# Patient Record
Sex: Male | Born: 1980
Health system: Southern US, Community
[De-identification: ages and names within clinical notes are randomized; demographics above are authoritative.]

## PROBLEM LIST (undated history)

## (undated) DIAGNOSIS — B20 Human immunodeficiency virus [HIV] disease: Secondary | ICD-10-CM

## (undated) DIAGNOSIS — B370 Candidal stomatitis: Secondary | ICD-10-CM

## (undated) DIAGNOSIS — B029 Zoster without complications: Secondary | ICD-10-CM

## (undated) DIAGNOSIS — R21 Rash and other nonspecific skin eruption: Secondary | ICD-10-CM

## (undated) DIAGNOSIS — J439 Emphysema, unspecified: Secondary | ICD-10-CM

## (undated) DIAGNOSIS — F17200 Nicotine dependence, unspecified, uncomplicated: Secondary | ICD-10-CM

## (undated) HISTORY — DX: Human immunodeficiency virus (HIV) disease: B20

## (undated) HISTORY — DX: Zoster without complications: B02.9

## (undated) HISTORY — DX: Nicotine dependence, unspecified, uncomplicated: F17.200

## (undated) HISTORY — DX: Rash and other nonspecific skin eruption: R21

## (undated) HISTORY — DX: Emphysema, unspecified: J43.9

## (undated) HISTORY — DX: Candidal stomatitis: B37.0

---

## 2006-12-19 ENCOUNTER — Emergency Department (HOSPITAL_COMMUNITY): Admission: EM | Admit: 2006-12-19 | Discharge: 2006-12-19 | Payer: Self-pay | Admitting: Emergency Medicine

## 2007-01-07 ENCOUNTER — Emergency Department (HOSPITAL_COMMUNITY): Admission: EM | Admit: 2007-01-07 | Discharge: 2007-01-07 | Payer: Self-pay | Admitting: Emergency Medicine

## 2007-01-10 ENCOUNTER — Emergency Department (HOSPITAL_COMMUNITY): Admission: EM | Admit: 2007-01-10 | Discharge: 2007-01-10 | Payer: Self-pay | Admitting: *Deleted

## 2007-09-11 ENCOUNTER — Emergency Department (HOSPITAL_COMMUNITY): Admission: EM | Admit: 2007-09-11 | Discharge: 2007-09-11 | Payer: Self-pay | Admitting: Emergency Medicine

## 2010-02-16 ENCOUNTER — Encounter: Payer: Self-pay | Admitting: Internal Medicine

## 2010-02-16 LAB — CONVERTED CEMR LAB
CD4 T Helper %: 16.4 %
Hemoglobin: 14.4 g/dL
Platelets: 170 10*3/uL
WBC: 5.3 10*3/uL

## 2010-03-06 ENCOUNTER — Telehealth: Payer: Self-pay

## 2010-03-06 DIAGNOSIS — B20 Human immunodeficiency virus [HIV] disease: Secondary | ICD-10-CM | POA: Insufficient documentation

## 2010-03-13 ENCOUNTER — Encounter (INDEPENDENT_AMBULATORY_CARE_PROVIDER_SITE_OTHER): Payer: Self-pay | Admitting: *Deleted

## 2010-03-13 ENCOUNTER — Ambulatory Visit: Payer: Self-pay | Admitting: Internal Medicine

## 2010-03-13 LAB — CONVERTED CEMR LAB
ALT: 14 units/L (ref 0–53)
AST: 22 units/L (ref 0–37)
CO2: 20 meq/L (ref 19–32)
Calcium: 9.4 mg/dL (ref 8.4–10.5)
Chloride: 107 meq/L (ref 96–112)
Creatinine, Ser: 1 mg/dL (ref 0.40–1.50)
Eosinophils Absolute: 0.1 10*3/uL (ref 0.0–0.7)
HIV 1 RNA Quant: 18700 copies/mL — ABNORMAL HIGH (ref <48)
HIV-1 antibody: POSITIVE — AB
HIV-2 Ab: NEGATIVE
Hep A Total Ab: NEGATIVE
Hepatitis B Surface Ag: NEGATIVE
Lymphocytes Relative: 47 % — ABNORMAL HIGH (ref 12–46)
Lymphs Abs: 2.3 10*3/uL (ref 0.7–4.0)
MCV: 86.7 fL (ref 78.0–100.0)
Monocytes Relative: 9 % (ref 3–12)
Neutro Abs: 2 10*3/uL (ref 1.7–7.7)
Neutrophils Relative %: 41 % — ABNORMAL LOW (ref 43–77)
Platelets: 191 10*3/uL (ref 150–400)
Potassium: 4.3 meq/L (ref 3.5–5.3)
RBC: 4.75 M/uL (ref 4.22–5.81)
Sodium: 139 meq/L (ref 135–145)
Total Protein: 7.9 g/dL (ref 6.0–8.3)
WBC: 4.9 10*3/uL (ref 4.0–10.5)

## 2010-03-28 ENCOUNTER — Ambulatory Visit: Payer: Self-pay | Admitting: Internal Medicine

## 2010-06-05 ENCOUNTER — Ambulatory Visit: Payer: Self-pay | Admitting: Internal Medicine

## 2010-06-05 LAB — CONVERTED CEMR LAB
Bilirubin Urine: NEGATIVE
Casts: NONE SEEN /lpf
Specific Gravity, Urine: 1.041 — ABNORMAL HIGH (ref 1.005–1.030)
Squamous Epithelial / LPF: NONE SEEN /lpf
Urine Glucose: NEGATIVE mg/dL
Urobilinogen, UA: 0.2 (ref 0.0–1.0)
pH: 5.5 (ref 5.0–8.0)

## 2010-06-06 ENCOUNTER — Encounter: Payer: Self-pay | Admitting: Internal Medicine

## 2010-06-06 LAB — CONVERTED CEMR LAB
ALT: 13 units/L (ref 0–53)
AST: 21 units/L (ref 0–37)
Alkaline Phosphatase: 92 units/L (ref 39–117)
Basophils Absolute: 0 10*3/uL (ref 0.0–0.1)
Basophils Relative: 0 % (ref 0–1)
Creatinine, Ser: 1.21 mg/dL (ref 0.40–1.50)
Eosinophils Relative: 1 % (ref 0–5)
HCT: 38.4 % — ABNORMAL LOW (ref 39.0–52.0)
Hemoglobin: 13.3 g/dL (ref 13.0–17.0)
MCHC: 34.6 g/dL (ref 30.0–36.0)
Monocytes Absolute: 0.8 10*3/uL (ref 0.1–1.0)
Platelets: 180 10*3/uL (ref 150–400)
RDW: 14.9 % (ref 11.5–15.5)
Sodium: 126 meq/L — ABNORMAL LOW (ref 135–145)
Total Bilirubin: 0.4 mg/dL (ref 0.3–1.2)
Total Protein: 7.3 g/dL (ref 6.0–8.3)

## 2010-06-07 ENCOUNTER — Ambulatory Visit: Payer: Self-pay | Admitting: Internal Medicine

## 2010-06-07 LAB — CONVERTED CEMR LAB
BUN: 9 mg/dL (ref 6–23)
CO2: 23 meq/L (ref 19–32)
Calcium: 8.8 mg/dL (ref 8.4–10.5)
Glucose, Bld: 112 mg/dL — ABNORMAL HIGH (ref 70–99)
Sodium: 139 meq/L (ref 135–145)

## 2010-06-21 ENCOUNTER — Ambulatory Visit: Payer: Self-pay | Admitting: Internal Medicine

## 2010-07-02 ENCOUNTER — Telehealth (INDEPENDENT_AMBULATORY_CARE_PROVIDER_SITE_OTHER): Payer: Self-pay | Admitting: *Deleted

## 2010-08-23 ENCOUNTER — Telehealth (INDEPENDENT_AMBULATORY_CARE_PROVIDER_SITE_OTHER): Payer: Self-pay | Admitting: *Deleted

## 2010-09-04 ENCOUNTER — Ambulatory Visit: Payer: Self-pay | Admitting: Internal Medicine

## 2010-09-05 ENCOUNTER — Encounter: Payer: Self-pay | Admitting: Internal Medicine

## 2010-09-05 LAB — CONVERTED CEMR LAB
ALT: 14 units/L (ref 0–53)
CO2: 25 meq/L (ref 19–32)
Creatinine, Ser: 1.09 mg/dL (ref 0.40–1.50)
Eosinophils Absolute: 0 10*3/uL (ref 0.0–0.7)
Eosinophils Relative: 1 % (ref 0–5)
HCT: 40 % (ref 39.0–52.0)
Lymphs Abs: 1.7 10*3/uL (ref 0.7–4.0)
MCV: 85.8 fL (ref 78.0–100.0)
Monocytes Relative: 8 % (ref 3–12)
Platelets: 185 10*3/uL (ref 150–400)
RBC: 4.66 M/uL (ref 4.22–5.81)
Total Bilirubin: 0.3 mg/dL (ref 0.3–1.2)
WBC: 4 10*3/uL (ref 4.0–10.5)

## 2010-09-18 ENCOUNTER — Ambulatory Visit
Admission: RE | Admit: 2010-09-18 | Discharge: 2010-09-18 | Payer: Self-pay | Source: Home / Self Care | Attending: Internal Medicine | Admitting: Internal Medicine

## 2010-10-09 NOTE — Progress Notes (Signed)
Summary: pt calling to reschedule intake and OV  Phone Note Outgoing Call   Call placed by: Tomasita Morrow RN,  March 06, 2010 11:45 AM Summary of Call: [facility] Referral. Pt missed Intake Appt scheduled for 03-06-10.    Pt called and requested reschedule.  Tomasita Morrow RN  March 08, 2010 10:17 AM   Follow-up for Phone Call        Rescheduled for 03-26-10 @ 2pm with K. Vollmer  03-13-10 for intake @ 11:00 am  Tomasita Morrow RN  March 08, 2010 4:12 PM   New Problems: HIV INFECTION (ICD-042)   New Problems: HIV INFECTION (ICD-042)

## 2010-10-09 NOTE — Miscellaneous (Signed)
Summary: clinical update/ryan white [place]ADAP partially compl pend. labs  Clinical Lists Changes  Observations: Added new observation of RWTITLE: B (03/13/2010 13:48) Added new observation of PAYOR: No Insurance (03/13/2010 13:48) Added new observation of AIDSDAP: Pending (03/13/2010 13:48) Added new observation of PCTFPL: 212.74  (03/13/2010 13:48) Added new observation of INCOMESOURCE: wages  (03/13/2010 13:48) Added new observation of HOUSEINCOME: 53664  (03/13/2010 13:48) Added new observation of #CHILD<18 IN: No  (03/13/2010 13:48) Added new observation of FAMILYSIZE: 1  (03/13/2010 13:48) Added new observation of HOUSING: Stable/permanent  (03/13/2010 13:48) Added new observation of FINASSESSDT: 03/13/2010  (03/13/2010 13:48) Added new observation of YEARLYEXPEN: 2800  (03/13/2010 13:48) Added new observation of MARITAL STAT: Single  (03/13/2010 13:48) Added new observation of LATINO/HISP: No  (03/13/2010 13:48) Added new observation of RACE: African American  (03/13/2010 13:48) Added new observation of REC_MESSAGE: Yes  (03/13/2010 13:48) Added new observation of RECPHONECALL: Yes  (03/13/2010 13:48) Added new observation of REC_MAIL: Yes  (03/13/2010 13:48) Added new observation of RW VITAL STA: Active  (03/13/2010 13:48) Added new observation of PATNTCOUNTY: [place]  (03/13/2010 13:48) Added new observation of RWPARTICIP: Yes  (03/13/2010 13:48)

## 2010-10-09 NOTE — Progress Notes (Signed)
Summary: [facility] pt. assistance med. arrived  Phone Note Outgoing Call   Call placed by: Annice Pih Summary of Call: Pt. notified that meds. have arrived from [place] pt. assistance program, Atripla for October. Initial call taken by: Wendall Mola CMA Duncan Dull),  July 02, 2010 9:16 AM

## 2010-10-09 NOTE — Miscellaneous (Signed)
Summary: Orders Update  Clinical Lists Changes  Orders: Added new Test order of T-Comprehensive Metabolic Panel [id number]) - Signed     Process Orders Check Orders Results:     [facility]: ABN not required for this insurance Tests Sent for requisitioning (June 06, 2010 12:01 PM):     06/06/2010: [facility] -- T-Comprehensive Metabolic Panel [phone] (signed)   Appended Document: Orders Update order changed to BMP

## 2010-10-09 NOTE — Assessment & Plan Note (Signed)
Summary: New  042    CC:  new patient to establish, lab results, c/o sorethroat off and on, and has had blood in saliva.  History of Present Illness: This is the first ID clinic visit for Jeremy Harper.  He was diagnosed HIV (+) 5/11.  His last negative test was in 2006.  His risk factor is heterosexual intercourse.  He is currently asymptomatic.  His current partner is HIV negative. He does c/o an intermittant sore throat. No fever or cough.  Preventive Screening-Counseling & Management  Alcohol-Tobacco     Alcohol drinks/day: occasionally     Smoking Status: current     Smoking Cessation Counseling: yes     Packs/Day: <0.25  Caffeine-Diet-Exercise     Caffeine use/day: occasional 2 per day     Does Patient Exercise: yes     Type of exercise: at work  Hep-HIV-STD-Contraception     Dental Visit-last 6 months no  Safety-Violence-Falls     Seat Belt Use: yes      Sexual History:  dating.        Drug Use:  current and occasional marijuana.    Comments: pt. declined condoms   Current Allergies (reviewed today): No known allergies  Social History: Drug Use:  current, occasional marijuana Sexual History:  dating  Review of Systems  The patient denies anorexia, fever, weight loss, prolonged cough, and headaches.    Vital Signs:  Patient profile:   30 year old male Height:      69 inches (175.26 cm) Weight:      171.8 pounds (78.09 kg) BMI:     25.46 Temp:     97.6 degrees F (36.44 degrees C) oral Pulse rate:   81 / minute BP sitting:   117 / 69  (right arm)  Vitals Entered By: Wendall Mola CMA Duncan Dull) (March 28, 2010 11:19 AM) CC: new patient to establish, lab results, c/o sorethroat off and on, has had blood in saliva Is Patient Diabetic? No Pain Assessment Patient in pain? no      Nutritional Status BMI of 25 - 29 = overweight Nutritional Status Detail appetite "normal"  Have you ever been in a relationship where you felt threatened, hurt or afraid?No     Does patient need assistance? Functional Status Self care Ambulation Normal   Physical Exam  General:  alert, well-developed, well-nourished, and well-hydrated.   Head:  normocephalic and atraumatic.   Mouth:  pharynx pink and moist, no erythema, and no exudates.   Neck:  no cervical lymphadenopathy.   Lungs:  normal breath sounds.   Heart:  normal rate and regular rhythm.     Impression & Recommendations:  Problem # 1:  HIV INFECTION (ICD-042) Discussed pathophysiology of HIV and the meaning of CD4ct and VL.  Pt.s current Cd4ct is 400  and VL is  18,700 with no resistance seen on genotype .  At this Point antiretroviral treatment is indicated . Discussed potential treatments.  Pt would like to start Atripla.  Potential side effects were discussed. I will refer him to Byrd Hesselbach to get enrolled in ADAP.  He will f/u about 6 weeks after starting meds for labs. I discussed the need to take his meds every day. Pt referred to [facility].  Discussed safe sex and transmisiion routes with the patient. Condoms offered.  Orders: New Patient Level III (99203)Future Orders: T-CBC w/Diff ([id number]) ... 05/29/2010 T-CD4SP ([facility]) (CD4SP) ... 05/29/2010 T-Comprehensive Metabolic Panel [phone]) ... 05/29/2010 T-HIV Viral Load [phone]) .Marland KitchenMarland Kitchen  05/29/2010  Medications Added to Medication List This Visit: 1)  Atripla 600-200-300 Mg Tabs (Efavirenz-emtricitab-tenofovir) .... Take 1 tablet by mouth at bedtime  Patient Instructions: 1)  Please schedule a follow-up appointment in 10 weeks, 2 weeks after labs.

## 2010-10-09 NOTE — Assessment & Plan Note (Signed)
Summary: NEW 042/TKK    Infectious Disease New Patient Intake Referring MD/Agency: [facility] Dept  Address: [address] [place], [place] [address]  Medical Records: Received Health Insurance / Payor: No Insurance Employer: [employer]       Do you have a Primary physician: No Are family members aware of patient's diagnosis?  If so, are they supportive? Mother is aware and somewhat supportive Describe patient's current social support (family, friends, support groups): Pt has not told any friends  Medical History Medical Problems OTHER than HIV: No   Medication Allergies: No   Medications: No    Family History Hypertension: Yes  Family Side: Paternal  Comments: stroke in 64's  Tobacco use: current Amt: 1/2 per week. Counseled to quit/cut down: yes  Behavioral Health Assessment Have you ever been diagnosed with depression or mental illness? No  Do you drink alcohol? Yes Frequency: occasionally Alcohol Beverage Type(s): beer  Do you use recreational drugs? Yes Last Date of Consumption: 02/07/2010 Frequency: rarely  Drugs: marijuana   once monthly  Do you feel you have a problem with drugs and/or alcohol? No   Have you ever been in a treatment facility for any addiction? No If you are currently using drugs and/or alcohol, would you like help for this addiction? No  HIV Intake Information When did you first test positive for HIV? 01/30/2010 Type of test Conducted: WB    Where was this test performed?  Name of Agency: [facility]   City/State: [place] , [place]  Was this your first time ever being tested or HIV? No   LAST negative HIV test result: 2006 Name of Office: [facility] Dept    City/state: [name],    Risk Factor(s) for HIV: Heterosexual contact  Method of Exposure to HIV: Heterosexual Intercourse Have you ever been hospitalized for any HIV-related condition? No  Have you ever been under the care of a  physician for being HIV positive? No  Newly Diagnosed Patients Has a Disease Intervention Specialist from the Health Department contacted the patient? Yes.   The patient has been informed that the [facility] Department will contact ALL newly reported cases. Health Department Contact:  [phone]   (SSN is needed for confirmation)  Reported Today: 03/13/2010 Health Department Contact:  [phone]            (SSN is needed for confirmation)  Person Reporting: prev/reported GHD Do you have any Non-HIV related medical conditions or other prior hospitalizations or surgeries? No  HIV Medications Information The patient is currently NOT taking any HIV medications.  Infection History  Patient has been diagnosed with the following opportunistic infections: Are there any other symptoms you need to discuss? No Have you received literature/education prior to this visit about HIV/AIDS? Yes Do you understand the meaning of a Viral Load? No Do you understand the meaning of a CD4 count? No Initial CD4 Result: 377 Date: 02/16/2010  Sexual History Are you in a current relationship? No Are you currently sexually active? Yes Was this protected intercourse? No Safe Sex Counseling/Pamphlet Given Sexual History Comments: Pt states he has been with 3 male partners within the last year. None of the three where sex workers . Each of those have been tested and are negative. He suspects he may have been infected by a previous partner in [place] over a year ago.  Evaluation and Follow-Up INTAKE CHECK LIST: HIV Education, Safe Sex Counseling, HIV Material Given,  Juanell Fairly Consent  Prevention For Positives: 03/13/2010   Safe sex practices discussed with patient. Condoms offered. Juanell Fairly Consent: Yes Are you in need of condoms at this time? No Our patient has been informed that condoms are always available in this clinic.   Brochure Provided for Above Organizations? Yes Name of Agency: [facility] Does patient  have problems that warrant Social Worker referral? No             Prevention For Positives: 03/13/2010   Safe sex practices discussed with patient. Condoms offered.                             Immunization History:  Hepatitis B Immunization History:    Hepatitis B # 1:  historical (07/25/1994)    Hepatitis B # 2:  historical (08/23/1994)    Hepatitis B # 3:  historical (02/07/1995)  DPT Immunization History:    DPT # 1:  historical (01/17/1988)  Polio Immunization History:    Polio # 1:  historical (01/17/1988)  MMR Immunization History:    MMR # 1:  historical (04/16/1994)  Tetanus/Td Immunization History:    Tetanus/Td:  historical (11/09/1997)    Tetanus/Td:  historical (02/16/2010)  Influenza Immunization History:    Influenza:  historical (02/16/2010)  Pneumovax Immunization History:    Pneumovax:  historical (02/16/2010)  PPD Results    Date of reading: 02/19/2010    Results: < 5mm    Interpretation: negative    -  Date:  02/16/2010    CD4: 377    CD4%: 16.4    Hemoglobin: 14.4    WBC: 5.3    Platelets: 170

## 2010-10-09 NOTE — Assessment & Plan Note (Signed)
Summary: F/U/VS   CC:  follow-up visit, lab results, pt. never started Atripla, and received a letter that he was on ADAP wait list.  History of Present Illness: Pt never started his Atripla because he was placed on the ADAP wait list and was unable to get it. Yesterday he was layed off from work.  Preventive Screening-Counseling & Management  Alcohol-Tobacco     Alcohol drinks/day: occasionally     Smoking Status: current     Smoking Cessation Counseling: yes     Packs/Day: <0.25  Caffeine-Diet-Exercise     Caffeine use/day: occasional 2 per day     Does Patient Exercise: yes     Type of exercise: at work  Hep-HIV-STD-Contraception     Dental Visit-last 6 months no  Safety-Violence-Falls     Seat Belt Use: yes      Sexual History:  dating.        Drug Use:  current and occasional marijuana.    Comments: pt. declined condoms   Current Allergies (reviewed today): No known allergies  Review of Systems  The patient denies anorexia, fever, and weight loss.    Vital Signs:  Patient profile:   30 year old male Height:      69 inches (175.26 cm) Weight:      165.2 pounds (75.09 kg) BMI:     24.48 Temp:     98.2 degrees F (36.78 degrees C) oral Pulse rate:   76 / minute BP sitting:   121 / 73  (left arm)  Vitals Entered By: Wendall Mola CMA Duncan Dull) (June 21, 2010 3:58 PM) CC: follow-up visit, lab results, pt. never started Atripla, received a letter that he was on ADAP wait list Is Patient Diabetic? No Pain Assessment Patient in pain? no      Nutritional Status BMI of 19 -24 = normal Nutritional Status Detail appetite "low"  Does patient need assistance? Functional Status Self care Ambulation Normal   Physical Exam  General:  alert, well-developed, well-nourished, and well-hydrated.   Head:  normocephalic and atraumatic.   Mouth:  pharynx pink and moist.  no thrush  Lungs:  normal breath sounds.     Impression &  Recommendations:  Problem # 1:  HIV INFECTION (ICD-042) I will have pt meet with our financial counselor to get a resource to pay for his Atripla.  Pt will call for a lab appt 6 weeks after starting meds. Diagnostics Reviewed:  HIV: REACTIVE (03/13/2010)   HIV-Western blot: Positive (03/13/2010)   CD4: 420 (06/06/2010)   WBC: 6.7 (06/05/2010)   Hgb: 13.3 (06/05/2010)   HCT: 38.4 (06/05/2010)   Platelets: 180 (06/05/2010) HIV genotype: See Comment (03/13/2010)   HIV-1 RNA: 47425 (06/05/2010)   HBSAg: NEG (03/13/2010)  Patient Instructions: 1)  call for follow-up lab appt 6 weeks after starting meds.

## 2010-10-11 NOTE — Assessment & Plan Note (Signed)
Summary: followup on labs/jc   CC:  follow-up visit, lab results, and c/o off and on chest pain.  History of Present Illness: patient is here for follow up his labs.  He states when he initially started as [place] made him very sleepy for almost 24 hours.  Recently he has adjusted to the medication and it is no longer sleepy when he wakes up in the morning.  He does have trouble sleeping and is a long-standing problem.  He does not want to try anything for it at this time.  He is concerned because his work schedule has shifted to where he will be working third shift.  He does not know how the medication will affect his ability to work.  We discussed the fact that he has other options if atripla interferes with his work schedule.  He will call this in fact is the case.  Preventive Screening-Counseling & Management  Alcohol-Tobacco     Alcohol drinks/day: occasionally     Smoking Status: current     Smoking Cessation Counseling: yes     Packs/Day: <0.25  Caffeine-Diet-Exercise     Caffeine use/day: occasional 2 per day     Does Patient Exercise: yes     Type of exercise: at work  Hep-HIV-STD-Contraception     Dental Visit-last 6 months no  Safety-Violence-Falls     Seat Belt Use: yes      Sexual History:  dating.        Drug Use:  current and occasional marijuana.    Comments: pt. given condoms   Updated Prior Medication List: ATRIPLA 600-200-300 MG TABS (EFAVIRENZ-EMTRICITAB-TENOFOVIR) Take 1 tablet by mouth at bedtime  Current Allergies (reviewed today): No known allergies  Review of Systems  The patient denies anorexia, fever, and weight loss.    Vital Signs:  Patient profile:   30 year old male Height:      69 inches (175.26 cm) Weight:      166.8 pounds (75.82 kg) BMI:     24.72 Temp:     97.4 degrees F (36.33 degrees C) oral Pulse rate:   78 / minute BP sitting:   134 / 80  (right arm)  Vitals Entered By: Wendall Mola CMA Duncan Dull) (September 18, 2010  2:38 PM) CC: follow-up visit, lab results, c/o off and on chest pain Is Patient Diabetic? No Pain Assessment Patient in pain? no      Nutritional Status BMI of 19 -24 = normal Nutritional Status Detail appetite "lower"  Have you ever been in a relationship where you felt threatened, hurt or afraid?No   Does patient need assistance? Functional Status Self care Ambulation Normal Comments pt. missed three doses of med. due to problems getting refills   Physical Exam  General:  alert, well-developed, well-nourished, and well-hydrated.   Head:  normocephalic and atraumatic.   Mouth:  pharynx pink and moist.  no thrush  Lungs:  normal breath sounds.     Impression & Recommendations:  Problem # 1:  HIV INFECTION (ICD-042) Pt.s most recent CD4ct was 390 and VL <20 .  Pt instructed to continue the current antiretroviral regimen.  Pt encouraged to take medication regularly and not miss doses.  Pt will f/u in 3 months for repeat blood work and will see me 2 weeks later. He will call if the Atripla interferes with his work.  Diagnostics Reviewed:  HIV: REACTIVE (03/13/2010)   HIV-Western blot: Positive (03/13/2010)   CD4: 390 (09/06/2010)   WBC: 4.0 (  09/05/2010)   Hgb: 14.2 (09/05/2010)   HCT: 40.0 (09/05/2010)   Platelets: 185 (09/05/2010) HIV genotype: See Comment (03/13/2010)   HIV-1 RNA: <20 copies/mL (09/05/2010)   HBSAg: NEG (03/13/2010)  Orders: Est. Patient Level III (99213)Future Orders: T-CD4SP ([facility]) (CD4SP) ... 12/17/2010 T-HIV Viral Load [id number]) ... 12/17/2010 T-Comprehensive Metabolic Panel [id number]) ... 12/17/2010 T-CBC w/Diff ([id number]) ... 12/17/2010  Other Orders: Influenza Vaccine NON MCR ([id number])  Patient Instructions: 1)  Please schedule a follow-up appointment in 3 months, 2 weeks after labs.         Medication Adherence: 09/18/2010   Adherence to medications reviewed with patient. Counseling to provide adequate adherence provided                                   Immunizations Administered:  Influenza Vaccine # 1:    Vaccine Type: Fluvax Non-MCR    Site: right deltoid    Mfr: Novartis    Dose: 0.5 ml    Route: IM    Given by: Wendall Mola CMA ( AAMA)    Exp. Date: 12/09/2010    Lot #: [id number]    VIS given: 04/03/10 version given September 18, 2010.  Flu Vaccine Consent Questions:    Do you have a history of severe allergic reactions to this vaccine? no    Any prior history of allergic reactions to egg and/or gelatin? no    Do you have a sensitivity to the preservative Thimersol? no    Do you have a past history of Guillan-Barre Syndrome? no    Do you currently have an acute febrile illness? no    Have you ever had a severe reaction to latex? no    Vaccine information given and explained to patient? yes

## 2010-10-11 NOTE — Miscellaneous (Signed)
Summary: Orders Update  Clinical Lists Changes  Orders: Added new Test order of T-CBC w/Diff [phone]) - Signed Added new Test order of T-CD4SP [facility] (CD4SP) - Signed Added new Test order of T-Comprehensive Metabolic Panel [id number]) - Signed Added new Test order of T-HIV Viral Load [id number]) - Signed     Process Orders Check Orders Results:     [facility]: ABN not required for this insurance Tests Sent for requisitioning (September 09, 2010 5:50 PM):     09/05/2010: [facility] -- T-CBC w/Diff [[id number]] (signed)     09/05/2010: [facility] -- T-Comprehensive Metabolic Panel [[id number]] (signed)     09/05/2010: [facility] -- T-HIV Viral Load [phone] (signed)

## 2010-10-11 NOTE — Progress Notes (Signed)
Summary: refill meds, appt. scheduled  Phone Note Call from Patient   Caller: Patient Summary of Call: pt. called his is almost out of Atripla, he did not start it until 07/24/10.  Called [place] to refill meds.  Scheduled pt. lab appt. for 09/04/10 and followup with Dr. Philipp Deputy for 09/18/10 Initial call taken by: Wendall Mola CMA Duncan Dull),  August 23, 2010 12:03 PM

## 2010-11-22 LAB — T-HELPER CELL (CD4) - (RCID CLINIC ONLY)
CD4 % Helper T Cell: 17 % — ABNORMAL LOW (ref 33–55)
CD4 T Cell Abs: 420 uL (ref 400–2700)

## 2010-11-25 LAB — T-HELPER CELL (CD4) - (RCID CLINIC ONLY): CD4 % Helper T Cell: 19 % — ABNORMAL LOW (ref 33–55)

## 2010-12-17 ENCOUNTER — Other Ambulatory Visit (INDEPENDENT_AMBULATORY_CARE_PROVIDER_SITE_OTHER): Payer: Self-pay

## 2010-12-17 DIAGNOSIS — B20 Human immunodeficiency virus [HIV] disease: Secondary | ICD-10-CM

## 2010-12-18 LAB — CBC WITH DIFFERENTIAL/PLATELET
Eosinophils Relative: 1 % (ref 0–5)
HCT: 42.9 % (ref 39.0–52.0)
Lymphocytes Relative: 52 % — ABNORMAL HIGH (ref 12–46)
Lymphs Abs: 1.9 10*3/uL (ref 0.7–4.0)
MCV: 91.7 fL (ref 78.0–100.0)
Neutro Abs: 1.3 10*3/uL — ABNORMAL LOW (ref 1.7–7.7)
Platelets: 172 10*3/uL (ref 150–400)
RBC: 4.68 MIL/uL (ref 4.22–5.81)
WBC: 3.6 10*3/uL — ABNORMAL LOW (ref 4.0–10.5)

## 2010-12-18 LAB — T-HELPER CELL (CD4) - (RCID CLINIC ONLY): CD4 % Helper T Cell: 24 % — ABNORMAL LOW (ref 33–55)

## 2010-12-18 LAB — COMPLETE METABOLIC PANEL WITH GFR
AST: 19 U/L (ref 0–37)
Alkaline Phosphatase: 119 U/L — ABNORMAL HIGH (ref 39–117)
BUN: 10 mg/dL (ref 6–23)
Calcium: 9.4 mg/dL (ref 8.4–10.5)
Creat: 1.08 mg/dL (ref 0.40–1.50)

## 2010-12-18 LAB — HIV-1 RNA QUANT-NO REFLEX-BLD: HIV 1 RNA Quant: <20 copies/mL (ref <20)

## 2010-12-31 ENCOUNTER — Encounter: Payer: Self-pay | Admitting: Adult Health

## 2010-12-31 ENCOUNTER — Ambulatory Visit (INDEPENDENT_AMBULATORY_CARE_PROVIDER_SITE_OTHER): Payer: Self-pay | Admitting: Adult Health

## 2010-12-31 ENCOUNTER — Ambulatory Visit: Payer: Self-pay | Admitting: Internal Medicine

## 2010-12-31 DIAGNOSIS — Z79899 Other long term (current) drug therapy: Secondary | ICD-10-CM

## 2010-12-31 DIAGNOSIS — B20 Human immunodeficiency virus [HIV] disease: Secondary | ICD-10-CM

## 2011-01-07 NOTE — Progress Notes (Signed)
  Subjective:    Patient ID: Jeremy Harper, male    DOB: [birth year], 30 y.o.   MRN: [id number]  HPI doing well. Voices no complaints at present. Adherent with his antiretroviral medications with good tolerance. Denies any illnesses or problems since last visit.    Review of Systems  Constitutional: Negative.  Negative for fever, chills, diaphoresis, activity change, appetite change, fatigue and unexpected weight change.  HENT: Negative.  Negative for hearing loss, facial swelling, neck pain, neck stiffness and ear discharge.   Eyes: Negative.   Respiratory: Negative.   Cardiovascular: Negative.   Gastrointestinal: Negative.   Genitourinary: Negative.   Musculoskeletal: Negative.   Skin: Negative.   Neurological: Negative.   Hematological: Negative.   Psychiatric/Behavioral: Negative.        Objective:   Physical Exam  Constitutional: He is oriented to person, place, and time. He appears well-developed and well-nourished. No distress.  HENT:  Head: Normocephalic and atraumatic.  Right Ear: External ear normal.  Left Ear: External ear normal.  Nose: Nose normal.  Mouth/Throat: Oropharynx is clear and moist. No oropharyngeal exudate.  Eyes: Conjunctivae and EOM are normal. Pupils are equal, round, and reactive to light. Right eye exhibits no discharge. Left eye exhibits no discharge.  Neck: Normal range of motion. Neck supple. No JVD present. No tracheal deviation present. No thyromegaly present.  Cardiovascular: Normal rate, regular rhythm, normal heart sounds and intact distal pulses.  Exam reveals no gallop and no friction rub.   No murmur heard. Pulmonary/Chest: Effort normal and breath sounds normal. No stridor. No respiratory distress. He has no wheezes. He has no rales. He exhibits no tenderness.  Abdominal: Soft. Bowel sounds are normal. He exhibits no distension and no mass. There is no tenderness. There is no rebound and no guarding.  Musculoskeletal: Normal range of  motion. He exhibits no edema and no tenderness.  Lymphadenopathy:    He has no cervical adenopathy.  Neurological: He is alert and oriented to person, place, and time. No cranial nerve deficit. He exhibits normal muscle tone. Coordination normal.  Skin: Skin is warm and dry. No rash noted. No erythema. No pallor.  Psychiatric: He has a normal mood and affect. His behavior is normal. Judgment and thought content normal.          Assessment & Plan:  1. HIV. CD4 count is 480 at 24% with a viral load of less than 20 copies per mL. Clinically stable on his current regimen. Recommend continuing present management with a followup in approximately 4 months and repeat labs 2 weeks prior to his next visit. Verbally acknowledged this information and agreed with plan of care.

## 2011-04-24 DIAGNOSIS — B20 Human immunodeficiency virus [HIV] disease: Secondary | ICD-10-CM

## 2011-04-24 DIAGNOSIS — Z79899 Other long term (current) drug therapy: Secondary | ICD-10-CM

## 2011-04-25 ENCOUNTER — Other Ambulatory Visit: Payer: Self-pay | Admitting: Infectious Diseases

## 2011-04-25 ENCOUNTER — Other Ambulatory Visit (INDEPENDENT_AMBULATORY_CARE_PROVIDER_SITE_OTHER): Payer: Self-pay

## 2011-04-25 DIAGNOSIS — B20 Human immunodeficiency virus [HIV] disease: Secondary | ICD-10-CM

## 2011-04-25 DIAGNOSIS — Z79899 Other long term (current) drug therapy: Secondary | ICD-10-CM

## 2011-04-25 LAB — COMPREHENSIVE METABOLIC PANEL
ALT: 13 U/L (ref 0–53)
AST: 18 U/L (ref 0–37)
Albumin: 3.9 g/dL (ref 3.5–5.2)
CO2: 23 mEq/L (ref 19–32)
Calcium: 9 mg/dL (ref 8.4–10.5)
Chloride: 109 mEq/L (ref 96–112)
Potassium: 4 mEq/L (ref 3.5–5.3)
Sodium: 137 mEq/L (ref 135–145)
Total Protein: 7.1 g/dL (ref 6.0–8.3)

## 2011-04-25 LAB — CBC WITH DIFFERENTIAL/PLATELET
Basophils Absolute: 0 10*3/uL (ref 0.0–0.1)
Eosinophils Relative: 2 % (ref 0–5)
Lymphocytes Relative: 45 % (ref 12–46)
Lymphs Abs: 2.2 10*3/uL (ref 0.7–4.0)
MCV: 90.7 fL (ref 78.0–100.0)
Neutro Abs: 2.2 10*3/uL (ref 1.7–7.7)
Neutrophils Relative %: 44 % (ref 43–77)
Platelets: 186 10*3/uL (ref 150–400)
RBC: 4.3 MIL/uL (ref 4.22–5.81)
WBC: 4.9 10*3/uL (ref 4.0–10.5)

## 2011-04-25 LAB — LIPID PANEL
LDL Cholesterol: 76 mg/dL (ref 0–99)
VLDL: 17 mg/dL (ref 0–40)

## 2011-04-26 LAB — T-HELPER CELL (CD4) - (RCID CLINIC ONLY): CD4 T Cell Abs: 610 uL (ref 400–2700)

## 2011-04-29 LAB — HIV-1 RNA QUANT-NO REFLEX-BLD: HIV 1 RNA Quant: <20 copies/mL (ref <20)

## 2011-05-09 ENCOUNTER — Encounter: Payer: Self-pay | Admitting: Adult Health

## 2011-05-09 ENCOUNTER — Ambulatory Visit (INDEPENDENT_AMBULATORY_CARE_PROVIDER_SITE_OTHER): Payer: Self-pay | Admitting: Adult Health

## 2011-05-09 ENCOUNTER — Ambulatory Visit: Payer: Self-pay

## 2011-05-09 VITALS — BP 126/74 | HR 76 | Temp 98.0°F | Ht 69.0 in | Wt 165.8 lb

## 2011-05-09 DIAGNOSIS — Z0000 Encounter for general adult medical examination without abnormal findings: Secondary | ICD-10-CM

## 2011-05-09 DIAGNOSIS — B20 Human immunodeficiency virus [HIV] disease: Secondary | ICD-10-CM

## 2011-05-09 DIAGNOSIS — Z23 Encounter for immunization: Secondary | ICD-10-CM

## 2011-05-09 NOTE — Patient Instructions (Signed)
Please followup with Jeremy Harper regarding your ADAP application.

## 2011-05-20 ENCOUNTER — Ambulatory Visit: Payer: Self-pay

## 2011-05-21 ENCOUNTER — Ambulatory Visit: Payer: Self-pay

## 2011-05-24 ENCOUNTER — Other Ambulatory Visit: Payer: Self-pay | Admitting: Licensed Clinical Social Worker

## 2011-05-24 ENCOUNTER — Ambulatory Visit: Payer: Self-pay

## 2011-05-24 DIAGNOSIS — B20 Human immunodeficiency virus [HIV] disease: Secondary | ICD-10-CM

## 2011-05-24 MED ORDER — EFAVIRENZ-EMTRICITAB-TENOFOVIR 600-200-300 MG PO TABS: 1.0000 | ORAL_TABLET | Freq: Every day | ORAL | Status: DC

## 2011-10-14 NOTE — Assessment & Plan Note (Addendum)
Clinically stable on current regimen. Continue present management.  Counseling provided on prevention of transmission of HIV. Condoms offered:  Yes Medication adherence discussed with patient.  Follow up visit in 4 months with labs 2 weeks prior to appointment. Patient acknowledged information provided to them and agreed with plan of care.    

## 2011-10-14 NOTE — Progress Notes (Signed)
Subjective:    Patient ID: Jeremy Harper is a 31 y.o. male.  Chief Complaint: HIV Follow-up Visit Jeremy Harper is here for follow-up of HIV infection. He is feeling unchanged since his last visit.  He claims continued adherence to therapy with good tolerance and no complications. There are not additional complaints.   Data Review: Diagnostic studies reviewed.  Review of Systems - General ROS: negative for - fatigue, fever or malaise Psychological ROS: negative for - anxiety, behavioral disorder, concentration difficulties, depression or mood swings Ophthalmic ROS: negative for - blurry vision, decreased vision or loss of vision ENT ROS: negative for - headaches, oral lesions or tinnitus Respiratory ROS: no cough, shortness of breath, or wheezing Cardiovascular ROS: no chest pain or dyspnea on exertion Gastrointestinal ROS: no abdominal pain, change in bowel habits, or black or bloody stools Neurological ROS: negative Dermatological ROS: negative for rash and skin lesion changes  Objective:   General appearance: alert, cooperative and no distress Head: Normocephalic, without obvious abnormality, atraumatic Eyes: conjunctivae/corneas clear. PERRL, EOM's intact. Fundi benign. Throat: lips, mucosa, and tongue normal; teeth and gums normal Resp: clear to auscultation bilaterally Cardio: regular rate and rhythm, S1, S2 normal, no murmur, click, rub or gallop GI: soft, non-tender; bowel sounds normal; no masses,  no organomegaly Skin: Skin color, texture, turgor normal. No rashes or lesions Neurologic: Alert and oriented X 3, normal strength and tone. Normal symmetric reflexes. Normal coordination and gait Psych:  No vegetative signs or delusional behaviors noted.    Laboratory: From 04/25/2011 ,  CD4 count was 610 c/cmm @ 25 %. Viral load <20 copies/ml.     Assessment/Plan:   HIV INFECTION Clinically stable on current regimen. Continue present management.  Counseling provided  on prevention of transmission of HIV. Condoms offered:  Yes Medication adherence discussed with patient.  Follow up visit in 4 months with labs 2 weeks prior to appointment. Patient acknowledged information provided to them and agreed with plan of care.      Jeremy Keady A. Sundra Aland, MS, [facility] [phone]  10/14/2011, 4:05 PM

## 2011-10-14 NOTE — Progress Notes (Deleted)
Subjective:    Patient ID: Jeremy Harper is a 31 y.o. male.  Chief Complaint: HIV Follow-up Visit Jeremy Harper is here for follow-up of HIV infection. He is feeling {course:18575} since his last visit.  {He/she (caps):30048} claims continued adherence to therapy with good tolerance and no complications. There {Actions; are/are not:16769} additional complaints. ***  Data Review: Diagnostic studies reviewed.  Review of Systems - {ros master:310782}  Objective:   {physical exam:3041130} Psych:  No vegetative signs or delusional behaviors noted.  ***  Laboratory: From *** ,  CD4 count was *** c/cmm @ ***%. Viral load *** copies/ml.     Assessment/Plan:   HIV INFECTION Clinically stable on current regimen. Continue present management.  Counseling provided on prevention of transmission of HIV. Condoms offered:  Yes Medication adherence discussed with patient.  Follow up visit in 4 months with labs 2 weeks prior to appointment. Patient acknowledged information provided to them and agreed with plan of care.      Cuauhtemoc Huegel A. Sundra Aland, MS, [facility] Infect[facility] Disease [phone]  10/14/2011, 2:16 PM

## 2011-11-12 ENCOUNTER — Telehealth: Payer: Self-pay

## 2011-11-12 NOTE — Telephone Encounter (Signed)
Called patient to remind him to renew ADAP - left message to make appt.

## 2015-10-13 DIAGNOSIS — R12 Heartburn: Secondary | ICD-10-CM | POA: Diagnosis not present

## 2015-10-13 DIAGNOSIS — L84 Corns and callosities: Secondary | ICD-10-CM | POA: Diagnosis not present

## 2015-10-13 DIAGNOSIS — R490 Dysphonia: Secondary | ICD-10-CM | POA: Diagnosis not present

## 2015-11-29 ENCOUNTER — Other Ambulatory Visit: Payer: 59

## 2015-11-29 DIAGNOSIS — B20 Human immunodeficiency virus [HIV] disease: Secondary | ICD-10-CM

## 2015-11-29 LAB — CBC WITH DIFFERENTIAL/PLATELET
Basophils Absolute: 0 10*3/uL (ref 0.0–0.1)
Basophils Relative: 0 % (ref 0–1)
Eosinophils Absolute: 0.2 10*3/uL (ref 0.0–0.7)
Eosinophils Relative: 5 % (ref 0–5)
HEMATOCRIT: 37.5 % — AB (ref 39.0–52.0)
HEMOGLOBIN: 12.5 g/dL — AB (ref 13.0–17.0)
LYMPHS ABS: 0.7 10*3/uL (ref 0.7–4.0)
Lymphocytes Relative: 23 % (ref 12–46)
MCH: 30.3 pg (ref 26.0–34.0)
MCHC: 33.3 g/dL (ref 30.0–36.0)
MCV: 91 fL (ref 78.0–100.0)
MONOS PCT: 11 % (ref 3–12)
MPV: 10.8 fL (ref 8.6–12.4)
Monocytes Absolute: 0.4 10*3/uL (ref 0.1–1.0)
NEUTROS ABS: 2 10*3/uL (ref 1.7–7.7)
NEUTROS PCT: 61 % (ref 43–77)
Platelets: 155 10*3/uL (ref 150–400)
RBC: 4.12 MIL/uL — ABNORMAL LOW (ref 4.22–5.81)
RDW: 13.5 % (ref 11.5–15.5)
WBC: 3.2 10*3/uL — ABNORMAL LOW (ref 4.0–10.5)

## 2015-11-29 LAB — COMPLETE METABOLIC PANEL WITH GFR
ALT: 21 U/L (ref 9–46)
AST: 22 U/L (ref 10–40)
Albumin: 4.1 g/dL (ref 3.6–5.1)
Alkaline Phosphatase: 82 U/L (ref 40–115)
BILIRUBIN TOTAL: 0.3 mg/dL (ref 0.2–1.2)
BUN: 12 mg/dL (ref 7–25)
CO2: 26 mmol/L (ref 20–31)
Calcium: 9.2 mg/dL (ref 8.6–10.3)
Chloride: 105 mmol/L (ref 98–110)
Creat: 0.95 mg/dL (ref 0.60–1.35)
GFR, Est African American: >89 mL/min (ref >=60)
GLUCOSE: 82 mg/dL (ref 65–99)
POTASSIUM: 4.2 mmol/L (ref 3.5–5.3)
SODIUM: 141 mmol/L (ref 135–146)
TOTAL PROTEIN: 7.8 g/dL (ref 6.1–8.1)

## 2015-11-30 LAB — URINE CYTOLOGY ANCILLARY ONLY
CHLAMYDIA, DNA PROBE: NEGATIVE
NEISSERIA GONORRHEA: NEGATIVE

## 2015-11-30 LAB — RPR

## 2015-12-01 ENCOUNTER — Ambulatory Visit (INDEPENDENT_AMBULATORY_CARE_PROVIDER_SITE_OTHER): Payer: 59

## 2015-12-01 ENCOUNTER — Ambulatory Visit (INDEPENDENT_AMBULATORY_CARE_PROVIDER_SITE_OTHER): Payer: 59 | Admitting: Podiatry

## 2015-12-01 ENCOUNTER — Telehealth: Payer: Self-pay | Admitting: *Deleted

## 2015-12-01 ENCOUNTER — Encounter: Payer: Self-pay | Admitting: Podiatry

## 2015-12-01 VITALS — BP 130/86 | HR 78 | Resp 12

## 2015-12-01 DIAGNOSIS — R52 Pain, unspecified: Secondary | ICD-10-CM

## 2015-12-01 DIAGNOSIS — L84 Corns and callosities: Secondary | ICD-10-CM | POA: Diagnosis not present

## 2015-12-01 DIAGNOSIS — M205X9 Other deformities of toe(s) (acquired), unspecified foot: Secondary | ICD-10-CM | POA: Diagnosis not present

## 2015-12-01 LAB — T-HELPER CELL (CD4) - (RCID CLINIC ONLY)
CD4 % Helper T Cell: <1 % — ABNORMAL LOW (ref 33–55)
CD4 T Cell Abs: <10 /uL — ABNORMAL LOW (ref 400–2700)

## 2015-12-01 NOTE — Progress Notes (Signed)
   Subjective:    Patient ID: Jeremy Harper, male    DOB: 07-29-1981, 35 y.o.   MRN: [id number]  HPI this patient presents to the office with chief complaint of painful calluses on the inside of his big toe, both feet. He says he has had callus for years and has self treated himself by trimming the calluses himself. He states the calluses are painful and developed related  to his activity.  He presents the office today to have these calluses evaluated and determine the reason for them developing. He presents the office for an evaluation and treatment Patient presents here with B/L toe pain with the left toe worse since years ago.   Review of Systems  All other systems reviewed and are negative.      Objective:   Physical Exam GENERAL APPEARANCE: Alert, conversant. Appropriately groomed. No acute distress.  VASCULAR: Pedal pulses are  palpable at  [facility] and PT bilateral.  Capillary refill time is immediate to all digits,  Normal temperature gradient.  Digital hair growth is present bilateral  NEUROLOGIC: sensation is normal to 5.07 monofilament at 5/5 sites bilateral.  Light touch is intact bilateral, Muscle strength normal.  MUSCULOSKELETAL: acceptable muscle strength, tone and stability bilateral.  Intrinsic muscluature intact bilateral.  Rectus appearance of foot and digits noted bilateral. Flexible pes planus both feet. Met. 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  No preulcerative lesions or ulcers  are seen, no interdigital maceration noted.  No open lesions present.  Digital nails are asymptomatic. No drainage noted. Callus medial aspect IPJ B/L.         Assessment & Plan:  Hallux limitus 1st MPJ B/L  Callus hallux B/l  IE  Xray revealing metatasal primus elevatus.  Debride callus B/L To consider powersteps.  Gardiner Barefoot DPM

## 2015-12-01 NOTE — Telephone Encounter (Signed)
Per message from Dr Ninetta LightsHatcher called the patient to try and get him in to see the doctor asap. Had to leave a message for him to call the office asap to schedule.   Per conversation with Tammy and Dr Orvan Falconerampbell he is opening available appt 12/07/15 and does not mind seeing he patient that day if he calls back.

## 2015-12-02 LAB — HIV RNA, RTPCR W/R GT (RTI, PI,INT)
HIV 1 RNA Quant: 86200 copies/mL — ABNORMAL HIGH
HIV-1 RNA Quant, Log: 4.94 Log copies/mL — ABNORMAL HIGH

## 2015-12-07 ENCOUNTER — Ambulatory Visit (INDEPENDENT_AMBULATORY_CARE_PROVIDER_SITE_OTHER): Payer: 59 | Admitting: Infectious Disease

## 2015-12-07 ENCOUNTER — Encounter: Payer: Self-pay | Admitting: Infectious Disease

## 2015-12-07 VITALS — BP 128/75 | HR 77 | Temp 98.3°F | Wt 166.0 lb

## 2015-12-07 DIAGNOSIS — B370 Candidal stomatitis: Secondary | ICD-10-CM

## 2015-12-07 DIAGNOSIS — B20 Human immunodeficiency virus [HIV] disease: Secondary | ICD-10-CM | POA: Diagnosis not present

## 2015-12-07 DIAGNOSIS — R21 Rash and other nonspecific skin eruption: Secondary | ICD-10-CM | POA: Diagnosis not present

## 2015-12-07 HISTORY — DX: Candidal stomatitis: B37.0

## 2015-12-07 HISTORY — DX: Human immunodeficiency virus (HIV) disease: B20

## 2015-12-07 HISTORY — DX: Rash and other nonspecific skin eruption: R21

## 2015-12-07 MED ORDER — SULFAMETHOXAZOLE-TRIMETHOPRIM 800-160 MG PO TABS: 1.0000 | ORAL_TABLET | Freq: Every day | ORAL | Status: DC

## 2015-12-07 MED ORDER — EMTRICITABINE-TENOFOVIR AF 200-25 MG PO TABS: 1.0000 | ORAL_TABLET | Freq: Every day | ORAL | Status: DC

## 2015-12-07 MED ORDER — TRIAMCINOLONE ACETONIDE 0.5 % EX OINT: 1.0000 "application " | TOPICAL_OINTMENT | Freq: Two times a day (BID) | CUTANEOUS | Status: DC

## 2015-12-07 MED ORDER — DOLUTEGRAVIR SODIUM 50 MG PO TABS: 50.0000 mg | ORAL_TABLET | Freq: Every day | ORAL | Status: DC

## 2015-12-07 MED ORDER — AZITHROMYCIN 600 MG PO TABS: 1200.0000 mg | ORAL_TABLET | ORAL | Status: DC

## 2015-12-07 MED ORDER — FLUCONAZOLE 100 MG PO TABS: 100.0000 mg | ORAL_TABLET | Freq: Every day | ORAL | Status: DC

## 2015-12-07 MED FILL — FLUCONAZOLE 100 MG TABLET: 100 | 14 days supply | Qty: 15 | Fill #0

## 2015-12-07 MED FILL — AZITHROMYCIN 600 MG TABLET: 600 | 28 days supply | Qty: 8 | Fill #0

## 2015-12-07 MED FILL — SULFAMETHOXAZOLE/TMP DS TAB: 800-160 | 30 days supply | Qty: 30 | Fill #0

## 2015-12-07 NOTE — Progress Notes (Signed)
Subjective:   Chief complaint: thrush, dysphagia, pruritis   Patient ID: Jeremy Harper, male    DOB: 1981-07-25, 35 y.o.   MRN: [id number]  HPI   35  Year old [place] American man who was pr  With previoulsy well conntrolled HIV, on Atripla. He had apparently lost insurance and he tells me for this reason had not been on his ARV since 2013 when last seen by Donovan Kail.   In the interim he had married and we actually have just seen his wife who developed acute HIV in the past month.   He tells me that his wife did not want to believe him that he had HIV, though this story is not consistent with that given by his wife. Regardless because his wife now came into care we were able to get Freedom back into care.   Unfortunately his HIV is completely out of control with VL in the 80 K range. His CD4 has plummeted to <10.  We discussed d various regimens and ultimately decided to start him on TIvicay and Descovy  Past Medical History  Diagnosis Date  . AIDS ([facility]) 12/07/2015  . Thrush 12/07/2015    History reviewed. No pertinent past surgical history.  History reviewed. No pertinent family history.    Social History   Social History  . Marital Status: Married    Spouse Name: N/A  . Number of Children: N/A  . Years of Education: N/A   Social History Main Topics  . Smoking status: Current Every Day Smoker -- 0.60 packs/day    Types: Cigarettes  . Smokeless tobacco: Never Used     Comment: cutting back  . Alcohol Use: 1.2 oz/week    2 Standard drinks or equivalent per week     Comment: 1 drink per week  . Drug Use: None  . Sexual Activity:    Partners: Female    Museum/gallery curator: Condom     Comment: epted condoms   Other Topics Concern  . None   Social History Narrative    No Known Allergies   Current outpatient prescriptions:  .  azithromycin (ZITHROMAX) 600 MG tablet, Take 2 tablets (1,200 mg total) by mouth every 7 (seven) days., Disp: 10 tablet,  Rfl: 5 .  dolutegravir (TIVICAY) 50 MG tablet, Take 1 tablet (50 mg total) by mouth daily., Disp: 30 tablet, Rfl: 11 .  emtricitabine-tenofovir AF (DESCOVY) 200-25 MG tablet, Take 1 tablet by mouth daily., Disp: 30 tablet, Rfl: 11 .  fluconazole (DIFLUCAN) 100 MG tablet, Take 1 tablet (100 mg total) by mouth daily., Disp: 15 tablet, Rfl: 2 .  sulfamethoxazole-trimethoprim (BACTRIM DS,SEPTRA DS) 800-160 MG tablet, Take 1 tablet by mouth daily., Disp: 30 tablet, Rfl: 5   Review of Systems  Constitutional: Negative for fever, chills, diaphoresis, activity change, appetite change, fatigue and unexpected weight change.  HENT: Positive for mouth sores, sore throat and trouble swallowing. Negative for congestion, rhinorrhea, sinus pressure and sneezing.   Eyes: Negative for photophobia and visual disturbance.  Respiratory: Positive for cough. Negative for chest tightness, shortness of breath, wheezing and stridor.   Cardiovascular: Negative for chest pain, palpitations and leg swelling.  Gastrointestinal: Negative for nausea, vomiting, abdominal pain, diarrhea, constipation, blood in stool, abdominal distention and anal bleeding.  Genitourinary: Negative for dysuria, hematuria, flank pain and difficulty urinating.  Musculoskeletal: Negative for myalgias, back pain, joint swelling, arthralgias and gait problem.  Skin: Positive for color change and rash. Negative for pallor and wound.  Neurological: Negative for dizziness, tremors, weakness and light-headedness.  Hematological: Negative for adenopathy. Does not bruise/bleed easily.  Psychiatric/Behavioral: Negative for behavioral problems, confusion, sleep disturbance, dysphoric mood, decreased concentration and agitation.       Objective:   Physical Exam  Constitutional: He is oriented to person, place, and time. He appears well-developed and well-nourished.  HENT:  Head: Normocephalic and atraumatic.  Mouth/Throat:    Eyes: Conjunctivae and  EOM are normal.  Neck: Normal range of motion. Neck supple.  Cardiovascular: Normal rate and regular rhythm.   Pulmonary/Chest: Effort normal. No respiratory distress. He has no wheezes.  Abdominal: Soft. He exhibits no distension.  Musculoskeletal: Normal range of motion. He exhibits no edema or tenderness.  Neurological: He is alert and oriented to person, place, and time.  Skin: Skin is warm and dry. Rash noted. No erythema. No pallor.  Psychiatric: He has a normal mood and affect. His behavior is normal. Judgment and thought content normal.          Assessment & Plan:   AIDS:  Start Tivicay, Descovy. He will need TMP/SMX and azithromycin for OI prophylaxiss genotpye is pending . HIs wife did not have transmitted R so I would assume he still has "wild type virus" We will get HLA b701 next time and considder TRIUMEQ  Thrush: give 2 week coures of fluconazole  Rash: try topical triamcinolone  I spent greater than 60 minutes with the patient including greater than 50% of time in face to face counsel of the patient re his HIV/AIDs, thrush rash and in coordination of his care.

## 2015-12-08 MED FILL — TRIAMCINOLONE 0.5% OINTMENT: 0.5 | 7 days supply | Qty: 30 | Fill #0

## 2015-12-09 LAB — RFLX HIV-1 INTEGRASE GENOTYPE

## 2015-12-11 MED FILL — TIVICAY 50 MG TABLET: 50 | 30 days supply | Qty: 30 | Fill #0

## 2015-12-11 MED FILL — DESCOVY 200-25 MG TABS: 200-25 | 30 days supply | Qty: 30 | Fill #0

## 2015-12-13 ENCOUNTER — Ambulatory Visit: Payer: 59 | Admitting: Internal Medicine

## 2015-12-15 LAB — HIV-1 GENOTYPE: HIV-1 Genotype: DETECTED — AB

## 2015-12-21 MED FILL — FLUCONAZOLE 100 MG TABLET: 100 | 14 days supply | Qty: 15 | Fill #1

## 2015-12-29 MED FILL — AZITHROMYCIN 600 MG TABLET: 600 | 28 days supply | Qty: 8 | Fill #1

## 2016-01-04 MED FILL — FLUCONAZOLE 100 MG TABLET: 100 | 14 days supply | Qty: 15 | Fill #2

## 2016-01-04 MED FILL — DESCOVY 200-25 MG TABS: 200-25 | 30 days supply | Qty: 30 | Fill #1

## 2016-01-04 MED FILL — TIVICAY 50 MG TABLET: 50 | 30 days supply | Qty: 30 | Fill #1

## 2016-01-04 MED FILL — SULFAMETHOXAZOLE/TMP DS TAB: 800-160 | 30 days supply | Qty: 30 | Fill #1

## 2016-01-10 ENCOUNTER — Ambulatory Visit: Payer: 59 | Admitting: Infectious Disease

## 2016-01-30 MED FILL — AZITHROMYCIN 600 MG TABLET: 600 | 28 days supply | Qty: 8 | Fill #2

## 2016-02-06 MED FILL — SULFAMETHOXAZOLE/TMP DS TAB: 800-160 | 30 days supply | Qty: 30 | Fill #2

## 2016-02-06 MED FILL — TIVICAY 50 MG TABLET: 50 | 30 days supply | Qty: 30 | Fill #2

## 2016-02-06 MED FILL — DESCOVY 200-25 MG TABS: 200-25 | 30 days supply | Qty: 30 | Fill #2

## 2016-03-11 MED FILL — SULFAMETHOXAZOLE/TMP DS TAB: 800-160 | 30 days supply | Qty: 30 | Fill #3

## 2016-03-11 MED FILL — DESCOVY 200-25 MG TABS: 200-25 | 30 days supply | Qty: 30 | Fill #3

## 2016-03-11 MED FILL — TIVICAY 50 MG TABLET: 50 | 30 days supply | Qty: 30 | Fill #3

## 2016-03-11 MED FILL — AZITHROMYCIN 600 MG TABLET: 600 | 28 days supply | Qty: 8 | Fill #3

## 2016-04-17 MED FILL — TIVICAY 50 MG TABLET: 50 | 30 days supply | Qty: 30 | Fill #4

## 2016-04-17 MED FILL — SULFAMETHOXAZOLE/TMP DS TAB: 800-160 | 30 days supply | Qty: 30 | Fill #4

## 2016-04-17 MED FILL — DESCOVY 200-25 MG TABS: 200-25 | 30 days supply | Qty: 30 | Fill #4

## 2016-04-17 MED FILL — AZITHROMYCIN 600 MG TABLET: 600 | 28 days supply | Qty: 8 | Fill #4

## 2016-05-20 MED FILL — DESCOVY 200-25 MG TABS: 200-25 | 30 days supply | Qty: 30 | Fill #5

## 2016-05-20 MED FILL — AZITHROMYCIN 600 MG TABLET: 600 | 28 days supply | Qty: 8 | Fill #5

## 2016-05-20 MED FILL — TIVICAY 50 MG TABLET: 50 | 30 days supply | Qty: 30 | Fill #5

## 2016-05-20 MED FILL — SULFAMETHOXAZOLE/TMP DS TAB: 800-160 | 30 days supply | Qty: 30 | Fill #5

## 2016-06-04 ENCOUNTER — Other Ambulatory Visit: Payer: 59

## 2016-06-04 DIAGNOSIS — B20 Human immunodeficiency virus [HIV] disease: Secondary | ICD-10-CM

## 2016-06-04 LAB — CBC WITH DIFFERENTIAL/PLATELET
Basophils Absolute: 26 cells/uL (ref 0–200)
Basophils Relative: 1 %
EOS PCT: 5 %
Eosinophils Absolute: 130 cells/uL (ref 15–500)
HCT: 37.6 % — ABNORMAL LOW (ref 38.5–50.0)
Hemoglobin: 12.7 g/dL — ABNORMAL LOW (ref 13.2–17.1)
LYMPHS PCT: 50 %
Lymphs Abs: 1300 cells/uL (ref 850–3900)
MCH: 31.9 pg (ref 27.0–33.0)
MCHC: 33.8 g/dL (ref 32.0–36.0)
MCV: 94.5 fL (ref 80.0–100.0)
MPV: 10 fL (ref 7.5–12.5)
Monocytes Absolute: 208 cells/uL (ref 200–950)
Monocytes Relative: 8 %
NEUTROS PCT: 36 %
Neutro Abs: 936 cells/uL — ABNORMAL LOW (ref 1500–7800)
Platelets: 227 10*3/uL (ref 140–400)
RBC: 3.98 MIL/uL — AB (ref 4.20–5.80)
RDW: 13.8 % (ref 11.0–15.0)
WBC: 2.6 10*3/uL — AB (ref 3.8–10.8)

## 2016-06-04 LAB — COMPLETE METABOLIC PANEL WITH GFR
ALBUMIN: 4 g/dL (ref 3.6–5.1)
ALK PHOS: 79 U/L (ref 40–115)
ALT: 15 U/L (ref 9–46)
AST: 19 U/L (ref 10–40)
BILIRUBIN TOTAL: 0.4 mg/dL (ref 0.2–1.2)
BUN: 14 mg/dL (ref 7–25)
CALCIUM: 9.1 mg/dL (ref 8.6–10.3)
CO2: 20 mmol/L (ref 20–31)
CREATININE: 1.39 mg/dL — AB (ref 0.60–1.35)
Chloride: 109 mmol/L (ref 98–110)
GFR, Est African American: 76 mL/min (ref >=60)
GFR, Est Non African American: 66 mL/min (ref >=60)
Glucose, Bld: 80 mg/dL (ref 65–99)
Potassium: 4 mmol/L (ref 3.5–5.3)
Sodium: 140 mmol/L (ref 135–146)
Total Protein: 6.7 g/dL (ref 6.1–8.1)

## 2016-06-05 LAB — HEPATITIS B SURFACE ANTIBODY,QUALITATIVE: Hep B S Ab: POSITIVE — AB

## 2016-06-05 LAB — HIV-1 RNA ULTRAQUANT REFLEX TO GENTYP+
HIV 1 RNA Quant: 25 {copies}/mL — ABNORMAL HIGH
HIV-1 RNA Quant, Log: 1.4 {Log_copies}/mL — ABNORMAL HIGH

## 2016-06-05 LAB — HEPATITIS C ANTIBODY: HCV Ab: NEGATIVE

## 2016-06-05 LAB — HEPATITIS B SURFACE ANTIGEN: Hepatitis B Surface Ag: NEGATIVE

## 2016-06-05 LAB — T-HELPER CELL (CD4) - (RCID CLINIC ONLY)
CD4 % Helper T Cell: 6 % — ABNORMAL LOW (ref 33–55)
CD4 T Cell Abs: 80 /uL — ABNORMAL LOW (ref 400–2700)

## 2016-06-13 LAB — HLA B*5701: HLA-B 5701 W/RFLX HLA-B HIGH: NEGATIVE

## 2016-06-25 ENCOUNTER — Other Ambulatory Visit: Payer: Self-pay | Admitting: Infectious Disease

## 2016-06-25 MED FILL — DESCOVY 200-25 MG TABS: 200-25 | 30 days supply | Qty: 30 | Fill #6

## 2016-06-25 MED FILL — AZITHROMYCIN 600 MG TABLET: 600 | 28 days supply | Qty: 8 | Fill #6

## 2016-06-25 MED FILL — SULFAMETHOXAZOLE/TMP DS TAB: 800-160 | 30 days supply | Qty: 30 | Fill #0

## 2016-06-25 MED FILL — TIVICAY 50 MG TABLET: 50 | 30 days supply | Qty: 30 | Fill #6

## 2016-07-03 ENCOUNTER — Ambulatory Visit (INDEPENDENT_AMBULATORY_CARE_PROVIDER_SITE_OTHER): Payer: 59 | Admitting: Infectious Disease

## 2016-07-03 ENCOUNTER — Encounter: Payer: Self-pay | Admitting: Infectious Disease

## 2016-07-03 DIAGNOSIS — B20 Human immunodeficiency virus [HIV] disease: Secondary | ICD-10-CM | POA: Diagnosis not present

## 2016-07-03 DIAGNOSIS — Z23 Encounter for immunization: Secondary | ICD-10-CM | POA: Diagnosis not present

## 2016-07-03 NOTE — Progress Notes (Signed)
Subjective:   Chief complaint: He is disappointed that his CD4 count is not risen sufficiently   Patient ID: Jeremy MeierMichael S Piper, male    DOB: [birth year], 35 y.o.   MRN: [id number]  HPI   35  Year old [place] man who was pr  With previoulsy well conntrolled HIV, on Atripla. He had apparently lost insurance and he tells me for this reason had not been on his ARV since 2013 when last seen by Traci SermonBrad Farrington.   In the interim he had married and we actually have just seen his wife who developed acute HIV in the past month.   He tells me that his wife did not want to believe him that he had HIV, though this story is not consistent with that given by his wife. Regardless because his wife now came into care we were able to get Casimiro NeedleMichael back into care.   Unfortunately his HIV is completely out of control with VL in the 80 K range. His CD4 has plummeted to <10.  We discussed d various regimens and ultimately decided to start him on TIvicay and Descovy. Since then he has achieved fairly nice virological suppression with most recent viral load of 27 CD4 count is gone up to 80.  Past Medical History:  Diagnosis Date  . AIDS ([facility]) 12/07/2015  . Rash and nonspecific skin eruption 12/07/2015  . Thrush 12/07/2015    No past surgical history on file.  No family history on file.    Social History   Social History  . Marital status: Married    Spouse name: N/A  . Number of children: N/A  . Years of education: N/A   Social History Main Topics  . Smoking status: Current Every Day Smoker    Packs/day: 0.60    Types: Cigarettes  . Smokeless tobacco: Never Used     Comment: cutting back  . Alcohol use 1.2 oz/week    2 Standard drinks or equivalent per week     Comment: 1 drink per week  . Drug use: Unknown    Frequency: 2.0 times per week    Types: Marijuana  . Sexual activity: Yes    Partners: Female    Birth control/ protection: Condom     Comment: epted condoms   Other Topics  Concern  . None   Social History Narrative  . None    No Known Allergies   Current Outpatient Prescriptions:  .  dolutegravir (TIVICAY) 50 MG tablet, Take 1 tablet (50 mg total) by mouth daily., Disp: 30 tablet, Rfl: 11 .  emtricitabine-tenofovir AF (DESCOVY) 200-25 MG tablet, Take 1 tablet by mouth daily., Disp: 30 tablet, Rfl: 11 .  sulfamethoxazole-trimethoprim (BACTRIM DS,SEPTRA DS) 800-160 MG tablet, TAKE 1 TABLET BY MOUTH ONCE DAILY, Disp: 30 tablet, Rfl: 5 .  triamcinolone ointment (KENALOG) 0.5 %, Apply 1 application topically 2 (two) times daily., Disp: 30 g, Rfl: 1 .  azithromycin (ZITHROMAX) 600 MG tablet, Take 2 tablets (1,200 mg total) by mouth every 7 (seven) days. (Patient not taking: Reported on 07/03/2016), Disp: 10 tablet, Rfl: 5 .  fluconazole (DIFLUCAN) 100 MG tablet, Take 1 tablet (100 mg total) by mouth daily. (Patient not taking: Reported on 07/03/2016), Disp: 15 tablet, Rfl: 2   Review of Systems  Constitutional: Negative for activity change, appetite change, chills, diaphoresis, fatigue, fever and unexpected weight change.  HENT: Negative for congestion, rhinorrhea, sinus pressure and sneezing.   Eyes: Negative for photophobia and visual disturbance.  Respiratory: Negative for chest tightness, shortness of breath, wheezing and stridor.   Cardiovascular: Negative for chest pain, palpitations and leg swelling.  Gastrointestinal: Negative for abdominal distention, abdominal pain, anal bleeding, blood in stool, constipation, diarrhea, nausea and vomiting.  Genitourinary: Negative for difficulty urinating, dysuria, flank pain and hematuria.  Musculoskeletal: Negative for arthralgias, back pain, gait problem, joint swelling and myalgias.  Skin: Negative for pallor and wound.  Neurological: Negative for dizziness, tremors, weakness and light-headedness.  Hematological: Negative for adenopathy. Does not bruise/bleed easily.  Psychiatric/Behavioral: Negative for  agitation, behavioral problems, confusion, decreased concentration, dysphoric mood and sleep disturbance.       Objective:   Physical Exam  Constitutional: He is oriented to person, place, and time. He appears well-developed and well-nourished.  HENT:  Head: Normocephalic and atraumatic.  Eyes: Conjunctivae and EOM are normal.  Neck: Normal range of motion. Neck supple.  Cardiovascular: Normal rate and regular rhythm.   Pulmonary/Chest: Effort normal. No respiratory distress. He has no wheezes.  Abdominal: Soft. He exhibits no distension.  Musculoskeletal: Normal range of motion. He exhibits no edema or tenderness.  Neurological: He is alert and oriented to person, place, and time.  Skin: Skin is warm and dry. No erythema. No pallor.  Psychiatric: He has a normal mood and affect. His behavior is normal. Judgment and thought content normal.          Assessment & Plan:   AIDS:  Continue Tivicay, Descovy. He will need TMP/SMX  for OI prophylaxis. I offered change to [facility] but he is happy on his regimen  I spent greater than 25 minutes with the patient including greater than 50% of time in face to face counsel of the patient re his HIV/AIDs,and in coordination of his care.

## 2016-07-22 ENCOUNTER — Other Ambulatory Visit: Payer: Self-pay | Admitting: *Deleted

## 2016-07-22 DIAGNOSIS — B20 Human immunodeficiency virus [HIV] disease: Secondary | ICD-10-CM

## 2016-07-22 MED ORDER — AZITHROMYCIN 600 MG PO TABS: 1200.0000 mg | ORAL_TABLET | ORAL | 5 refills | Status: DC

## 2016-08-08 MED FILL — TIVICAY 50 MG TABLET: 50 | 30 days supply | Qty: 30 | Fill #7

## 2016-08-08 MED FILL — SULFAMETHOXAZOLE/TMP DS TAB: 800-160 | 30 days supply | Qty: 30 | Fill #1

## 2016-08-08 MED FILL — DESCOVY 200-25 MG TABS: 200-25 | 30 days supply | Qty: 30 | Fill #7

## 2016-08-08 MED FILL — AZITHROMYCIN 600 MG TABLET: 600 | 35 days supply | Qty: 10 | Fill #0

## 2016-09-16 MED FILL — TIVICAY 50 MG TABLET: 50 | 30 days supply | Qty: 30 | Fill #8

## 2016-09-16 MED FILL — DESCOVY 200-25 MG TABS: 200-25 | 30 days supply | Qty: 30 | Fill #8 | Status: TO

## 2016-09-23 ENCOUNTER — Other Ambulatory Visit: Payer: 59

## 2016-09-26 ENCOUNTER — Other Ambulatory Visit: Payer: 59

## 2016-10-07 ENCOUNTER — Ambulatory Visit: Payer: 59 | Admitting: Infectious Disease

## 2016-10-15 MED FILL — TIVICAY 50 MG TABLET: 50 | 30 days supply | Qty: 30 | Fill #9

## 2016-10-16 MED FILL — DESCOVY 200-25 MG TABS: 200-25 | 30 days supply | Qty: 30 | Fill #0

## 2016-11-07 ENCOUNTER — Other Ambulatory Visit: Payer: 59

## 2016-11-07 DIAGNOSIS — B20 Human immunodeficiency virus [HIV] disease: Secondary | ICD-10-CM

## 2016-11-07 LAB — CBC WITH DIFFERENTIAL/PLATELET
Basophils Absolute: 49 cells/uL (ref 0–200)
Basophils Relative: 1 %
EOS PCT: 3 %
Eosinophils Absolute: 147 cells/uL (ref 15–500)
HCT: 40 % (ref 38.5–50.0)
HEMOGLOBIN: 13.6 g/dL (ref 13.2–17.1)
LYMPHS ABS: 1862 {cells}/uL (ref 850–3900)
Lymphocytes Relative: 38 %
MCH: 31.6 pg (ref 27.0–33.0)
MCHC: 34 g/dL (ref 32.0–36.0)
MCV: 93 fL (ref 80.0–100.0)
MPV: 10.4 fL (ref 7.5–12.5)
Monocytes Absolute: 343 cells/uL (ref 200–950)
Monocytes Relative: 7 %
NEUTROS ABS: 2499 {cells}/uL (ref 1500–7800)
Neutrophils Relative %: 51 %
Platelets: 212 10*3/uL (ref 140–400)
RBC: 4.3 MIL/uL (ref 4.20–5.80)
RDW: 14.4 % (ref 11.0–15.0)
WBC: 4.9 10*3/uL (ref 3.8–10.8)

## 2016-11-07 LAB — COMPLETE METABOLIC PANEL WITH GFR
ALBUMIN: 3.9 g/dL (ref 3.6–5.1)
ALK PHOS: 87 U/L (ref 40–115)
ALT: 15 U/L (ref 9–46)
AST: 19 U/L (ref 10–40)
BUN: 13 mg/dL (ref 7–25)
CO2: 25 mmol/L (ref 20–31)
Calcium: 9.1 mg/dL (ref 8.6–10.3)
Chloride: 106 mmol/L (ref 98–110)
Creat: 1.12 mg/dL (ref 0.60–1.35)
GFR, Est African American: >89 mL/min (ref >=60)
GFR, Est Non African American: 86 mL/min (ref >=60)
GLUCOSE: 88 mg/dL (ref 65–99)
POTASSIUM: 4.1 mmol/L (ref 3.5–5.3)
SODIUM: 139 mmol/L (ref 135–146)
TOTAL PROTEIN: 7.1 g/dL (ref 6.1–8.1)
Total Bilirubin: 0.4 mg/dL (ref 0.2–1.2)

## 2016-11-08 LAB — T-HELPER CELL (CD4) - (RCID CLINIC ONLY)
CD4 T CELL HELPER: 9 % — AB (ref 33–55)
CD4 T Cell Abs: 190 /uL — ABNORMAL LOW (ref 400–2700)

## 2016-11-08 LAB — RPR

## 2016-11-08 LAB — GLUCOSE 6 PHOSPHATE DEHYDROGENASE: G-6PDH: 14.2 U/g Hgb (ref 7.0–20.5)

## 2016-11-10 LAB — HIV RNA, RTPCR W/R GT (RTI, PI,INT): HIV-1 RNA, QN PCR: <20 copies/mL

## 2016-11-13 MED FILL — TIVICAY 50 MG TABLET: 50 | 30 days supply | Qty: 30 | Fill #10

## 2016-11-13 MED FILL — DESCOVY 200-25 MG TABS: 200-25 | 30 days supply | Qty: 30 | Fill #1

## 2016-11-21 ENCOUNTER — Ambulatory Visit: Payer: 59 | Admitting: Infectious Disease

## 2016-12-11 ENCOUNTER — Other Ambulatory Visit: Payer: Self-pay | Admitting: Pharmacist

## 2016-12-11 DIAGNOSIS — B20 Human immunodeficiency virus [HIV] disease: Secondary | ICD-10-CM

## 2016-12-11 MED ORDER — EMTRICITABINE-TENOFOVIR AF 200-25 MG PO TABS: 1.0000 | ORAL_TABLET | Freq: Every day | ORAL | 11 refills | Status: DC

## 2016-12-11 MED ORDER — DOLUTEGRAVIR SODIUM 50 MG PO TABS: 50.0000 mg | ORAL_TABLET | Freq: Every day | ORAL | 11 refills | Status: DC

## 2016-12-11 MED FILL — TIVICAY 50 MG TABLET: 50 | 30 days supply | Qty: 30 | Fill #0

## 2016-12-11 MED FILL — DESCOVY 200-25 MG TABS: 200-25 | 30 days supply | Qty: 30 | Fill #0

## 2016-12-15 ENCOUNTER — Emergency Department (HOSPITAL_COMMUNITY): Payer: 59

## 2016-12-15 ENCOUNTER — Emergency Department (HOSPITAL_COMMUNITY)
Admission: EM | Admit: 2016-12-15 | Discharge: 2016-12-15 | Disposition: A | Discharge status: Home / Self Care | Payer: 59 | Attending: Emergency Medicine | Admitting: Emergency Medicine

## 2016-12-15 ENCOUNTER — Encounter (HOSPITAL_COMMUNITY): Payer: Self-pay | Admitting: *Deleted

## 2016-12-15 DIAGNOSIS — W07XXXA Fall from chair, initial encounter: Secondary | ICD-10-CM | POA: Insufficient documentation

## 2016-12-15 DIAGNOSIS — Y929 Unspecified place or not applicable: Secondary | ICD-10-CM | POA: Diagnosis not present

## 2016-12-15 DIAGNOSIS — Y939 Activity, unspecified: Secondary | ICD-10-CM | POA: Diagnosis not present

## 2016-12-15 DIAGNOSIS — R51 Headache: Secondary | ICD-10-CM | POA: Insufficient documentation

## 2016-12-15 DIAGNOSIS — M542 Cervicalgia: Secondary | ICD-10-CM | POA: Diagnosis not present

## 2016-12-15 DIAGNOSIS — Y999 Unspecified external cause status: Secondary | ICD-10-CM | POA: Insufficient documentation

## 2016-12-15 DIAGNOSIS — M62838 Other muscle spasm: Secondary | ICD-10-CM | POA: Diagnosis not present

## 2016-12-15 DIAGNOSIS — F17210 Nicotine dependence, cigarettes, uncomplicated: Secondary | ICD-10-CM | POA: Insufficient documentation

## 2016-12-15 DIAGNOSIS — R05 Cough: Secondary | ICD-10-CM | POA: Diagnosis not present

## 2016-12-15 LAB — COMPREHENSIVE METABOLIC PANEL
ALT: 20 U/L (ref 17–63)
AST: 22 U/L (ref 15–41)
Albumin: 3.4 g/dL — ABNORMAL LOW (ref 3.5–5.0)
Alkaline Phosphatase: 93 U/L (ref 38–126)
Anion gap: 6 (ref 5–15)
BUN: 9 mg/dL (ref 6–20)
CALCIUM: 9.1 mg/dL (ref 8.9–10.3)
CO2: 26 mmol/L (ref 22–32)
Chloride: 108 mmol/L (ref 101–111)
Creatinine, Ser: 1.07 mg/dL (ref 0.61–1.24)
GFR calc Af Amer: >60 mL/min (ref >60)
Glucose, Bld: 93 mg/dL (ref 65–99)
POTASSIUM: 4.1 mmol/L (ref 3.5–5.1)
Sodium: 140 mmol/L (ref 135–145)
TOTAL PROTEIN: 7.4 g/dL (ref 6.5–8.1)

## 2016-12-15 LAB — CBC WITH DIFFERENTIAL/PLATELET
BASOS ABS: 0 10*3/uL (ref 0.0–0.1)
Basophils Relative: 0 %
Eosinophils Absolute: 0.1 10*3/uL (ref 0.0–0.7)
Eosinophils Relative: 2 %
HEMATOCRIT: 38.9 % — AB (ref 39.0–52.0)
Hemoglobin: 13.2 g/dL (ref 13.0–17.0)
LYMPHS PCT: 24 %
Lymphs Abs: 1.4 10*3/uL (ref 0.7–4.0)
MCH: 31.7 pg (ref 26.0–34.0)
MCHC: 33.9 g/dL (ref 30.0–36.0)
MCV: 93.3 fL (ref 78.0–100.0)
MONO ABS: 0.3 10*3/uL (ref 0.1–1.0)
Monocytes Relative: 6 %
NEUTROS ABS: 3.8 10*3/uL (ref 1.7–7.7)
Neutrophils Relative %: 68 %
Platelets: 193 10*3/uL (ref 150–400)
RBC: 4.17 MIL/uL — ABNORMAL LOW (ref 4.22–5.81)
RDW: 13.2 % (ref 11.5–15.5)
WBC: 5.6 10*3/uL (ref 4.0–10.5)

## 2016-12-15 LAB — ETHANOL: Alcohol, Ethyl (B): <5 mg/dL (ref <5)

## 2016-12-15 LAB — I-STAT CG4 LACTIC ACID, ED: LACTIC ACID, VENOUS: 0.88 mmol/L (ref 0.5–1.9)

## 2016-12-15 MED ORDER — MELOXICAM 7.5 MG PO TABS: 7.5000 mg | ORAL_TABLET | Freq: Every day | ORAL | 0 refills | Status: DC

## 2016-12-15 NOTE — ED Provider Notes (Signed)
MSE was initiated and I personally evaluated the patient and placed orders (if any) at  9:31 AM on December 15, 2016.  The patient appears stable so that the remainder of the MSE may be completed by another provider.   36 year old male with a history of AIDS (CD4 count 190 on 11/07/16) who presents to the emergency department with worsening left-sided neck pain for 1 day. He reports he was completing homework yesterday afternoon and fell asleep in a chair. When he awoke, he noticed his neck was starting to hurt, but the pain progressed throughout the evening. He reports a history of previous neck pain, but reports the current pain is substantially worse than previous episodes. He reports he thinks that he slept on his neck wrong. He also reports associated muscle spasm and tingling down the left arm to the elbow. No recent trauma or injury. He denies headache, fever, chills, chest pain, or abdominal pain. He reports he took less than one shot of alcohol this morning to try and improve the pain. No other treatment prior to arrival. He reports his wife was sick last week with pneumonia-like symptoms.  He reports a nonproductive cough that began one week ago, but has become productive with clear yellow sputum in the last 2 days. No sore throat, congestion, otalgia, dyspnea.  PMH include AIDS. He is followed by Dr. Daiva Eves He is compliant with HAART therapy. He denies daily alcohol use.   Physical exam: He appears uncomfortable. Decreased range of motion with passive and active flexion of the neck. Decreased range of motion with lateral flexion of the neck. His left trapezius and sternocleidomastoid muscles are tense. No focal neurological findings. Negative Kernig and Brudzinski. Rhinorrhea noted in the bilateral nature. No postpharyngeal erythema. EOM intact bilaterally. No nystagmus.Left lung is clear to auscultation. Coarse breath sounds noted on the right, but no wheezes rhonchi or rales.    Will transfer to  negative pressure room and sign out care to Dr. Margarita Grizzle at this time for continued workup   Barkley Boards, PA-C 12/15/16 [id number]    Margarita Grizzle, MD 12/16/16 1724

## 2016-12-15 NOTE — ED Provider Notes (Signed)
[facility]-EMERGENCY DEPT Provider Note   CSN: [id number] Arrival date & time: 12/15/16  0815     History   Chief Complaint Chief Complaint  Patient presents with  . Neck Pain    HPI Jeremy Harper is a 36 y.o. male.  HPI  60 -year-old male history of HIV /aids Who had been off of his anti-retroviral therapy and had increased viral load to 80 80,000 and CD4 that had decreased to less than 10 but was restarted on antiretrovirals therapy in the fall. Last note from October states viral load down to 27 and CD4 count up to 80. He presents today with neck pain. He states he did fall asleep in a chair yesterday. However, he has had some cough productive of yellow sputum for the past 2 days. He denies any fever, chills, chin,, or dyspnea.  Past Medical History:  Diagnosis Date  . AIDS (HCC) 12/07/2015  . Rash and nonspecific skin eruption 12/07/2015  . Thrush 12/07/2015    Patient Active Problem List   Diagnosis Date Noted  . AIDS (HCC) 12/07/2015  . Rash and nonspecific skin eruption 12/07/2015  . HIV INFECTION 03/06/2010    History reviewed. No pertinent surgical history.     Home Medications    Prior to Admission medications   Medication Sig Start Date End Date Taking? Authorizing Provider  azithromycin (ZITHROMAX) 600 MG tablet Take 2 tablets (1,200 mg total) by mouth every 7 (seven) days. 07/22/16   Randall Hiss, MD  dolutegravir (TIVICAY) 50 MG tablet Take 1 tablet (50 mg total) by mouth daily. 12/11/16   Cassie L Kuppelweiser, RPH  emtricitabine-tenofovir AF (DESCOVY) 200-25 MG tablet Take 1 tablet by mouth daily. 12/11/16   Cassie L Kuppelweiser, RPH  fluconazole (DIFLUCAN) 100 MG tablet Take 1 tablet (100 mg total) by mouth daily. Patient not taking: Reported on 07/03/2016 12/07/15   Randall Hiss, MD  sulfamethoxazole-trimethoprim (BACTRIM DS,SEPTRA DS) 800-160 MG tablet TAKE 1 TABLET BY MOUTH ONCE DAILY 06/25/16   Randall Hiss, MD  triamcinolone ointment  (KENALOG) 0.5 % Apply 1 application topically 2 (two) times daily. 12/07/15   Randall Hiss, MD    Family History History reviewed. No pertinent family history.  Social History Social History  Substance Use Topics  . Smoking status: Current Every Day Smoker    Packs/day: 0.60    Types: Cigarettes  . Smokeless tobacco: Never Used     Comment: cutting back  . Alcohol use 1.2 oz/week    2 Standard drinks or equivalent per week     Comment: this AM 12-15-16     Allergies   Patient has no known allergies.   Review of Systems Review of Systems  All other systems reviewed and are negative.    Physical Exam Updated Vital Signs BP 122/80   Pulse 62   Temp 98.6 F (37 C) (Oral)   Resp 17   Ht  (1.753 m)   Wt 79.4 kg   SpO2 99%   BMI 25.84 kg/m   Physical Exam  Constitutional: He is oriented to person, place, and time. He appears well-developed and well-nourished.  HENT:  Head: Normocephalic and atraumatic.  Eyes: Conjunctivae and EOM are normal. Pupils are equal, round, and reactive to light.  Neck: Normal range of motion. Neck supple.  Tenderness base of left skull. No masses noted. Neck is supple  Cardiovascular: Normal rate and regular rhythm.   Pulmonary/Chest: Effort normal.  Abdominal:  Soft.  Musculoskeletal: Normal range of motion.  Neurological: He is alert and oriented to person, place, and time. No cranial nerve deficit. He exhibits normal muscle tone.  Skin: Skin is warm. Capillary refill takes less than 2 seconds.  Nursing note and vitals reviewed.    ED Treatments / Results  Labs (all labs ordered are listed, but only abnormal results are displayed) Labs Reviewed  CBC WITH DIFFERENTIAL/PLATELET - Abnormal; Notable for the following:       Result Value   RBC 4.17 (*)    HCT 38.9 (*)    All other components within normal limits  COMPREHENSIVE METABOLIC PANEL - Abnormal; Notable for the following:    Albumin 3.4 (*)    Total Bilirubin  <0.1 (*)    All other components within normal limits  ETHANOL  CD4/CD8 (T-HELPER/T-SUPPRESSOR CELL)  I-STAT CG4 LACTIC ACID, ED    EKG  EKG Interpretation None       Radiology Dg Chest 2 View  Result Date: 12/15/2016 CLINICAL DATA:  Pt c/o left side neck pain for 1 day and intermittent cough for 1 week. Pt reports the cough has become productive over the past 2 days. Pt denies fever. Hx AIDS, current smoker. EXAM: CHEST  2 VIEW COMPARISON:  Chest x-Findley Blankenbaker dated 09/11/2007. FINDINGS: The heart size and mediastinal contours are within normal limits. Both lungs are clear. The visualized skeletal structures are unremarkable. IMPRESSION: No active cardiopulmonary disease. No evidence of pneumonia or pulmonary edema. Electronically Signed   By: Bary Richard M.D.   On: 12/15/2016 10:44   Ct Head Wo Contrast  Result Date: 12/15/2016 CLINICAL DATA:  Left-sided neck pain, limited range of motion and muscle spasms. EXAM: CT HEAD WITHOUT CONTRAST CT CERVICAL SPINE WITHOUT CONTRAST TECHNIQUE: Multidetector CT imaging of the head and cervical spine was performed following the standard protocol without intravenous contrast. Multiplanar CT image reconstructions of the cervical spine were also generated. COMPARISON:  None. FINDINGS: CT HEAD FINDINGS Brain: Ventricles are normal in size and configuration. There is no mass, hemorrhage, edema or other evidence of acute parenchymal abnormality. No extra-axial hemorrhage. Vascular: No hyperdense vessel or unexpected calcification. Skull: Normal. Negative for fracture or focal lesion. Sinuses/Orbits: No acute finding. Other: None. CT CERVICAL SPINE FINDINGS Alignment: Mild reversal of the normal cervical spine lordosis. No evidence of acute vertebral body subluxation. Skull base and vertebrae: No fracture line or displaced fracture fragment identified. No acute appearing cortical irregularity or osseous lesion. Soft tissues and spinal canal: No prevertebral fluid or  swelling. No visible canal hematoma. Disc levels:  Disc spaces are well preserved throughout. Upper chest: Emphysematous changes at the lung apices. Other: None. IMPRESSION: 1. Normal head CT.  No intracranial mass, hemorrhage or edema. 2. Mild reversal of the normal cervical spine lordosis, likely related to patient positioning or muscle spasm. No fracture or acute subluxation within the cervical spine. 3. Prominent emphysematous changes at the lung apices. Electronically Signed   By: Bary Richard M.D.   On: 12/15/2016 11:24   Ct Cervical Spine Wo Contrast  Result Date: 12/15/2016 CLINICAL DATA:  Left-sided neck pain, limited range of motion and muscle spasms. EXAM: CT HEAD WITHOUT CONTRAST CT CERVICAL SPINE WITHOUT CONTRAST TECHNIQUE: Multidetector CT imaging of the head and cervical spine was performed following the standard protocol without intravenous contrast. Multiplanar CT image reconstructions of the cervical spine were also generated. COMPARISON:  None. FINDINGS: CT HEAD FINDINGS Brain: Ventricles are normal in size and configuration. There is no  mass, hemorrhage, edema or other evidence of acute parenchymal abnormality. No extra-axial hemorrhage. Vascular: No hyperdense vessel or unexpected calcification. Skull: Normal. Negative for fracture or focal lesion. Sinuses/Orbits: No acute finding. Other: None. CT CERVICAL SPINE FINDINGS Alignment: Mild reversal of the normal cervical spine lordosis. No evidence of acute vertebral body subluxation. Skull base and vertebrae: No fracture line or displaced fracture fragment identified. No acute appearing cortical irregularity or osseous lesion. Soft tissues and spinal canal: No prevertebral fluid or swelling. No visible canal hematoma. Disc levels:  Disc spaces are well preserved throughout. Upper chest: Emphysematous changes at the lung apices. Other: None. IMPRESSION: 1. Normal head CT.  No intracranial mass, hemorrhage or edema. 2. Mild reversal of the  normal cervical spine lordosis, likely related to patient positioning or muscle spasm. No fracture or acute subluxation within the cervical spine. 3. Prominent emphysematous changes at the lung apices. Electronically Signed   By: Bary Richard M.D.   On: 12/15/2016 11:24    Procedures Procedures (including critical care time)  Medications Ordered in ED Medications - No data to display   Initial Impression / Assessment and Plan / ED Course  I have reviewed the triage vital signs and the nursing notes.  Pertinent labs & imaging results that were available during my care of the patient were reviewed by me and considered in my medical decision making (see chart for details).     This is a 36 year old man with HIV/AIDS presents today with some neck pain most consistent with musculoskeletal. White blood cell count is now 5600 with 68 neutrophils. CD4 ordered but will not result today. Patient does not appear to have infection here. I have discussed return precautions and need for close follow-up and he voices understanding.  Final Clinical Impressions(s) / ED Diagnoses   Final diagnoses:  Neck pain    New Prescriptions New Prescriptions   No medications on file     Margarita Grizzle, MD 12/15/16 1202

## 2016-12-15 NOTE — ED Triage Notes (Signed)
Pt reports Lt side of neck painful with muscle spasms . Pt fell asleep in a chair last night. Pt reports ETOH intake this AM.

## 2016-12-16 LAB — CD4/CD8 (T-HELPER/T-SUPPRESSOR CELL)
CD4 absolute: 140 /uL — ABNORMAL LOW (ref 500–1900)
CD4%: 9 % — ABNORMAL LOW (ref 30.0–60.0)
CD8 T CELL ABS: 900 /uL (ref 230–1000)
CD8TOX: 59 % — AB (ref 15.0–40.0)
RATIO: 0.16 — AB (ref 1.0–3.0)
Total lymphocyte count: 1541 /uL (ref 1000–4000)

## 2016-12-30 DIAGNOSIS — H524 Presbyopia: Secondary | ICD-10-CM | POA: Diagnosis not present

## 2017-01-02 ENCOUNTER — Encounter: Payer: Self-pay | Admitting: Infectious Disease

## 2017-01-02 ENCOUNTER — Ambulatory Visit (INDEPENDENT_AMBULATORY_CARE_PROVIDER_SITE_OTHER): Payer: 59 | Admitting: Infectious Disease

## 2017-01-02 VITALS — BP 116/61 | HR 91 | Temp 99.0°F | Wt 181.0 lb

## 2017-01-02 DIAGNOSIS — Z23 Encounter for immunization: Secondary | ICD-10-CM | POA: Diagnosis not present

## 2017-01-02 DIAGNOSIS — J430 Unilateral pulmonary emphysema [MacLeod's syndrome]: Secondary | ICD-10-CM

## 2017-01-02 DIAGNOSIS — J439 Emphysema, unspecified: Secondary | ICD-10-CM

## 2017-01-02 DIAGNOSIS — B20 Human immunodeficiency virus [HIV] disease: Secondary | ICD-10-CM | POA: Diagnosis not present

## 2017-01-02 DIAGNOSIS — F17200 Nicotine dependence, unspecified, uncomplicated: Secondary | ICD-10-CM | POA: Diagnosis not present

## 2017-01-02 HISTORY — DX: Nicotine dependence, unspecified, uncomplicated: F17.200

## 2017-01-02 HISTORY — DX: Emphysema, unspecified: J43.9

## 2017-01-02 MED ORDER — EMTRICITABINE-TENOFOVIR AF 200-25 MG PO TABS: 1.0000 | ORAL_TABLET | Freq: Every day | ORAL | 11 refills | Status: DC

## 2017-01-02 MED ORDER — DOLUTEGRAVIR SODIUM 50 MG PO TABS: 50.0000 mg | ORAL_TABLET | Freq: Every day | ORAL | 11 refills | Status: DC

## 2017-01-02 MED ORDER — SULFAMETHOXAZOLE-TRIMETHOPRIM 800-160 MG PO TABS: 1.0000 | ORAL_TABLET | ORAL | 4 refills | Status: DC

## 2017-01-02 MED FILL — DESCOVY 200-25 MG TABS: 200-25 | 30 days supply | Qty: 30 | Fill #0 | Status: TO

## 2017-01-02 MED FILL — SULFAMETHOXAZOLE/TMP DS TAB: 800-160 | 50 days supply | Qty: 20 | Fill #0

## 2017-01-02 MED FILL — TIVICAY 50 MG TABLET: 50 | 30 days supply | Qty: 30 | Fill #0 | Status: TO

## 2017-01-02 NOTE — Progress Notes (Signed)
Subjective:   Chief complaint: still with concerns about how to make his CD4 rise further   Patient ID: Jeremy Harper, male    DOB: [birth year], 36 y.o.   MRN: [id number]  HPI   36  Year old [place] American man who was pr  With previoulsy well conntrolled HIV, on Atripla. He had apparently lost insurance and he tells me for this reason had not been on his ARV since 2013 when last seen by Traci Sermon.   In the interim he had married and we actually have just seen his wife who developed acute HIV in the past month.     Unfortunately when he came back into care his HIV was completely out of control with VL in the 80 K range and his CD4 has plummeted to <10.  We discussed d various regimens and ultimately decided to start him on TIvicay and Descovy. Since then he has achieved fairly nice virological suppression with most recent viral load of 27 CD4 count is gone up to 190. He was in ED recently with neck pain and CD4 checked again and was 140 though this was in context of URI.  Lab Results  Component Value Date   HIV1RNAQUANT 25 (H) 06/04/2016   HIV1RNAQUANT 86,200 (H) 11/29/2015   HIV1RNAQUANT <20 04/25/2011   Lab Results  Component Value Date   CD4TABS 190 (L) 11/07/2016   CD4TABS 80 (L) 06/04/2016   CD4TABS <10 (L) 11/29/2015     Past Medical History:  Diagnosis Date  . AIDS (HCC) 12/07/2015  . Emphysema of lung (HCC) 01/02/2017  . Rash and nonspecific skin eruption 12/07/2015  . Smoker 01/02/2017  . Thrush 12/07/2015    No past surgical history on file.  No family history on file.    Social History   Social History  . Marital status: Married    Spouse name: N/A  . Number of children: N/A  . Years of education: N/A   Social History Main Topics  . Smoking status: Current Every Day Smoker    Packs/day: 0.60    Types: Cigarettes  . Smokeless tobacco: Never Used     Comment: black/milds   . Alcohol use 1.2 oz/week    2 Standard drinks or equivalent per  week     Comment: this AM 12-15-16  . Drug use: Unknown    Frequency: 2.0 times per week    Types: Marijuana  . Sexual activity: Yes    Partners: Female    Birth control/ protection: Condom     Comment: epted condoms   Other Topics Concern  . None   Social History Narrative  . None    No Known Allergies   Current Outpatient Prescriptions:  .  dolutegravir (TIVICAY) 50 MG tablet, Take 1 tablet (50 mg total) by mouth daily., Disp: 30 tablet, Rfl: 11 .  emtricitabine-tenofovir AF (DESCOVY) 200-25 MG tablet, Take 1 tablet by mouth daily., Disp: 30 tablet, Rfl: 11 .  meloxicam (MOBIC) 7.5 MG tablet, Take 1 tablet (7.5 mg total) by mouth daily. (Patient not taking: Reported on 01/02/2017), Disp: 10 tablet, Rfl: 0 .  [START ON 01/03/2017] sulfamethoxazole-trimethoprim (BACTRIM DS,SEPTRA DS) 800-160 MG tablet, Take 1 tablet by mouth 3 (three) times a week., Disp: 20 tablet, Rfl: 4 .  triamcinolone ointment (KENALOG) 0.5 %, Apply 1 application topically 2 (two) times daily. (Patient not taking: Reported on 01/02/2017), Disp: 30 g, Rfl: 1   Review of Systems  Constitutional: Negative for activity change, appetite  change, chills, diaphoresis, fatigue, fever and unexpected weight change.  HENT: Negative for congestion, rhinorrhea, sinus pressure and sneezing.   Eyes: Negative for photophobia and visual disturbance.  Respiratory: Negative for chest tightness, shortness of breath, wheezing and stridor.   Cardiovascular: Negative for chest pain, palpitations and leg swelling.  Gastrointestinal: Negative for abdominal distention, abdominal pain, anal bleeding, blood in stool, constipation, diarrhea, nausea and vomiting.  Genitourinary: Negative for difficulty urinating, dysuria, flank pain and hematuria.  Musculoskeletal: Negative for arthralgias, back pain, gait problem, joint swelling and myalgias.  Skin: Negative for pallor and wound.  Neurological: Negative for dizziness, tremors, weakness and  light-headedness.  Hematological: Negative for adenopathy. Does not bruise/bleed easily.  Psychiatric/Behavioral: Negative for agitation, behavioral problems, confusion, decreased concentration, dysphoric mood and sleep disturbance.       Objective:   Physical Exam  Constitutional: He is oriented to person, place, and time. He appears well-developed and well-nourished.  HENT:  Head: Normocephalic and atraumatic.  Eyes: Conjunctivae and EOM are normal.  Neck: Normal range of motion. Neck supple.  Cardiovascular: Normal rate and regular rhythm.   Pulmonary/Chest: Effort normal. No respiratory distress. He has no wheezes.  Abdominal: Soft. He exhibits no distension.  Musculoskeletal: Normal range of motion. He exhibits no edema or tenderness.  Neurological: He is alert and oriented to person, place, and time.  Skin: Skin is warm and dry. No pallor.  Psychiatric: He has a normal mood and affect. His behavior is normal. Judgment and thought content normal.          Assessment & Plan:   AIDS:  Continue Tivicay, Descovy. He will need TMP/SMX  for OI prophylaxis. But change to TIW. Recheck labs in 3 months time. Meds sent to [facility] pharmacy  Smoker: counselled that he needs to quit esp with emphysematous changes seen on CT  I spent greater than 25 minutes with the patient including greater than 50% of time in face to face counsel of the patient re his HIV/AIDs, smoking and in coordination of his care.

## 2017-02-06 MED FILL — DESCOVY 200-25 MG TABS: 200-25 | 30 days supply | Qty: 30 | Fill #0

## 2017-02-06 MED FILL — TIVICAY 50 MG TABLET: 50 | 30 days supply | Qty: 30 | Fill #0

## 2017-03-05 MED FILL — TIVICAY 50 MG TABLET: 50 | 30 days supply | Qty: 30 | Fill #1

## 2017-03-05 MED FILL — DESCOVY 200-25 MG TABS: 200-25 | 30 days supply | Qty: 30 | Fill #1

## 2017-03-28 MED FILL — DESCOVY 200-25 MG TABS: 200-25 | 30 days supply | Qty: 30 | Fill #2

## 2017-03-28 MED FILL — TIVICAY 50 MG TABLET: 50 | 30 days supply | Qty: 30 | Fill #2

## 2017-04-16 ENCOUNTER — Other Ambulatory Visit: Payer: 59

## 2017-04-21 ENCOUNTER — Other Ambulatory Visit: Payer: 59

## 2017-04-21 DIAGNOSIS — B20 Human immunodeficiency virus [HIV] disease: Secondary | ICD-10-CM | POA: Diagnosis not present

## 2017-04-21 LAB — COMPLETE METABOLIC PANEL WITH GFR
ALK PHOS: 94 U/L (ref 40–115)
ALT: 14 U/L (ref 9–46)
AST: 15 U/L (ref 10–40)
Albumin: 4.1 g/dL (ref 3.6–5.1)
BUN: 11 mg/dL (ref 7–25)
CHLORIDE: 106 mmol/L (ref 98–110)
CO2: 22 mmol/L (ref 20–32)
Calcium: 9 mg/dL (ref 8.6–10.3)
Creat: 1.31 mg/dL (ref 0.60–1.35)
GFR, EST NON AFRICAN AMERICAN: 71 mL/min (ref >=60)
GFR, Est African American: 82 mL/min (ref >=60)
GLUCOSE: 109 mg/dL — AB (ref 65–99)
POTASSIUM: 3.9 mmol/L (ref 3.5–5.3)
SODIUM: 139 mmol/L (ref 135–146)
Total Bilirubin: 0.5 mg/dL (ref 0.2–1.2)
Total Protein: 7.1 g/dL (ref 6.1–8.1)

## 2017-04-21 LAB — CBC WITH DIFFERENTIAL/PLATELET
BASOS ABS: 37 {cells}/uL (ref 0–200)
Basophils Relative: 1 %
EOS ABS: 185 {cells}/uL (ref 15–500)
EOS PCT: 5 %
HEMATOCRIT: 40.5 % (ref 38.5–50.0)
HEMOGLOBIN: 13.9 g/dL (ref 13.2–17.1)
LYMPHS ABS: 1887 {cells}/uL (ref 850–3900)
Lymphocytes Relative: 51 %
MCH: 31.9 pg (ref 27.0–33.0)
MCHC: 34.3 g/dL (ref 32.0–36.0)
MCV: 92.9 fL (ref 80.0–100.0)
MONO ABS: 407 {cells}/uL (ref 200–950)
MPV: 10.3 fL (ref 7.5–12.5)
Monocytes Relative: 11 %
NEUTROS ABS: 1184 {cells}/uL — AB (ref 1500–7800)
Neutrophils Relative %: 32 %
Platelets: 201 10*3/uL (ref 140–400)
RBC: 4.36 MIL/uL (ref 4.20–5.80)
RDW: 13.8 % (ref 11.0–15.0)
WBC: 3.7 10*3/uL — ABNORMAL LOW (ref 3.8–10.8)

## 2017-04-22 LAB — T-HELPER CELL (CD4) - (RCID CLINIC ONLY)
CD4 T CELL ABS: 230 /uL — AB (ref 400–2700)
CD4 T CELL HELPER: 11 % — AB (ref 33–55)

## 2017-04-22 LAB — RPR

## 2017-04-24 LAB — HIV-1 RNA QUANT-NO REFLEX-BLD
HIV 1 RNA QUANT: 23 {copies}/mL — AB
HIV-1 RNA QUANT, LOG: 1.36 {Log_copies}/mL — AB

## 2017-04-30 ENCOUNTER — Ambulatory Visit: Payer: 59 | Admitting: Infectious Disease

## 2017-05-05 MED FILL — TIVICAY 50 MG TABLET: 50 | 30 days supply | Qty: 30 | Fill #3

## 2017-05-05 MED FILL — DESCOVY 200-25 MG TABS: 200-25 | 30 days supply | Qty: 30 | Fill #3

## 2017-05-26 ENCOUNTER — Ambulatory Visit (INDEPENDENT_AMBULATORY_CARE_PROVIDER_SITE_OTHER): Payer: 59 | Admitting: Infectious Disease

## 2017-05-26 ENCOUNTER — Encounter: Payer: Self-pay | Admitting: Infectious Disease

## 2017-05-26 VITALS — BP 115/74 | HR 62 | Temp 98.1°F | Ht 69.0 in | Wt 177.0 lb

## 2017-05-26 DIAGNOSIS — B20 Human immunodeficiency virus [HIV] disease: Secondary | ICD-10-CM | POA: Diagnosis not present

## 2017-05-26 DIAGNOSIS — Z113 Encounter for screening for infections with a predominantly sexual mode of transmission: Secondary | ICD-10-CM

## 2017-05-26 DIAGNOSIS — Z79899 Other long term (current) drug therapy: Secondary | ICD-10-CM

## 2017-05-26 DIAGNOSIS — F17200 Nicotine dependence, unspecified, uncomplicated: Secondary | ICD-10-CM | POA: Diagnosis not present

## 2017-05-26 DIAGNOSIS — Z23 Encounter for immunization: Secondary | ICD-10-CM

## 2017-05-26 NOTE — Progress Notes (Signed)
Subjective:   Chief complaint: followup for HIV on medications   Patient ID: Jeremy Harper, male    DOB: [birth year], 36 y.o.   MRN: [id number]  HPI   36  Year old [place] man who was pr  With previoulsy well conntrolled HIV, on Atripla. He had apparently lost insurance and he tells me for this reason had not been on his ARV since 2013 when last seen by Traci Sermon.   In the interim he had married and we actually have just seen his wife who developed acute HIV in the past month.   Unfortunately when he came back into care his HIV was completely out of control with VL in the 80 K range and his CD4 has plummeted to <10.  We discussed d various regimens and ultimately decided to start him on TIvicay and Descovy. Since then he has achieved fairly nice virological suppression with most recent viral load of 27 CD4 count is gone up to 190 and now above 200.  We discussed changing to STR but he is happy with TIvicay and Descovy. He and his wife [name] who came with him are considering having a baby again and I will revisit her regimen with her at her future visit.  I DID review the data on DTG and 0.7% neural tube defects vs 0.1%. [name] is not on this and it is not clear that if this is in fact an effect of DTG that it is an effect of all INSTI including the older ones such as EVG). Will discuss further at her visit.  Lab Results  Component Value Date   HIV1RNAQUANT 23 (H) 04/21/2017   HIV1RNAQUANT 25 (H) 06/04/2016   HIV1RNAQUANT 86,200 (H) 11/29/2015   Lab Results  Component Value Date   CD4TABS 230 (L) 04/21/2017   CD4TABS 190 (L) 11/07/2016   CD4TABS 80 (L) 06/04/2016     Past Medical History:  Diagnosis Date  . AIDS (HCC) 12/07/2015  . Emphysema of lung (HCC) 01/02/2017  . Rash and nonspecific skin eruption 12/07/2015  . Smoker 01/02/2017  . Thrush 12/07/2015    No past surgical history on file.  No family history on file.    Social History   Social  History  . Marital status: Married    Spouse name: N/A  . Number of children: N/A  . Years of education: N/A   Social History Main Topics  . Smoking status: Current Every Day Smoker    Packs/day: 0.60    Types: Cigarettes  . Smokeless tobacco: Never Used     Comment: cutting back  . Alcohol use 1.2 oz/week    2 Standard drinks or equivalent per week     Comment: occasional  . Drug use: No  . Sexual activity: Yes    Partners: Female    Birth control/ protection: Condom     Comment: epted condoms   Other Topics Concern  . None   Social History Narrative  . None    No Known Allergies   Current Outpatient Prescriptions:  .  dolutegravir (TIVICAY) 50 MG tablet, Take 1 tablet (50 mg total) by mouth daily., Disp: 30 tablet, Rfl: 11 .  emtricitabine-tenofovir AF (DESCOVY) 200-25 MG tablet, Take 1 tablet by mouth daily., Disp: 30 tablet, Rfl: 11 .  sulfamethoxazole-trimethoprim (BACTRIM DS,SEPTRA DS) 800-160 MG tablet, Take 1 tablet by mouth 3 (three) times a week., Disp: 20 tablet, Rfl: 4 .  triamcinolone ointment (KENALOG) 0.5 %, Apply 1 application topically  2 (two) times daily., Disp: 30 g, Rfl: 1   Review of Systems  Constitutional: Negative for activity change, appetite change, chills, diaphoresis, fatigue, fever and unexpected weight change.  HENT: Negative for congestion, rhinorrhea, sinus pressure and sneezing.   Eyes: Negative for photophobia and visual disturbance.  Respiratory: Negative for chest tightness, shortness of breath, wheezing and stridor.   Cardiovascular: Negative for chest pain, palpitations and leg swelling.  Gastrointestinal: Negative for abdominal distention, abdominal pain, anal bleeding, blood in stool, constipation, diarrhea, nausea and vomiting.  Genitourinary: Negative for difficulty urinating, dysuria, flank pain and hematuria.  Musculoskeletal: Negative for arthralgias, back pain, gait problem, joint swelling and myalgias.  Skin: Negative for  pallor and wound.  Neurological: Negative for dizziness, tremors, weakness and light-headedness.  Hematological: Negative for adenopathy. Does not bruise/bleed easily.  Psychiatric/Behavioral: Negative for agitation, behavioral problems, confusion, decreased concentration, dysphoric mood and sleep disturbance.       Objective:   Physical Exam  Constitutional: He is oriented to person, place, and time. He appears well-developed and well-nourished.  HENT:  Head: Normocephalic and atraumatic.  Eyes: Conjunctivae and EOM are normal.  Neck: Normal range of motion. Neck supple.  Cardiovascular: Normal rate and regular rhythm.   Pulmonary/Chest: Effort normal. No respiratory distress. He has no wheezes.  Abdominal: Soft. He exhibits no distension.  Musculoskeletal: Normal range of motion. He exhibits no edema or tenderness.  Neurological: He is alert and oriented to person, place, and time.  Skin: Skin is warm and dry. No pallor.  Psychiatric: He has a normal mood and affect. Judgment and thought content normal. He is withdrawn.          Assessment & Plan:   AIDS:  Continue Tivicay, Descovy. OK to dc bactrim  Smoker:  Needs to quit and needs flu vax but refused despite counseling  He may come back later for flu shot

## 2017-05-29 MED FILL — DESCOVY 200-25 MG TABS: 200-25 | 30 days supply | Qty: 30 | Fill #4

## 2017-05-29 MED FILL — TIVICAY 50 MG TABLET: 50 | 30 days supply | Qty: 30 | Fill #4

## 2017-06-26 ENCOUNTER — Other Ambulatory Visit: Payer: Self-pay | Admitting: Pharmacist

## 2017-06-26 MED FILL — DESCOVY 200-25 MG TABS: 200-25 | 30 days supply | Qty: 30 | Fill #5

## 2017-06-26 MED FILL — TIVICAY 50 MG TABLET: 50 | 30 days supply | Qty: 30 | Fill #5

## 2017-08-05 MED FILL — TIVICAY 50 MG TABLET: 50 | 30 days supply | Qty: 30 | Fill #6

## 2017-08-05 MED FILL — DESCOVY 200-25 MG TABS: 200-25 | 30 days supply | Qty: 30 | Fill #6

## 2017-09-12 MED FILL — DESCOVY 200-25 MG TABS: 200-25 | 30 days supply | Qty: 30 | Fill #7

## 2017-09-12 MED FILL — TIVICAY 50 MG TABLET: 50 | 30 days supply | Qty: 30 | Fill #7

## 2017-09-19 ENCOUNTER — Other Ambulatory Visit: Payer: Self-pay | Admitting: Pharmacist

## 2017-11-03 MED FILL — DESCOVY 200-25 MG TABS: 200-25 | 30 days supply | Qty: 30 | Fill #8

## 2017-11-03 MED FILL — SHIPPING COST: 1 days supply | Qty: 1 | Fill #0

## 2017-11-03 MED FILL — TIVICAY 50 MG TABLET: 50 | 30 days supply | Qty: 30 | Fill #8

## 2017-11-10 ENCOUNTER — Other Ambulatory Visit: Payer: 59

## 2017-11-10 ENCOUNTER — Other Ambulatory Visit (HOSPITAL_COMMUNITY)
Admission: RE | Admit: 2017-11-10 | Discharge: 2017-11-10 | Disposition: A | Discharge status: Home / Self Care | Payer: 59 | Source: Ambulatory Visit | Attending: Infectious Disease | Admitting: Infectious Disease

## 2017-11-10 DIAGNOSIS — Z79899 Other long term (current) drug therapy: Secondary | ICD-10-CM | POA: Insufficient documentation

## 2017-11-10 DIAGNOSIS — F17200 Nicotine dependence, unspecified, uncomplicated: Secondary | ICD-10-CM | POA: Diagnosis not present

## 2017-11-10 DIAGNOSIS — Z113 Encounter for screening for infections with a predominantly sexual mode of transmission: Secondary | ICD-10-CM | POA: Insufficient documentation

## 2017-11-10 DIAGNOSIS — B20 Human immunodeficiency virus [HIV] disease: Secondary | ICD-10-CM | POA: Insufficient documentation

## 2017-11-10 DIAGNOSIS — Z23 Encounter for immunization: Secondary | ICD-10-CM | POA: Diagnosis not present

## 2017-11-11 LAB — CBC WITH DIFFERENTIAL/PLATELET
BASOS PCT: 0.7 %
Basophils Absolute: 32 cells/uL (ref 0–200)
EOS PCT: 3.1 %
Eosinophils Absolute: 140 cells/uL (ref 15–500)
HCT: 39.1 % (ref 38.5–50.0)
HEMOGLOBIN: 13.6 g/dL (ref 13.2–17.1)
Lymphs Abs: 1674 cells/uL (ref 850–3900)
MCH: 31.6 pg (ref 27.0–33.0)
MCHC: 34.8 g/dL (ref 32.0–36.0)
MCV: 90.9 fL (ref 80.0–100.0)
MONOS PCT: 10 %
MPV: 11 fL (ref 7.5–12.5)
NEUTROS ABS: 2205 {cells}/uL (ref 1500–7800)
Neutrophils Relative %: 49 %
PLATELETS: 203 10*3/uL (ref 140–400)
RBC: 4.3 10*6/uL (ref 4.20–5.80)
RDW: 12.7 % (ref 11.0–15.0)
TOTAL LYMPHOCYTE: 37.2 %
WBC mixed population: 450 cells/uL (ref 200–950)
WBC: 4.5 10*3/uL (ref 3.8–10.8)

## 2017-11-11 LAB — COMPLETE METABOLIC PANEL WITH GFR
AG RATIO: 1.4 (calc) (ref 1.0–2.5)
ALKALINE PHOSPHATASE (APISO): 89 U/L (ref 40–115)
ALT: 15 U/L (ref 9–46)
AST: 19 U/L (ref 10–40)
Albumin: 4 g/dL (ref 3.6–5.1)
BUN: 10 mg/dL (ref 7–25)
CALCIUM: 9 mg/dL (ref 8.6–10.3)
CO2: 24 mmol/L (ref 20–32)
Chloride: 108 mmol/L (ref 98–110)
Creat: 1.17 mg/dL (ref 0.60–1.35)
GFR, EST NON AFRICAN AMERICAN: 81 mL/min/{1.73_m2} (ref >=60)
GFR, Est African American: 94 mL/min/{1.73_m2} (ref >=60)
Globulin: 2.9 g/dL (calc) (ref 1.9–3.7)
Glucose, Bld: 109 mg/dL — ABNORMAL HIGH (ref 65–99)
POTASSIUM: 4.1 mmol/L (ref 3.5–5.3)
SODIUM: 140 mmol/L (ref 135–146)
Total Bilirubin: 0.5 mg/dL (ref 0.2–1.2)
Total Protein: 6.9 g/dL (ref 6.1–8.1)

## 2017-11-11 LAB — LIPID PANEL
Cholesterol: 114 mg/dL (ref <200)
HDL: 34 mg/dL — AB (ref >40)
LDL CHOLESTEROL (CALC): 69 mg/dL
NON-HDL CHOLESTEROL (CALC): 80 mg/dL (ref <130)
Total CHOL/HDL Ratio: 3.4 (calc) (ref <5.0)
Triglycerides: 40 mg/dL (ref <150)

## 2017-11-11 LAB — T-HELPER CELL (CD4) - (RCID CLINIC ONLY)
CD4 % Helper T Cell: 13 % — ABNORMAL LOW (ref 33–55)
CD4 T Cell Abs: 240 /uL — ABNORMAL LOW (ref 400–2700)

## 2017-11-11 LAB — URINE CYTOLOGY ANCILLARY ONLY
Chlamydia: NEGATIVE
Neisseria Gonorrhea: NEGATIVE

## 2017-11-11 LAB — RPR: RPR Ser Ql: NONREACTIVE

## 2017-11-12 LAB — HIV-1 RNA QUANT-NO REFLEX-BLD
HIV 1 RNA Quant: <20 copies/mL
HIV-1 RNA Quant, Log: <1.3 Log copies/mL

## 2017-11-24 ENCOUNTER — Encounter: Payer: Self-pay | Admitting: Infectious Disease

## 2017-11-24 ENCOUNTER — Ambulatory Visit: Payer: 59 | Admitting: Infectious Disease

## 2017-11-24 VITALS — BP 121/76 | HR 71 | Temp 98.1°F | Ht 69.0 in | Wt 182.0 lb

## 2017-11-24 DIAGNOSIS — Z79899 Other long term (current) drug therapy: Secondary | ICD-10-CM

## 2017-11-24 DIAGNOSIS — F17200 Nicotine dependence, unspecified, uncomplicated: Secondary | ICD-10-CM

## 2017-11-24 DIAGNOSIS — B20 Human immunodeficiency virus [HIV] disease: Secondary | ICD-10-CM | POA: Diagnosis not present

## 2017-11-24 DIAGNOSIS — Z113 Encounter for screening for infections with a predominantly sexual mode of transmission: Secondary | ICD-10-CM | POA: Diagnosis not present

## 2017-11-24 MED ORDER — DOLUTEGRAVIR SODIUM 50 MG PO TABS: 50.0000 mg | ORAL_TABLET | Freq: Every day | ORAL | 11 refills | Status: DC

## 2017-11-24 MED ORDER — EMTRICITABINE-TENOFOVIR AF 200-25 MG PO TABS: 1.0000 | ORAL_TABLET | Freq: Every day | ORAL | 11 refills | Status: DC

## 2017-11-24 NOTE — Progress Notes (Signed)
Subjective:   Chief complaint: followup for HIV on medications   Patient ID: Jeremy MeierMichael S Harper, male    DOB: 03-28-1981, 37 y.o.   MRN: [id number]  HPI   [name] who was previoulsy well conntrolled HIV, on Atripla. He had apparently lost insurance and he tells me for this reason had not been on his ARV since 2013 when last seen by Traci SermonBrad Farrington.   In the interim he had married and we actually have just seen his wife who developed acute HIV in the past month.   Unfortunately when he came back into care his HIV was completely out of control with VL in the 80 K range and his CD4 has plummeted to <10.  We discussed d various regimens and ultimately decided to start him on TIvicay and Descovy. Since then he has achieved fairly nice virological suppression with most recent viral load of 27 CD4 count is gone up to 190 and now above 200.  We discussed changing to STR but he is happy with TIvicay and Descovy.  He has come back to clinic today again and is doing well.  He has reviewed his labs on my chart and happy that his viral load is suppressed and his CD4 count coming up he asked why he still has diagnosis AIDS listed.  I explained to him that frequently this diagnosis is left in the chart due to billing reasons but that I was changing his diagnosis to HIV disease for billing purposes for today.      Lab Results  Component Value Date   HIV1RNAQUANT <20 NOT DETECTED 11/10/2017   HIV1RNAQUANT 23 (H) 04/21/2017   HIV1RNAQUANT 25 (H) 06/04/2016   Lab Results  Component Value Date   CD4TABS 240 (L) 11/10/2017   CD4TABS 230 (L) 04/21/2017   CD4TABS 190 (L) 11/07/2016     Past Medical History:  Diagnosis Date  . AIDS (HCC) 12/07/2015  . Emphysema of lung (HCC) 01/02/2017  . Rash and nonspecific skin eruption 12/07/2015  . Smoker 01/02/2017  . Thrush 12/07/2015    No past surgical history on file.  No family history on file.    Social History    Socioeconomic History  . Marital status: Married    Spouse name: None  . Number of children: None  . Years of education: None  . Highest education level: None  Social Needs  . Financial resource strain: None  . Food insecurity - worry: None  . Food insecurity - inability: None  . Transportation needs - medical: None  . Transportation needs - non-medical: None  Occupational History  . None  Tobacco Use  . Smoking status: Current Every Day Smoker    Packs/day: 0.60    Types: Cigarettes  . Smokeless tobacco: Never Used  . Tobacco comment: cutting back  Substance and Sexual Activity  . Alcohol use: Yes    Alcohol/week: 1.2 oz    Types: 2 Standard drinks or equivalent per week    Comment: occasional  . Drug use: No  . Sexual activity: Yes    Partners: Female    Birth control/protection: Condom    Comment: epted condoms  Other Topics Concern  . None  Social History Narrative  . None    No Known Allergies   Current Outpatient Medications:  .  dolutegravir (TIVICAY) 50 MG tablet, Take 1 tablet (50 mg total) by mouth daily., Disp: 30 tablet, Rfl: 11 .  emtricitabine-tenofovir AF (DESCOVY) 200-25 MG  tablet, Take 1 tablet by mouth daily., Disp: 30 tablet, Rfl: 11 .  triamcinolone ointment (KENALOG) 0.5 %, Apply 1 application topically 2 (two) times daily., Disp: 30 g, Rfl: 1   Review of Systems  Constitutional: Negative for activity change, appetite change, chills, diaphoresis, fatigue, fever and unexpected weight change.  HENT: Negative for congestion, rhinorrhea, sinus pressure and sneezing.   Eyes: Negative for photophobia and visual disturbance.  Respiratory: Negative for chest tightness, shortness of breath, wheezing and stridor.   Cardiovascular: Negative for chest pain, palpitations and leg swelling.  Gastrointestinal: Negative for abdominal distention, abdominal pain, anal bleeding, blood in stool, constipation, diarrhea, nausea and vomiting.  Genitourinary:  Negative for difficulty urinating, dysuria, flank pain and hematuria.  Musculoskeletal: Negative for arthralgias, back pain, gait problem, joint swelling and myalgias.  Skin: Negative for pallor and wound.  Neurological: Negative for dizziness, tremors, weakness and light-headedness.  Hematological: Negative for adenopathy. Does not bruise/bleed easily.  Psychiatric/Behavioral: Negative for agitation, behavioral problems, confusion, decreased concentration, dysphoric mood and sleep disturbance.       Objective:   Physical Exam  Constitutional: He is oriented to person, place, and time. He appears well-developed and well-nourished.  HENT:  Head: Normocephalic and atraumatic.  Eyes: Conjunctivae and EOM are normal.  Neck: Normal range of motion. Neck supple.  Cardiovascular: Normal rate and regular rhythm.  Pulmonary/Chest: Effort normal. No respiratory distress. He has no wheezes.  Abdominal: Soft. He exhibits no distension.  Musculoskeletal: Normal range of motion. He exhibits no edema or tenderness.  Neurological: He is alert and oriented to person, place, and time.  Skin: Skin is warm and dry. No pallor.  Psychiatric: He has a normal mood and affect. Judgment and thought content normal.  Nursing note and vitals reviewed.         Assessment & Plan:   HIV disease:  Continue Tivicay, Descovy and bring back in 4 months time  Smoker:  Needs to quit and he will work on this.

## 2017-11-28 ENCOUNTER — Other Ambulatory Visit: Payer: Self-pay | Admitting: Pharmacist

## 2017-12-01 MED FILL — TIVICAY 50 MG TABLET: 50 | 30 days supply | Qty: 30 | Fill #9

## 2017-12-01 MED FILL — SHIPPING COST: 1 days supply | Qty: 1 | Fill #1

## 2017-12-01 MED FILL — DESCOVY 200-25 MG TABS: 200-25 | 30 days supply | Qty: 30 | Fill #9

## 2017-12-31 MED FILL — DESCOVY 200-25 MG TABS: 200-25 | 30 days supply | Qty: 30 | Fill #10

## 2017-12-31 MED FILL — TIVICAY 50 MG TABLET: 50 | 30 days supply | Qty: 30 | Fill #10

## 2018-01-19 DIAGNOSIS — H52223 Regular astigmatism, bilateral: Secondary | ICD-10-CM | POA: Diagnosis not present

## 2018-01-22 ENCOUNTER — Other Ambulatory Visit: Payer: Self-pay | Admitting: Infectious Disease

## 2018-01-22 DIAGNOSIS — B20 Human immunodeficiency virus [HIV] disease: Secondary | ICD-10-CM

## 2018-01-23 MED FILL — TIVICAY 50 MG TABLET: 50 | 30 days supply | Qty: 30 | Fill #0

## 2018-01-23 MED FILL — DESCOVY 200-25 MG TABS: 200-25 | 30 days supply | Qty: 30 | Fill #0

## 2018-02-19 MED FILL — DESCOVY 200-25 MG TABS: 200-25 | 30 days supply | Qty: 30 | Fill #1

## 2018-02-19 MED FILL — TIVICAY 50 MG TABLET: 50 | 30 days supply | Qty: 30 | Fill #1

## 2018-03-05 ENCOUNTER — Other Ambulatory Visit: Payer: 59

## 2018-03-05 DIAGNOSIS — B20 Human immunodeficiency virus [HIV] disease: Secondary | ICD-10-CM

## 2018-03-05 DIAGNOSIS — F17200 Nicotine dependence, unspecified, uncomplicated: Secondary | ICD-10-CM | POA: Diagnosis not present

## 2018-03-05 DIAGNOSIS — Z79899 Other long term (current) drug therapy: Secondary | ICD-10-CM

## 2018-03-05 DIAGNOSIS — Z113 Encounter for screening for infections with a predominantly sexual mode of transmission: Secondary | ICD-10-CM

## 2018-03-06 LAB — CBC WITH DIFFERENTIAL/PLATELET
BASOS PCT: 1.1 %
Basophils Absolute: 50 cells/uL (ref 0–200)
EOS ABS: 203 {cells}/uL (ref 15–500)
Eosinophils Relative: 4.5 %
HEMATOCRIT: 38.2 % — AB (ref 38.5–50.0)
HEMOGLOBIN: 13.1 g/dL — AB (ref 13.2–17.1)
LYMPHS ABS: 1958 {cells}/uL (ref 850–3900)
MCH: 32 pg (ref 27.0–33.0)
MCHC: 34.3 g/dL (ref 32.0–36.0)
MCV: 93.2 fL (ref 80.0–100.0)
MPV: 10.9 fL (ref 7.5–12.5)
Monocytes Relative: 12.1 %
NEUTROS ABS: 1746 {cells}/uL (ref 1500–7800)
Neutrophils Relative %: 38.8 %
PLATELETS: 213 10*3/uL (ref 140–400)
RBC: 4.1 10*6/uL — AB (ref 4.20–5.80)
RDW: 12.8 % (ref 11.0–15.0)
TOTAL LYMPHOCYTE: 43.5 %
WBC: 4.5 10*3/uL (ref 3.8–10.8)
WBCMIX: 545 {cells}/uL (ref 200–950)

## 2018-03-06 LAB — LIPID PANEL
CHOLESTEROL: 120 mg/dL (ref <200)
HDL: 36 mg/dL — ABNORMAL LOW (ref >40)
LDL Cholesterol (Calc): 70 mg/dL (calc)
Non-HDL Cholesterol (Calc): 84 mg/dL (calc) (ref <130)
Total CHOL/HDL Ratio: 3.3 (calc) (ref <5.0)
Triglycerides: 64 mg/dL (ref <150)

## 2018-03-06 LAB — RPR: RPR Ser Ql: NONREACTIVE

## 2018-03-06 LAB — COMPLETE METABOLIC PANEL WITH GFR
AG Ratio: 1.4 (calc) (ref 1.0–2.5)
ALKALINE PHOSPHATASE (APISO): 86 U/L (ref 40–115)
ALT: 18 U/L (ref 9–46)
AST: 20 U/L (ref 10–40)
Albumin: 4.1 g/dL (ref 3.6–5.1)
BUN: 12 mg/dL (ref 7–25)
CALCIUM: 8.9 mg/dL (ref 8.6–10.3)
CO2: 23 mmol/L (ref 20–32)
CREATININE: 1.27 mg/dL (ref 0.60–1.35)
Chloride: 106 mmol/L (ref 98–110)
GFR, EST NON AFRICAN AMERICAN: 73 mL/min/{1.73_m2} (ref >=60)
GFR, Est African American: 85 mL/min/{1.73_m2} (ref >=60)
GLOBULIN: 2.9 g/dL (ref 1.9–3.7)
GLUCOSE: 131 mg/dL — AB (ref 65–99)
Potassium: 3.8 mmol/L (ref 3.5–5.3)
SODIUM: 138 mmol/L (ref 135–146)
Total Bilirubin: 0.6 mg/dL (ref 0.2–1.2)
Total Protein: 7 g/dL (ref 6.1–8.1)

## 2018-03-06 LAB — T-HELPER CELL (CD4) - (RCID CLINIC ONLY)
CD4 % Helper T Cell: 15 % — ABNORMAL LOW (ref 33–55)
CD4 T Cell Abs: 330 /uL — ABNORMAL LOW (ref 400–2700)

## 2018-03-07 LAB — HIV-1 RNA QUANT-NO REFLEX-BLD
HIV 1 RNA Quant: <20 copies/mL
HIV-1 RNA Quant, Log: <1.3 Log copies/mL

## 2018-03-20 ENCOUNTER — Ambulatory Visit: Payer: 59 | Admitting: Infectious Disease

## 2018-03-26 ENCOUNTER — Ambulatory Visit: Payer: 59 | Admitting: Infectious Disease

## 2018-04-17 MED FILL — DESCOVY 200-25 MG TABS: 200-25 | 30 days supply | Qty: 30 | Fill #2

## 2018-04-17 MED FILL — TIVICAY 50 MG TABLET: 50 | 30 days supply | Qty: 30 | Fill #2

## 2018-05-15 MED FILL — TIVICAY 50 MG TABLET: 50 | 30 days supply | Qty: 30 | Fill #3

## 2018-05-15 MED FILL — DESCOVY 200-25 MG TABS: 200-25 | 30 days supply | Qty: 30 | Fill #3

## 2018-05-28 ENCOUNTER — Ambulatory Visit: Payer: 59 | Admitting: Infectious Diseases

## 2018-07-16 ENCOUNTER — Telehealth: Payer: Self-pay | Admitting: Pharmacy Technician

## 2018-07-16 NOTE — Telephone Encounter (Signed)
[facility] specialty pharmacy called multiple times.  He stated he would pick up his HIV medication, but did not.  He is 32 days past due.

## 2018-07-17 MED FILL — TIVICAY 50 MG TABLET: 50 | 30 days supply | Qty: 30 | Fill #4

## 2018-07-17 MED FILL — DESCOVY 200-25 MG TABS: 200-25 | 30 days supply | Qty: 30 | Fill #4

## 2018-08-12 MED FILL — TIVICAY 50 MG TABLET: 50 | 30 days supply | Qty: 30 | Fill #5

## 2018-08-12 MED FILL — DESCOVY 200-25 MG TABS: 200-25 | 30 days supply | Qty: 30 | Fill #5

## 2018-09-03 MED FILL — TIVICAY 50 MG TABLET: 50 | 30 days supply | Qty: 30 | Fill #5

## 2018-09-03 MED FILL — DESCOVY 200-25 MG TABS: 200-25 | 30 days supply | Qty: 30 | Fill #5

## 2018-09-24 MED FILL — DESCOVY 200-25 MG TABS: 200-25 | 30 days supply | Qty: 30 | Fill #5

## 2018-09-24 MED FILL — TIVICAY 50 MG TABLET: 50 | 30 days supply | Qty: 30 | Fill #5

## 2018-11-02 MED FILL — TIVICAY 50 MG TABLET: 50 | 30 days supply | Qty: 30 | Fill #6

## 2018-11-02 MED FILL — DESCOVY 200-25 MG TABS: 200-25 | 30 days supply | Qty: 30 | Fill #6

## 2018-11-13 ENCOUNTER — Encounter: Payer: 59 | Admitting: Infectious Disease

## 2018-11-26 ENCOUNTER — Other Ambulatory Visit: Payer: Self-pay | Admitting: Infectious Disease

## 2018-11-26 DIAGNOSIS — B20 Human immunodeficiency virus [HIV] disease: Secondary | ICD-10-CM

## 2018-11-26 MED FILL — TIVICAY 50 MG TABLET: 50 | 30 days supply | Qty: 30 | Fill #0

## 2018-11-26 MED FILL — DESCOVY 200-25 MG TABS: 200-25 | 30 days supply | Qty: 30 | Fill #0

## 2018-12-04 ENCOUNTER — Other Ambulatory Visit: Payer: Self-pay | Admitting: *Deleted

## 2018-12-04 DIAGNOSIS — B029 Zoster without complications: Secondary | ICD-10-CM

## 2018-12-04 MED ORDER — VALACYCLOVIR HCL 1 G PO TABS: 1000.0000 mg | ORAL_TABLET | Freq: Three times a day (TID) | ORAL | 0 refills | Status: AC

## 2018-12-04 MED ORDER — VALACYCLOVIR HCL 1 G PO TABS: 1000.0000 mg | ORAL_TABLET | Freq: Three times a day (TID) | ORAL | 0 refills | Status: DC

## 2018-12-04 MED FILL — valACYclovir HCL 1 GM TABS: 1 | 7 days supply | Qty: 21 | Fill #0

## 2018-12-18 ENCOUNTER — Other Ambulatory Visit: Payer: Self-pay | Admitting: Family

## 2018-12-18 ENCOUNTER — Other Ambulatory Visit: Payer: Self-pay | Admitting: Infectious Disease

## 2018-12-18 DIAGNOSIS — B029 Zoster without complications: Secondary | ICD-10-CM

## 2018-12-18 DIAGNOSIS — B20 Human immunodeficiency virus [HIV] disease: Secondary | ICD-10-CM

## 2018-12-28 ENCOUNTER — Encounter: Payer: Self-pay | Admitting: Infectious Disease

## 2018-12-28 ENCOUNTER — Other Ambulatory Visit: Payer: Self-pay

## 2018-12-28 ENCOUNTER — Ambulatory Visit: Payer: 59 | Admitting: Infectious Disease

## 2018-12-28 VITALS — BP 130/81 | HR 92 | Temp 97.9°F | Wt 179.0 lb

## 2018-12-28 DIAGNOSIS — Z79899 Other long term (current) drug therapy: Secondary | ICD-10-CM | POA: Diagnosis not present

## 2018-12-28 DIAGNOSIS — B029 Zoster without complications: Secondary | ICD-10-CM

## 2018-12-28 DIAGNOSIS — B20 Human immunodeficiency virus [HIV] disease: Secondary | ICD-10-CM

## 2018-12-28 DIAGNOSIS — F17200 Nicotine dependence, unspecified, uncomplicated: Secondary | ICD-10-CM

## 2018-12-28 DIAGNOSIS — Z113 Encounter for screening for infections with a predominantly sexual mode of transmission: Secondary | ICD-10-CM | POA: Diagnosis not present

## 2018-12-28 HISTORY — DX: Zoster without complications: B02.9

## 2018-12-28 MED FILL — DESCOVY 200-25 MG TABS: 200-25 | 30 days supply | Qty: 30 | Fill #0

## 2018-12-28 MED FILL — TIVICAY 50 MG TABLET: 50 | 30 days supply | Qty: 30 | Fill #0

## 2018-12-28 NOTE — Progress Notes (Signed)
Subjective:   Chief complaint: followup for HIV on medications   Patient ID: Jeremy Harper, male    DOB: [birth year], 38 y.o.   MRN: [id number]  HPI   38 Year old [place] man who was previoulsy well conntrolled HIV, on Atripla. He had apparently lost insurance and he tells me for this reason had not been on his ARV since 2013 when last seen by Traci Sermon.   In the interim he had married and we actually have just seen his wife who developed acute HIV   Unfortunately when he came back into care his HIV was completely out of control with VL in the 80 K range and his CD4 has plummeted to <10.  Teena Dunk however Meril is done quite well and has gotten his virus under control and is typically with an undetectable viral load and healthy CD4 count.  He has not had labs since June 2019 so we definitely need to get them today.  Since I last saw him he and his wife have had a baby.  He did suffer from what sounds like an outbreak of zoster that was occurring in a cervical dermatome.  He did not have as much pain as he had read he would typically have an injury I would typically expect but he is certainly younger.  He was given a prescription for Valtrex by Rexene Alberts.  We discussed potential switches to a single tablet regimen in the future such as Biktarvy or Dovato, refer not to me that change now which would be need have to make more frequent clinic visits.        Lab Results  Component Value Date   HIV1RNAQUANT <20 NOT DETECTED 03/05/2018   HIV1RNAQUANT <20 NOT DETECTED 11/10/2017   HIV1RNAQUANT 23 (H) 04/21/2017   Lab Results  Component Value Date   CD4TABS 330 (L) 03/05/2018   CD4TABS 240 (L) 11/10/2017   CD4TABS 230 (L) 04/21/2017     Past Medical History:  Diagnosis Date  . AIDS (HCC) 12/07/2015  . Emphysema of lung (HCC) 01/02/2017  . Rash and nonspecific skin eruption 12/07/2015  . Smoker 01/02/2017  . Thrush 12/07/2015    No past surgical  history on file.  No family history on file.    Social History   Socioeconomic History  . Marital status: Married    Spouse name: Not on file  . Number of children: Not on file  . Years of education: Not on file  . Highest education level: Not on file  Occupational History  . Not on file  Social Needs  . Financial resource strain: Not on file  . Food insecurity:    Worry: Not on file    Inability: Not on file  . Transportation needs:    Medical: Not on file    Non-medical: Not on file  Tobacco Use  . Smoking status: Current Every Day Smoker    Packs/day: 0.60    Types: Cigarettes  . Smokeless tobacco: Never Used  . Tobacco comment: cutting back  Substance and Sexual Activity  . Alcohol use: Yes    Alcohol/week: 2.0 standard drinks    Types: 2 Standard drinks or equivalent per week    Comment: occasional  . Drug use: No    Frequency: 2.0 times per week  . Sexual activity: Yes    Partners: Female    Birth control/protection: Condom    Comment: epted condoms  Lifestyle  . Physical activity:    Days  per week: Not on file    Minutes per session: Not on file  . Stress: Not on file  Relationships  . Social connections:    Talks on phone: Not on file    Gets together: Not on file    Attends religious service: Not on file    Active member of club or organization: Not on file    Attends meetings of clubs or organizations: Not on file    Relationship status: Not on file  Other Topics Concern  . Not on file  Social History Narrative  . Not on file    No Known Allergies   Current Outpatient Medications:  .  DESCOVY 200-25 MG tablet, TAKE 1 TABLET BY MOUTH DAILY., Disp: 30 tablet, Rfl: 0 .  TIVICAY 50 MG tablet, TAKE 1 TABLET (50 MG TOTAL) BY MOUTH DAILY., Disp: 30 tablet, Rfl: 0 .  triamcinolone ointment (KENALOG) 0.5 %, Apply 1 application topically 2 (two) times daily., Disp: 30 g, Rfl: 1   Review of Systems  Constitutional: Negative for activity change,  appetite change, chills, diaphoresis, fatigue, fever and unexpected weight change.  HENT: Negative for congestion, rhinorrhea, sinus pressure and sneezing.   Eyes: Negative for photophobia and visual disturbance.  Respiratory: Negative for chest tightness, shortness of breath, wheezing and stridor.   Cardiovascular: Negative for chest pain, palpitations and leg swelling.  Gastrointestinal: Negative for abdominal distention, abdominal pain, anal bleeding, blood in stool, constipation, diarrhea, nausea and vomiting.  Genitourinary: Negative for difficulty urinating, dysuria, flank pain and hematuria.  Musculoskeletal: Negative for arthralgias, back pain, gait problem, joint swelling and myalgias.  Skin: Positive for rash. Negative for pallor and wound.  Neurological: Negative for dizziness, tremors, weakness and light-headedness.  Hematological: Negative for adenopathy. Does not bruise/bleed easily.  Psychiatric/Behavioral: Negative for agitation, behavioral problems, confusion, decreased concentration, dysphoric mood and sleep disturbance. The patient is not hyperactive.        Objective:   Physical Exam  Constitutional: He is oriented to person, place, and time. He appears well-developed and well-nourished.  HENT:  Head: Normocephalic and atraumatic.  Eyes: Conjunctivae and EOM are normal.  Neck: Normal range of motion. Neck supple.  Cardiovascular: Normal rate and regular rhythm.  Pulmonary/Chest: Effort normal. No respiratory distress. He has no wheezes.  Abdominal: Soft. He exhibits no distension.  Musculoskeletal: Normal range of motion.        General: No tenderness or edema.  Neurological: He is alert and oriented to person, place, and time.  Skin: Skin is warm and dry. No pallor.  Psychiatric: He has a normal mood and affect. His behavior is normal. Judgment and thought content normal.  Nursing note and vitals reviewed.         Assessment & Plan:   HIV disease:  Continue  Tivicay, Descovy, check labs today set him up to come back in roughly 4 months time we can potentially discuss a switch to a single tablet regimen and then  Zoster eruption is solved but still has some scars from this  Smoking: Really needs to stop this to optimize the health of his baby and also for his own health and to avoid novel coronavirus 2019  I spent greater than 25 minutes with the patient including greater than 50% of time in face to face counsel of the patient different single tablet regimens we could switch him to regarding need to stop smoking, and optimal ways to shelter in place and avoid novel coronavirus 2019 and in coordination  of his care.

## 2018-12-29 LAB — COMPLETE METABOLIC PANEL WITH GFR
AG Ratio: 1.5 (calc) (ref 1.0–2.5)
ALT: 18 U/L (ref 9–46)
AST: 18 U/L (ref 10–40)
Albumin: 4.1 g/dL (ref 3.6–5.1)
Alkaline phosphatase (APISO): 94 U/L (ref 36–130)
BUN: 8 mg/dL (ref 7–25)
CO2: 26 mmol/L (ref 20–32)
Calcium: 9.3 mg/dL (ref 8.6–10.3)
Chloride: 108 mmol/L (ref 98–110)
Creat: 1.04 mg/dL (ref 0.60–1.35)
GFR, Est African American: 107 mL/min/{1.73_m2} (ref >=60)
GFR, Est Non African American: 93 mL/min/{1.73_m2} (ref >=60)
Globulin: 2.8 g/dL (calc) (ref 1.9–3.7)
Glucose, Bld: 83 mg/dL (ref 65–99)
Potassium: 4.1 mmol/L (ref 3.5–5.3)
Sodium: 140 mmol/L (ref 135–146)
Total Bilirubin: 0.3 mg/dL (ref 0.2–1.2)
Total Protein: 6.9 g/dL (ref 6.1–8.1)

## 2018-12-29 LAB — CBC WITH DIFFERENTIAL/PLATELET
Absolute Monocytes: 502 cells/uL (ref 200–950)
Basophils Absolute: 30 cells/uL (ref 0–200)
Basophils Relative: 0.5 %
Eosinophils Absolute: 201 cells/uL (ref 15–500)
Eosinophils Relative: 3.4 %
HCT: 39.4 % (ref 38.5–50.0)
Hemoglobin: 13.5 g/dL (ref 13.2–17.1)
Lymphs Abs: 2401 cells/uL (ref 850–3900)
MCH: 31.3 pg (ref 27.0–33.0)
MCHC: 34.3 g/dL (ref 32.0–36.0)
MCV: 91.4 fL (ref 80.0–100.0)
MPV: 11.2 fL (ref 7.5–12.5)
Monocytes Relative: 8.5 %
Neutro Abs: 2767 cells/uL (ref 1500–7800)
Neutrophils Relative %: 46.9 %
Platelets: 182 10*3/uL (ref 140–400)
RBC: 4.31 10*6/uL (ref 4.20–5.80)
RDW: 13.1 % (ref 11.0–15.0)
Total Lymphocyte: 40.7 %
WBC: 5.9 10*3/uL (ref 3.8–10.8)

## 2018-12-29 LAB — LIPID PANEL
Cholesterol: 138 mg/dL (ref <200)
HDL: 44 mg/dL (ref >=40)
LDL Cholesterol (Calc): 80 mg/dL (calc)
Non-HDL Cholesterol (Calc): 94 mg/dL (calc) (ref <130)
Total CHOL/HDL Ratio: 3.1 (calc) (ref <5.0)
Triglycerides: 62 mg/dL (ref <150)

## 2018-12-29 LAB — RPR: RPR Ser Ql: NONREACTIVE

## 2018-12-29 LAB — T-HELPER CELL (CD4) - (RCID CLINIC ONLY)
CD4 % Helper T Cell: 17 % — ABNORMAL LOW (ref 33–55)
CD4 T Cell Abs: 380 /uL — ABNORMAL LOW (ref 400–2700)

## 2019-01-01 LAB — HIV RNA, RTPCR W/R GT (RTI, PI,INT)
HIV 1 RNA Quant: 23 copies/mL — ABNORMAL HIGH
HIV-1 RNA Quant, Log: 1.36 Log copies/mL — ABNORMAL HIGH

## 2019-01-11 ENCOUNTER — Other Ambulatory Visit: Payer: Self-pay

## 2019-01-11 ENCOUNTER — Encounter: Payer: Self-pay | Admitting: Infectious Disease

## 2019-01-11 ENCOUNTER — Ambulatory Visit (INDEPENDENT_AMBULATORY_CARE_PROVIDER_SITE_OTHER): Payer: 59 | Admitting: Infectious Disease

## 2019-01-11 DIAGNOSIS — F17200 Nicotine dependence, unspecified, uncomplicated: Secondary | ICD-10-CM

## 2019-01-11 DIAGNOSIS — B20 Human immunodeficiency virus [HIV] disease: Secondary | ICD-10-CM | POA: Diagnosis not present

## 2019-01-11 DIAGNOSIS — F17210 Nicotine dependence, cigarettes, uncomplicated: Secondary | ICD-10-CM

## 2019-01-11 DIAGNOSIS — R21 Rash and other nonspecific skin eruption: Secondary | ICD-10-CM | POA: Diagnosis not present

## 2019-01-11 NOTE — Progress Notes (Signed)
Virtual Visit via Telephone Note  I connected with Jeremy Harper on 01/11/19 at  9:15 AM EDT by telephone and verified that I am speaking with the correct person using two identifiers.  Location: Patient: home Provider: [facility]   I discussed the limitations, risks, security and privacy concerns of performing an evaluation and management service by telephone and the availability of in person appointments. I also discussed with the patient that there may be a patient responsible charge related to this service. The patient expressed understanding and agreed to proceed.   History of Present Illness:  38 year old African-American man living with HIV that has been recently quite well controlled on TIVICAY and Descovy.  I had seen him on April 20 in clinic and we had drawn labs.  The purpose of this telephone visit was to review his labs.  Him quite happy with how well he is doing with almost perfect virological suppression and improvement in his CD4 count to near normal.  The rest of his labs were unremarkable.  He is happy with current regimen.  We will consider changing him to [facility] for simplification sake but do this in the future.  He is trying to work hard to stop smoking altogether.      Observations/Objective:  HIV disease well-controlled  Smoking   Assessment and Plan:  HIV disease: Continue Tivicay and Descovy and have scheduled follow-up visit  Smoking: He is working hard to stop smoking altogether did not want to try Chantix but thinks he can cut down continuously on his own.  Follow Up Instructions:    I discussed the assessment and treatment plan with the patient. The patient was provided an opportunity to ask questions and all were answered. The patient agreed with the plan and demonstrated an understanding of the instructions.   The patient was advised to call back or seek an in-person evaluation if the symptoms worsen or if the condition fails to improve as  anticipated.  I provided 15  minutes of non-face-to-face time during this encounter.   Acey Lav, MD

## 2019-01-22 ENCOUNTER — Other Ambulatory Visit: Payer: Self-pay | Admitting: Infectious Disease

## 2019-01-22 DIAGNOSIS — B20 Human immunodeficiency virus [HIV] disease: Secondary | ICD-10-CM

## 2019-01-22 MED FILL — DESCOVY 200-25 MG TABS: 200-25 | 30 days supply | Qty: 30 | Fill #0

## 2019-01-22 MED FILL — TIVICAY 50 MG TABLET: 50 | 30 days supply | Qty: 30 | Fill #0

## 2019-02-23 MED FILL — TIVICAY 50 MG TABLET: 50 | 30 days supply | Qty: 30 | Fill #1

## 2019-02-23 MED FILL — DESCOVY 200-25 MG TABS: 200-25 | 30 days supply | Qty: 30 | Fill #1

## 2019-03-22 MED FILL — TIVICAY 50 MG TABLET: 50 | 30 days supply | Qty: 30 | Fill #2

## 2019-03-22 MED FILL — DESCOVY 200-25 MG TABS: 200-25 | 30 days supply | Qty: 30 | Fill #2

## 2019-04-19 ENCOUNTER — Telehealth: Payer: Self-pay | Admitting: *Deleted

## 2019-04-19 ENCOUNTER — Other Ambulatory Visit: Payer: Self-pay

## 2019-04-19 DIAGNOSIS — Z20822 Contact with and (suspected) exposure to covid-19: Secondary | ICD-10-CM

## 2019-04-19 MED FILL — TIVICAY 50 MG TABLET: 50 | 30 days supply | Qty: 30 | Fill #3

## 2019-04-19 MED FILL — DESCOVY 200-25 MG TABS: 200-25 | 30 days supply | Qty: 30 | Fill #3

## 2019-04-19 NOTE — Telephone Encounter (Signed)
Patient called to see where he could go for covid testing. He was exposed at work, was told to go get a test.  He will follow up with his employer's policies regarding return to work. Landis Gandy, RN

## 2019-04-20 LAB — NOVEL CORONAVIRUS, NAA: SARS-CoV-2, NAA: NOT DETECTED

## 2019-04-23 ENCOUNTER — Other Ambulatory Visit: Payer: Self-pay

## 2019-04-23 DIAGNOSIS — Z20822 Contact with and (suspected) exposure to covid-19: Secondary | ICD-10-CM

## 2019-04-25 LAB — NOVEL CORONAVIRUS, NAA: SARS-CoV-2, NAA: NOT DETECTED

## 2019-05-18 MED FILL — DESCOVY 200-25 MG TABS: 200-25 | 30 days supply | Qty: 30 | Fill #4

## 2019-05-18 MED FILL — TIVICAY 50 MG TABLET: 50 | 30 days supply | Qty: 30 | Fill #4

## 2019-05-24 ENCOUNTER — Other Ambulatory Visit: Payer: Self-pay

## 2019-05-24 ENCOUNTER — Encounter: Payer: Self-pay | Admitting: Infectious Disease

## 2019-05-24 ENCOUNTER — Ambulatory Visit: Payer: 59 | Admitting: Infectious Disease

## 2019-05-24 ENCOUNTER — Other Ambulatory Visit (HOSPITAL_COMMUNITY)
Admission: RE | Admit: 2019-05-24 | Discharge: 2019-05-24 | Disposition: A | Discharge status: Home / Self Care | Payer: 59 | Source: Ambulatory Visit | Attending: Infectious Disease | Admitting: Infectious Disease

## 2019-05-24 VITALS — BP 122/73 | HR 96 | Temp 98.6°F

## 2019-05-24 DIAGNOSIS — R21 Rash and other nonspecific skin eruption: Secondary | ICD-10-CM | POA: Diagnosis not present

## 2019-05-24 DIAGNOSIS — F17200 Nicotine dependence, unspecified, uncomplicated: Secondary | ICD-10-CM | POA: Insufficient documentation

## 2019-05-24 DIAGNOSIS — B20 Human immunodeficiency virus [HIV] disease: Secondary | ICD-10-CM | POA: Insufficient documentation

## 2019-05-24 DIAGNOSIS — J430 Unilateral pulmonary emphysema [MacLeod's syndrome]: Secondary | ICD-10-CM

## 2019-05-24 MED ORDER — TRIAMCINOLONE ACETONIDE 0.5 % EX OINT: 1.0000 "application " | TOPICAL_OINTMENT | Freq: Two times a day (BID) | CUTANEOUS | 1 refills | Status: DC

## 2019-05-24 MED ORDER — DESCOVY 200-25 MG PO TABS: 1.0000 | ORAL_TABLET | Freq: Every day | ORAL | 2 refills | Status: DC

## 2019-05-24 MED ORDER — TIVICAY 50 MG PO TABS: ORAL_TABLET | ORAL | 3 refills | Status: DC

## 2019-05-24 MED FILL — TRIAMCINOLONE 0.5% OINTMENT: 0.5 | 15 days supply | Qty: 30 | Fill #0

## 2019-05-24 NOTE — Progress Notes (Signed)
Subjective:   Chief complaint: followup for HIV on medications   Patient ID: Jeremy Harper, male    DOB: 08/31/1981, 38 y.o.   MRN: [id number]  HPI   38 Year old [place] man who was previoulsy well conntrolled HIV, on Atripla. He had apparently lost insurance and he tells me for this reason had not been on his ARV since 2013 when last seen by Jeremy Harper.   In the interim he had married and we actually have just seen his wife who developed acute HIV   Unfortunately when he came back into care his HIV was completely out of control with VL in the 80 K range and his CD4 has plummeted to <10.  Since then however Jeremy Harper is done quite well and has gotten his virus under control and is typically with an undetectable viral load and healthy CD4 count.  His wife and he have both had a baby since then.   He has preferred to stay on his 2 fill regimen versus changing to a single tablet regimen.  He was offered and refused influenza vaccine and pneumonia vaccine.  He has been trying to cut down on smoking but it still is an issue.  He did have at least one coworker at [employer] where he worked apparently test positive for Jeremy Harper and he himself was tested twice and tested negative he has not had symptoms.        Lab Results  Component Value Date   HIV1RNAQUANT 23 (H) 12/28/2018   HIV1RNAQUANT <20 NOT DETECTED 03/05/2018   HIV1RNAQUANT <20 NOT DETECTED 11/10/2017   Lab Results  Component Value Date   CD4TABS 380 (L) 12/28/2018   CD4TABS 330 (L) 03/05/2018   CD4TABS 240 (L) 11/10/2017     Past Medical History:  Diagnosis Date  . AIDS (HCC) 12/07/2015  . Emphysema of lung (HCC) 01/02/2017  . Rash and nonspecific skin eruption 12/07/2015  . Smoker 01/02/2017  . Thrush 12/07/2015  . Zoster 12/28/2018    No past surgical history on file.  No family history on file.    Social History   Socioeconomic History  . Marital status: Married    Spouse name: Not  on file  . Number of children: Not on file  . Years of education: Not on file  . Highest education level: Not on file  Occupational History  . Not on file  Social Needs  . Financial resource strain: Not on file  . Food insecurity    Worry: Not on file    Inability: Not on file  . Transportation needs    Medical: Not on file    Non-medical: Not on file  Tobacco Use  . Smoking status: Current Every Day Smoker    Packs/day: 0.60    Types: Cigarettes  . Smokeless tobacco: Never Used  . Tobacco comment: cutting back  Substance and Sexual Activity  . Alcohol use: Yes    Alcohol/week: 2.0 standard drinks    Types: 2 Standard drinks or equivalent per week    Comment: occasional  . Drug use: No    Frequency: 2.0 times per week  . Sexual activity: Yes    Partners: Female    Birth control/protection: Condom    Comment: epted condoms  Lifestyle  . Physical activity    Days per week: Not on file    Minutes per session: Not on file  . Stress: Not on file  Relationships  . Social connections  Talks on phone: Not on file    Gets together: Not on file    Attends religious service: Not on file    Active member of club or organization: Not on file    Attends meetings of clubs or organizations: Not on file    Relationship status: Not on file  Other Topics Concern  . Not on file  Social History Narrative  . Not on file    No Known Allergies   Current Outpatient Medications:  .  DESCOVY 200-25 MG tablet, TAKE 1 TABLET BY MOUTH DAILY., Disp: 30 tablet, Rfl: 4 .  TIVICAY 50 MG tablet, TAKE 1 TABLET (50 MG TOTAL) BY MOUTH DAILY., Disp: 30 tablet, Rfl: 4 .  triamcinolone ointment (KENALOG) 0.5 %, Apply 1 application topically 2 (two) times daily., Disp: 30 g, Rfl: 1   Review of Systems  Constitutional: Negative for activity change, appetite change, chills, diaphoresis, fatigue, fever and unexpected weight change.  HENT: Negative for congestion, rhinorrhea, sinus pressure and  sneezing.   Eyes: Negative for photophobia and visual disturbance.  Respiratory: Negative for chest tightness, shortness of breath, wheezing and stridor.   Cardiovascular: Negative for chest pain, palpitations and leg swelling.  Gastrointestinal: Negative for abdominal distention, abdominal pain, anal bleeding, blood in stool, constipation, diarrhea, nausea and vomiting.  Genitourinary: Negative for difficulty urinating, dysuria, flank pain and hematuria.  Musculoskeletal: Negative for arthralgias, back pain, gait problem, joint swelling and myalgias.  Skin: Negative for pallor and wound.  Neurological: Negative for dizziness, tremors, weakness and light-headedness.  Hematological: Negative for adenopathy. Does not bruise/bleed easily.  Psychiatric/Behavioral: Negative for agitation, behavioral problems, confusion, decreased concentration, dysphoric mood and sleep disturbance. The patient is not hyperactive.        Objective:   Physical Exam  Constitutional: He is oriented to person, place, and time. He appears well-developed and well-nourished.  HENT:  Head: Normocephalic and atraumatic.  Eyes: Conjunctivae and EOM are normal. No scleral icterus.  Neck: Normal range of motion. Neck supple.  Cardiovascular: Normal rate and regular rhythm.  Pulmonary/Chest: Effort normal. No respiratory distress. He has no wheezes.  Abdominal: Soft. He exhibits no distension.  Musculoskeletal: Normal range of motion.        General: No tenderness or edema.  Neurological: He is alert and oriented to person, place, and time.  Skin: Skin is warm and dry. No pallor.  Psychiatric: He has a normal mood and affect. His behavior is normal. Judgment and thought content normal.  Nursing note and vitals reviewed.         Assessment & Plan:   HIV disease:  Continue Tivicay, Descovy, check labs today and return to clinic in 6 months   Smoking: Really needs to stop this to optimize the health of his baby  and also for his own health and to avoid novel coronavirus 2019  History of rashes: He would like treatment time Jeremy Harper refilled which have done for now.  Need for vaccinations I spent quite a bit of time counseling him on need for influenza vaccine for his own health for that of his wife and children for the community simile I also recommended strongly that he receive Prevnar 13 vaccine.  He is also overdue for a coccal vaccine.  I spent greater than 25 minutes with the patient including greater than 50% of time in face to face counsel of the patient ways to reduce risk of getting coverage COVID 2019, need for flu vaccination and pneumonia vaccination and adherence  to his antiretroviral as well as smoking cessation and in coordination of his care.

## 2019-05-26 ENCOUNTER — Ambulatory Visit: Payer: 59 | Admitting: Infectious Disease

## 2019-05-26 LAB — HELPER T-LYMPH-CD4 (ARMC ONLY)
% CD 4 Pos. Lymph.: 18.1 % — ABNORMAL LOW (ref 30.8–58.5)
Absolute CD 4 Helper: 489 /uL (ref 359–1519)
Basophils Absolute: 0.1 10*3/uL (ref 0.0–0.2)
Basos: 1 %
EOS (ABSOLUTE): 0.1 10*3/uL (ref 0.0–0.4)
Eos: 2 %
Hematocrit: 40.1 % (ref 37.5–51.0)
Hemoglobin: 13.3 g/dL (ref 13.0–17.7)
Immature Grans (Abs): 0 10*3/uL (ref 0.0–0.1)
Immature Granulocytes: 0 %
Lymphocytes Absolute: 2.7 10*3/uL (ref 0.7–3.1)
Lymphs: 42 %
MCH: 31.9 pg (ref 26.6–33.0)
MCHC: 33.2 g/dL (ref 31.5–35.7)
MCV: 96 fL (ref 79–97)
Monocytes Absolute: 0.3 10*3/uL (ref 0.1–0.9)
Monocytes: 5 %
Neutrophils Absolute: 3.2 10*3/uL (ref 1.4–7.0)
Neutrophils: 50 %
Platelets: 211 10*3/uL (ref 150–450)
RBC: 4.17 x10E6/uL (ref 4.14–5.80)
RDW: 13.4 % (ref 11.6–15.4)
WBC: 6.4 10*3/uL (ref 3.4–10.8)

## 2019-05-26 LAB — URINE CYTOLOGY ANCILLARY ONLY
Chlamydia: NEGATIVE
Neisseria Gonorrhea: NEGATIVE

## 2019-05-27 LAB — HIV-1 RNA QUANT-NO REFLEX-BLD
HIV 1 RNA Quant: <20 copies/mL — AB
HIV-1 RNA Quant, Log: <1.3 Log copies/mL — AB

## 2019-05-27 LAB — RPR TITER: RPR Titer: 1:2 {titer} — ABNORMAL HIGH

## 2019-05-27 LAB — COMPLETE METABOLIC PANEL WITH GFR
AG Ratio: 1.3 (calc) (ref 1.0–2.5)
ALT: 16 U/L (ref 9–46)
AST: 15 U/L (ref 10–40)
Albumin: 4 g/dL (ref 3.6–5.1)
Alkaline phosphatase (APISO): 88 U/L (ref 36–130)
BUN: 12 mg/dL (ref 7–25)
CO2: 25 mmol/L (ref 20–32)
Calcium: 9.1 mg/dL (ref 8.6–10.3)
Chloride: 108 mmol/L (ref 98–110)
Creat: 1.2 mg/dL (ref 0.60–1.35)
GFR, Est African American: 90 mL/min/{1.73_m2} (ref >=60)
GFR, Est Non African American: 78 mL/min/{1.73_m2} (ref >=60)
Globulin: 3.1 g/dL (calc) (ref 1.9–3.7)
Glucose, Bld: 124 mg/dL — ABNORMAL HIGH (ref 65–99)
Potassium: 3.9 mmol/L (ref 3.5–5.3)
Sodium: 139 mmol/L (ref 135–146)
Total Bilirubin: 0.3 mg/dL (ref 0.2–1.2)
Total Protein: 7.1 g/dL (ref 6.1–8.1)

## 2019-05-27 LAB — CBC WITH DIFFERENTIAL/PLATELET
Absolute Monocytes: 479 cells/uL (ref 200–950)
Basophils Absolute: 38 cells/uL (ref 0–200)
Basophils Relative: 0.6 %
Eosinophils Absolute: 183 cells/uL (ref 15–500)
Eosinophils Relative: 2.9 %
HCT: 38.4 % — ABNORMAL LOW (ref 38.5–50.0)
Hemoglobin: 13.2 g/dL (ref 13.2–17.1)
Lymphs Abs: 2514 cells/uL (ref 850–3900)
MCH: 31.5 pg (ref 27.0–33.0)
MCHC: 34.4 g/dL (ref 32.0–36.0)
MCV: 91.6 fL (ref 80.0–100.0)
MPV: 11.4 fL (ref 7.5–12.5)
Monocytes Relative: 7.6 %
Neutro Abs: 3087 cells/uL (ref 1500–7800)
Neutrophils Relative %: 49 %
Platelets: 222 10*3/uL (ref 140–400)
RBC: 4.19 10*6/uL — ABNORMAL LOW (ref 4.20–5.80)
RDW: 12.9 % (ref 11.0–15.0)
Total Lymphocyte: 39.9 %
WBC: 6.3 10*3/uL (ref 3.8–10.8)

## 2019-05-27 LAB — FLUORESCENT TREPONEMAL AB(FTA)-IGG-BLD: Fluorescent Treponemal ABS: NONREACTIVE

## 2019-05-27 LAB — RPR: RPR Ser Ql: REACTIVE — AB

## 2019-06-14 MED FILL — TIVICAY 50 MG TABLET: 50 | 30 days supply | Qty: 30 | Fill #0

## 2019-06-14 MED FILL — DESCOVY 200-25 MG TABS: 200-25 | 30 days supply | Qty: 30 | Fill #0

## 2019-07-09 MED FILL — TIVICAY 50 MG TABLET: 50 | 30 days supply | Qty: 30 | Fill #1

## 2019-07-09 MED FILL — DESCOVY 200-25 MG TABS: 200-25 | 30 days supply | Qty: 30 | Fill #1

## 2019-08-03 MED FILL — TIVICAY 50 MG TABLET: 50 | 30 days supply | Qty: 30 | Fill #2

## 2019-08-03 MED FILL — DESCOVY 200-25 MG TABS: 200-25 | 30 days supply | Qty: 30 | Fill #2

## 2019-08-31 MED FILL — TIVICAY 50 MG TABLET: 50 | 30 days supply | Qty: 30 | Fill #3

## 2019-08-31 MED FILL — DESCOVY 200-25 MG TABS: 200-25 | 30 days supply | Qty: 30 | Fill #3

## 2019-09-27 MED FILL — TIVICAY 50 MG TABLET: 50 | 30 days supply | Qty: 30 | Fill #4

## 2019-09-27 MED FILL — DESCOVY 200-25 MG TABS: 200-25 | 30 days supply | Qty: 30 | Fill #4

## 2019-10-22 ENCOUNTER — Inpatient Hospital Stay (HOSPITAL_COMMUNITY): Payer: 59 | Admitting: Anesthesiology

## 2019-10-22 ENCOUNTER — Inpatient Hospital Stay (HOSPITAL_COMMUNITY)
Admission: EM | Admit: 2019-10-22 | Discharge: 2019-10-26 | DRG: 330 | Disposition: A | Discharge status: Home / Self Care | Payer: 59 | Attending: Surgery | Admitting: Surgery

## 2019-10-22 ENCOUNTER — Inpatient Hospital Stay (HOSPITAL_COMMUNITY): Payer: 59

## 2019-10-22 ENCOUNTER — Encounter (HOSPITAL_COMMUNITY): Payer: Self-pay | Admitting: Emergency Medicine

## 2019-10-22 ENCOUNTER — Emergency Department (HOSPITAL_COMMUNITY): Payer: 59

## 2019-10-22 ENCOUNTER — Other Ambulatory Visit: Payer: Self-pay

## 2019-10-22 ENCOUNTER — Encounter (HOSPITAL_COMMUNITY): Admission: EM | Disposition: A | Discharge status: Home / Self Care | Payer: Self-pay | Source: Home / Self Care

## 2019-10-22 DIAGNOSIS — R109 Unspecified abdominal pain: Secondary | ICD-10-CM | POA: Diagnosis present

## 2019-10-22 DIAGNOSIS — S8991XA Unspecified injury of right lower leg, initial encounter: Secondary | ICD-10-CM | POA: Diagnosis not present

## 2019-10-22 DIAGNOSIS — K6389 Other specified diseases of intestine: Secondary | ICD-10-CM | POA: Diagnosis not present

## 2019-10-22 DIAGNOSIS — S36892A Contusion of other intra-abdominal organs, initial encounter: Secondary | ICD-10-CM | POA: Diagnosis present

## 2019-10-22 DIAGNOSIS — J9811 Atelectasis: Secondary | ICD-10-CM | POA: Diagnosis not present

## 2019-10-22 DIAGNOSIS — S3991XA Unspecified injury of abdomen, initial encounter: Secondary | ICD-10-CM

## 2019-10-22 DIAGNOSIS — Z79899 Other long term (current) drug therapy: Secondary | ICD-10-CM

## 2019-10-22 DIAGNOSIS — S0990XA Unspecified injury of head, initial encounter: Secondary | ICD-10-CM | POA: Diagnosis not present

## 2019-10-22 DIAGNOSIS — B20 Human immunodeficiency virus [HIV] disease: Secondary | ICD-10-CM | POA: Diagnosis present

## 2019-10-22 DIAGNOSIS — S36593A Other injury of sigmoid colon, initial encounter: Principal | ICD-10-CM | POA: Diagnosis present

## 2019-10-22 DIAGNOSIS — Y92411 Interstate highway as the place of occurrence of the external cause: Secondary | ICD-10-CM

## 2019-10-22 DIAGNOSIS — M89219 Other disorders of bone development and growth, unspecified shoulder: Secondary | ICD-10-CM | POA: Diagnosis not present

## 2019-10-22 DIAGNOSIS — J439 Emphysema, unspecified: Secondary | ICD-10-CM | POA: Diagnosis present

## 2019-10-22 DIAGNOSIS — S36523A Contusion of sigmoid colon, initial encounter: Secondary | ICD-10-CM | POA: Diagnosis not present

## 2019-10-22 DIAGNOSIS — Z20822 Contact with and (suspected) exposure to covid-19: Secondary | ICD-10-CM | POA: Diagnosis present

## 2019-10-22 DIAGNOSIS — J438 Other emphysema: Secondary | ICD-10-CM | POA: Diagnosis not present

## 2019-10-22 DIAGNOSIS — F17210 Nicotine dependence, cigarettes, uncomplicated: Secondary | ICD-10-CM | POA: Diagnosis present

## 2019-10-22 DIAGNOSIS — Z03818 Encounter for observation for suspected exposure to other biological agents ruled out: Secondary | ICD-10-CM | POA: Diagnosis not present

## 2019-10-22 DIAGNOSIS — S199XXA Unspecified injury of neck, initial encounter: Secondary | ICD-10-CM | POA: Diagnosis not present

## 2019-10-22 DIAGNOSIS — R188 Other ascites: Secondary | ICD-10-CM | POA: Diagnosis not present

## 2019-10-22 DIAGNOSIS — J449 Chronic obstructive pulmonary disease, unspecified: Secondary | ICD-10-CM | POA: Diagnosis not present

## 2019-10-22 DIAGNOSIS — S36528A Contusion of other part of colon, initial encounter: Secondary | ICD-10-CM | POA: Diagnosis not present

## 2019-10-22 DIAGNOSIS — R52 Pain, unspecified: Secondary | ICD-10-CM

## 2019-10-22 DIAGNOSIS — K567 Ileus, unspecified: Secondary | ICD-10-CM | POA: Diagnosis not present

## 2019-10-22 DIAGNOSIS — T148XXA Other injury of unspecified body region, initial encounter: Secondary | ICD-10-CM | POA: Diagnosis present

## 2019-10-22 DIAGNOSIS — M25561 Pain in right knee: Secondary | ICD-10-CM | POA: Diagnosis not present

## 2019-10-22 HISTORY — PX: LAPAROTOMY: SHX154

## 2019-10-22 HISTORY — PX: SMALL BOWEL REPAIR: SHX6447

## 2019-10-22 HISTORY — PX: LAPAROSCOPY: SHX197

## 2019-10-22 LAB — COMPREHENSIVE METABOLIC PANEL
ALT: 22 U/L (ref 0–44)
AST: 25 U/L (ref 15–41)
Albumin: 3.8 g/dL (ref 3.5–5.0)
Alkaline Phosphatase: 90 U/L (ref 38–126)
Anion gap: 7 (ref 5–15)
BUN: 11 mg/dL (ref 6–20)
CO2: 26 mmol/L (ref 22–32)
Calcium: 8.9 mg/dL (ref 8.9–10.3)
Chloride: 106 mmol/L (ref 98–111)
Creatinine, Ser: 1.22 mg/dL (ref 0.61–1.24)
GFR calc Af Amer: >60 mL/min (ref >60)
GFR calc non Af Amer: >60 mL/min (ref >60)
Glucose, Bld: 104 mg/dL — ABNORMAL HIGH (ref 70–99)
Potassium: 3.9 mmol/L (ref 3.5–5.1)
Sodium: 139 mmol/L (ref 135–145)
Total Bilirubin: 0.2 mg/dL — ABNORMAL LOW (ref 0.3–1.2)
Total Protein: 7.3 g/dL (ref 6.5–8.1)

## 2019-10-22 LAB — CBC WITH DIFFERENTIAL/PLATELET
Abs Immature Granulocytes: 0.01 10*3/uL (ref 0.00–0.07)
Basophils Absolute: 0 10*3/uL (ref 0.0–0.1)
Basophils Relative: 1 %
Eosinophils Absolute: 0.1 10*3/uL (ref 0.0–0.5)
Eosinophils Relative: 1 %
HCT: 41.9 % (ref 39.0–52.0)
Hemoglobin: 13.8 g/dL (ref 13.0–17.0)
Immature Granulocytes: 0 %
Lymphocytes Relative: 21 %
Lymphs Abs: 1.3 10*3/uL (ref 0.7–4.0)
MCH: 31 pg (ref 26.0–34.0)
MCHC: 32.9 g/dL (ref 30.0–36.0)
MCV: 94.2 fL (ref 80.0–100.0)
Monocytes Absolute: 0.4 10*3/uL (ref 0.1–1.0)
Monocytes Relative: 7 %
Neutro Abs: 4.3 10*3/uL (ref 1.7–7.7)
Neutrophils Relative %: 70 %
Platelets: 218 10*3/uL (ref 150–400)
RBC: 4.45 MIL/uL (ref 4.22–5.81)
RDW: 13.4 % (ref 11.5–15.5)
WBC: 6.2 10*3/uL (ref 4.0–10.5)
nRBC: 0 % (ref 0.0–0.2)

## 2019-10-22 LAB — I-STAT CHEM 8, ED
BUN: 12 mg/dL (ref 6–20)
Calcium, Ion: 1.14 mmol/L — ABNORMAL LOW (ref 1.15–1.40)
Chloride: 108 mmol/L (ref 98–111)
Creatinine, Ser: 1.1 mg/dL (ref 0.61–1.24)
Glucose, Bld: 100 mg/dL — ABNORMAL HIGH (ref 70–99)
HCT: 42 % (ref 39.0–52.0)
Hemoglobin: 14.3 g/dL (ref 13.0–17.0)
Potassium: 3.9 mmol/L (ref 3.5–5.1)
Sodium: 142 mmol/L (ref 135–145)
TCO2: 25 mmol/L (ref 22–32)

## 2019-10-22 LAB — RESPIRATORY PANEL BY RT PCR (FLU A&B, COVID)
Influenza A by PCR: NEGATIVE
Influenza B by PCR: NEGATIVE
SARS Coronavirus 2 by RT PCR: NEGATIVE

## 2019-10-22 LAB — ABO/RH: ABO/RH(D): AB POS

## 2019-10-22 LAB — TYPE AND SCREEN
ABO/RH(D): AB POS
Antibody Screen: NEGATIVE

## 2019-10-22 SURGERY — LAPAROSCOPY, DIAGNOSTIC
Anesthesia: General | Site: Abdomen

## 2019-10-22 MED ORDER — ONDANSETRON HCL 4 MG/2ML IJ SOLN
4.0000 mg | Freq: Four times a day (QID) | INTRAMUSCULAR | Status: DC | PRN
  Administered 2019-10-24 – 2019-10-25 (×3): 4 mg via INTRAVENOUS
  Filled 2019-10-22 (×3): qty 2

## 2019-10-22 MED ORDER — SUCCINYLCHOLINE CHLORIDE 200 MG/10ML IV SOSY
PREFILLED_SYRINGE | INTRAVENOUS | Status: AC
  Filled 2019-10-22: qty 10

## 2019-10-22 MED ORDER — PROPOFOL 10 MG/ML IV BOLUS
INTRAVENOUS | Status: DC | PRN
  Administered 2019-10-22: 150 mg via INTRAVENOUS

## 2019-10-22 MED ORDER — ONDANSETRON HCL 4 MG/2ML IJ SOLN
4.0000 mg | Freq: Four times a day (QID) | INTRAMUSCULAR | Status: DC | PRN
  Administered 2019-10-22: 09:00:00 4 mg via INTRAVENOUS
  Filled 2019-10-22: qty 2

## 2019-10-22 MED ORDER — SUCCINYLCHOLINE CHLORIDE 20 MG/ML IJ SOLN
INTRAMUSCULAR | Status: DC | PRN
  Administered 2019-10-22: 160 mg via INTRAVENOUS

## 2019-10-22 MED ORDER — LACTATED RINGERS IV SOLN: INTRAVENOUS | Status: DC

## 2019-10-22 MED ORDER — ACETAMINOPHEN 500 MG PO TABS
1000.0000 mg | ORAL_TABLET | Freq: Four times a day (QID) | ORAL | Status: DC
  Administered 2019-10-23 – 2019-10-25 (×8): 1000 mg via ORAL
  Filled 2019-10-22 (×10): qty 2

## 2019-10-22 MED ORDER — MORPHINE SULFATE (PF) 2 MG/ML IV SOLN
2.0000 mg | INTRAVENOUS | Status: DC | PRN
  Administered 2019-10-23: 4 mg via INTRAVENOUS
  Filled 2019-10-22: qty 2

## 2019-10-22 MED ORDER — SODIUM CHLORIDE 0.9 % IV SOLN
2.0000 g | Freq: Once | INTRAVENOUS | Status: AC
  Administered 2019-10-22: 2 g via INTRAVENOUS
  Filled 2019-10-22: qty 20

## 2019-10-22 MED ORDER — ONDANSETRON HCL 4 MG/2ML IJ SOLN
INTRAMUSCULAR | Status: AC
  Filled 2019-10-22: qty 2

## 2019-10-22 MED ORDER — DOLUTEGRAVIR SODIUM 50 MG PO TABS
50.0000 mg | ORAL_TABLET | Freq: Every evening | ORAL | Status: DC
  Administered 2019-10-23 – 2019-10-25 (×3): 50 mg via ORAL
  Filled 2019-10-22 (×5): qty 1

## 2019-10-22 MED ORDER — BUPIVACAINE HCL 0.25 % IJ SOLN
INTRAMUSCULAR | Status: DC | PRN
  Administered 2019-10-22: 4 mL

## 2019-10-22 MED ORDER — DEXMEDETOMIDINE HCL 200 MCG/2ML IV SOLN
INTRAVENOUS | Status: DC | PRN
  Administered 2019-10-22 (×2): 12 ug via INTRAVENOUS

## 2019-10-22 MED ORDER — OXYCODONE HCL 5 MG/5ML PO SOLN: 5.0000 mg | Freq: Once | ORAL | Status: DC | PRN

## 2019-10-22 MED ORDER — BUPIVACAINE HCL (PF) 0.25 % IJ SOLN
INTRAMUSCULAR | Status: AC
  Filled 2019-10-22: qty 30

## 2019-10-22 MED ORDER — LACTATED RINGERS IV SOLN: INTRAVENOUS | Status: DC | PRN

## 2019-10-22 MED ORDER — HYDROMORPHONE HCL 1 MG/ML IJ SOLN
0.2500 mg | INTRAMUSCULAR | Status: DC | PRN
  Administered 2019-10-22: 0.25 mg via INTRAVENOUS
  Administered 2019-10-22: 0.5 mg via INTRAVENOUS

## 2019-10-22 MED ORDER — OXYCODONE HCL 5 MG/5ML PO SOLN
5.0000 mg | ORAL | Status: DC | PRN
  Administered 2019-10-23: 09:00:00 10 mg
  Filled 2019-10-22: qty 10

## 2019-10-22 MED ORDER — DEXAMETHASONE SODIUM PHOSPHATE 10 MG/ML IJ SOLN
INTRAMUSCULAR | Status: AC
  Filled 2019-10-22: qty 1

## 2019-10-22 MED ORDER — LACTATED RINGERS IV BOLUS
1000.0000 mL | Freq: Once | INTRAVENOUS | Status: AC
  Administered 2019-10-22: 15:00:00 1000 mL via INTRAVENOUS

## 2019-10-22 MED ORDER — MORPHINE SULFATE (PF) 4 MG/ML IV SOLN
4.0000 mg | INTRAVENOUS | Status: DC | PRN
  Administered 2019-10-22: 4 mg via INTRAVENOUS
  Filled 2019-10-22: qty 1

## 2019-10-22 MED ORDER — METRONIDAZOLE IN NACL 5-0.79 MG/ML-% IV SOLN
500.0000 mg | Freq: Once | INTRAVENOUS | Status: AC
  Administered 2019-10-22: 500 mg via INTRAVENOUS
  Filled 2019-10-22: qty 100

## 2019-10-22 MED ORDER — DEXAMETHASONE SODIUM PHOSPHATE 10 MG/ML IJ SOLN
INTRAMUSCULAR | Status: DC | PRN
  Administered 2019-10-22: 10 mg via INTRAVENOUS

## 2019-10-22 MED ORDER — ROCURONIUM BROMIDE 10 MG/ML (PF) SYRINGE
PREFILLED_SYRINGE | INTRAVENOUS | Status: AC
  Filled 2019-10-22: qty 10

## 2019-10-22 MED ORDER — MIDAZOLAM HCL 2 MG/2ML IJ SOLN
INTRAMUSCULAR | Status: AC
  Filled 2019-10-22: qty 2

## 2019-10-22 MED ORDER — DOCUSATE SODIUM 100 MG PO CAPS
100.0000 mg | ORAL_CAPSULE | Freq: Two times a day (BID) | ORAL | Status: DC
  Administered 2019-10-23 – 2019-10-26 (×8): 100 mg via ORAL
  Filled 2019-10-22 (×8): qty 1

## 2019-10-22 MED ORDER — PROMETHAZINE HCL 25 MG/ML IJ SOLN: 6.2500 mg | INTRAMUSCULAR | Status: DC | PRN

## 2019-10-22 MED ORDER — HYDROMORPHONE HCL 1 MG/ML IJ SOLN
INTRAMUSCULAR | Status: AC
  Filled 2019-10-22: qty 1

## 2019-10-22 MED ORDER — OXYCODONE HCL 5 MG PO TABS: 5.0000 mg | ORAL_TABLET | Freq: Once | ORAL | Status: DC | PRN

## 2019-10-22 MED ORDER — FENTANYL CITRATE (PF) 250 MCG/5ML IJ SOLN
INTRAMUSCULAR | Status: DC | PRN
  Administered 2019-10-22: 50 ug via INTRAVENOUS
  Administered 2019-10-22: 100 ug via INTRAVENOUS

## 2019-10-22 MED ORDER — LIDOCAINE 2% (20 MG/ML) 5 ML SYRINGE
INTRAMUSCULAR | Status: DC | PRN
  Administered 2019-10-22: 100 mg via INTRAVENOUS

## 2019-10-22 MED ORDER — 0.9 % SODIUM CHLORIDE (POUR BTL) OPTIME
TOPICAL | Status: DC | PRN
  Administered 2019-10-22 (×2): 1000 mL

## 2019-10-22 MED ORDER — ROCURONIUM BROMIDE 100 MG/10ML IV SOLN
INTRAVENOUS | Status: DC | PRN
  Administered 2019-10-22: 50 mg via INTRAVENOUS

## 2019-10-22 MED ORDER — METHOCARBAMOL 1000 MG/10ML IJ SOLN
1000.0000 mg | Freq: Three times a day (TID) | INTRAVENOUS | Status: DC
  Administered 2019-10-22 – 2019-10-23 (×2): 1000 mg via INTRAVENOUS
  Filled 2019-10-22 (×5): qty 10

## 2019-10-22 MED ORDER — IPRATROPIUM-ALBUTEROL 0.5-2.5 (3) MG/3ML IN SOLN: 3.0000 mL | RESPIRATORY_TRACT | Status: DC | PRN

## 2019-10-22 MED ORDER — SUGAMMADEX SODIUM 200 MG/2ML IV SOLN
INTRAVENOUS | Status: DC | PRN
  Administered 2019-10-22: 200 mg via INTRAVENOUS

## 2019-10-22 MED ORDER — FENTANYL CITRATE (PF) 250 MCG/5ML IJ SOLN
INTRAMUSCULAR | Status: AC
  Filled 2019-10-22: qty 5

## 2019-10-22 MED ORDER — IOHEXOL 300 MG/ML  SOLN
100.0000 mL | Freq: Once | INTRAMUSCULAR | Status: AC | PRN
  Administered 2019-10-22: 10:00:00 100 mL via INTRAVENOUS

## 2019-10-22 MED ORDER — MIDAZOLAM HCL 5 MG/5ML IJ SOLN
INTRAMUSCULAR | Status: DC | PRN
  Administered 2019-10-22: 2 mg via INTRAVENOUS

## 2019-10-22 MED ORDER — ONDANSETRON HCL 4 MG/2ML IJ SOLN
INTRAMUSCULAR | Status: DC | PRN
  Administered 2019-10-22: 4 mg via INTRAVENOUS

## 2019-10-22 MED ORDER — ONDANSETRON 4 MG PO TBDP: 4.0000 mg | ORAL_TABLET | Freq: Four times a day (QID) | ORAL | Status: DC | PRN

## 2019-10-22 MED ORDER — PROPOFOL 10 MG/ML IV BOLUS
INTRAVENOUS | Status: AC
  Filled 2019-10-22: qty 20

## 2019-10-22 MED ORDER — EMTRICITABINE-TENOFOVIR AF 200-25 MG PO TABS
1.0000 | ORAL_TABLET | Freq: Every evening | ORAL | Status: DC
  Administered 2019-10-23 – 2019-10-25 (×3): 1 via ORAL
  Filled 2019-10-22 (×5): qty 1

## 2019-10-22 MED ORDER — MEPERIDINE HCL 25 MG/ML IJ SOLN: 6.2500 mg | INTRAMUSCULAR | Status: DC | PRN

## 2019-10-22 MED ORDER — ENOXAPARIN SODIUM 30 MG/0.3ML ~~LOC~~ SOLN
30.0000 mg | Freq: Two times a day (BID) | SUBCUTANEOUS | Status: DC
  Administered 2019-10-23 – 2019-10-26 (×7): 30 mg via SUBCUTANEOUS
  Filled 2019-10-22 (×7): qty 0.3

## 2019-10-22 SURGICAL SUPPLY — 48 items
BLADE CLIPPER SURG (BLADE) IMPLANT
CANISTER SUCT 3000ML PPV (MISCELLANEOUS) ×2 IMPLANT
CHLORAPREP W/TINT 26 (MISCELLANEOUS) ×2 IMPLANT
COVER SURGICAL LIGHT HANDLE (MISCELLANEOUS) ×2 IMPLANT
DERMABOND ADVANCED (GAUZE/BANDAGES/DRESSINGS) ×1
DERMABOND ADVANCED .7 DNX12 (GAUZE/BANDAGES/DRESSINGS) IMPLANT
DRAPE LAPAROSCOPIC ABDOMINAL (DRAPES) ×1 IMPLANT
DRAPE WARM FLUID 44X44 (DRAPES) ×2 IMPLANT
DRSG COVADERM PLUS 2X2 (GAUZE/BANDAGES/DRESSINGS) ×2 IMPLANT
DRSG OPSITE POSTOP 4X10 (GAUZE/BANDAGES/DRESSINGS) IMPLANT
DRSG OPSITE POSTOP 4X6 (GAUZE/BANDAGES/DRESSINGS) ×1 IMPLANT
DRSG OPSITE POSTOP 4X8 (GAUZE/BANDAGES/DRESSINGS) IMPLANT
ELECT BLADE 6.5 EXT (BLADE) IMPLANT
ELECT CAUTERY BLADE 6.4 (BLADE) ×2 IMPLANT
ELECT REM PT RETURN 9FT ADLT (ELECTROSURGICAL) ×2
ELECTRODE REM PT RTRN 9FT ADLT (ELECTROSURGICAL) ×1 IMPLANT
GLOVE BIO SURGEON STRL SZ 6.5 (GLOVE) ×3 IMPLANT
GLOVE BIOGEL PI IND STRL 6 (GLOVE) ×1 IMPLANT
GLOVE BIOGEL PI INDICATOR 6 (GLOVE)
GOWN STRL REUS W/ TWL LRG LVL3 (GOWN DISPOSABLE) ×2 IMPLANT
GOWN STRL REUS W/TWL LRG LVL3 (GOWN DISPOSABLE) ×2
HANDLE SUCTION POOLE (INSTRUMENTS) ×1 IMPLANT
KIT BASIN OR (CUSTOM PROCEDURE TRAY) ×2 IMPLANT
KIT TURNOVER KIT B (KITS) ×2 IMPLANT
LIGASURE IMPACT 36 18CM CVD LR (INSTRUMENTS) IMPLANT
NS IRRIG 1000ML POUR BTL (IV SOLUTION) ×4 IMPLANT
PAD ARMBOARD 7.5X6 YLW CONV (MISCELLANEOUS) ×2 IMPLANT
PENCIL SMOKE EVACUATOR (MISCELLANEOUS) ×2 IMPLANT
SLEEVE ENDOPATH XCEL 5M (ENDOMECHANICALS) ×1 IMPLANT
SPECIMEN JAR LARGE (MISCELLANEOUS) IMPLANT
SPONGE LAP 18X18 RF (DISPOSABLE) ×1 IMPLANT
STAPLER VISISTAT 35W (STAPLE) ×2 IMPLANT
SUCTION POOLE HANDLE (INSTRUMENTS) ×2
SUT MNCRL AB 4-0 PS2 18 (SUTURE) ×1 IMPLANT
SUT PDS AB 1 TP1 54 (SUTURE) IMPLANT
SUT PDS AB 1 TP1 96 (SUTURE) ×1 IMPLANT
SUT SILK 2 0 SH CR/8 (SUTURE) ×2 IMPLANT
SUT SILK 2 0 TIES 10X30 (SUTURE) ×2 IMPLANT
SUT SILK 3 0 SH CR/8 (SUTURE) ×2 IMPLANT
SUT SILK 3 0 TIES 10X30 (SUTURE) ×2 IMPLANT
SUT VIC AB 3-0 SH 18 (SUTURE) ×1 IMPLANT
TOWEL GREEN STERILE (TOWEL DISPOSABLE) ×2 IMPLANT
TRAY FOLEY MTR SLVR 16FR STAT (SET/KITS/TRAYS/PACK) ×1 IMPLANT
TRAY LAPAROSCOPIC MC (CUSTOM PROCEDURE TRAY) ×1 IMPLANT
TROCAR XCEL BLUNT TIP 100MML (ENDOMECHANICALS) ×1 IMPLANT
TROCAR XCEL NON-BLD 5MMX100MML (ENDOMECHANICALS) ×1 IMPLANT
TUBING LAP HI FLOW INSUFFLATIO (TUBING) ×1 IMPLANT
YANKAUER SUCT BULB TIP NO VENT (SUCTIONS) ×1 IMPLANT
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
## 2019-10-22 NOTE — Anesthesia Procedure Notes (Addendum)
Procedure Name: Intubation Date/Time: 10/22/2019 1:51 PM Performed by: Janene Harvey, CRNA Pre-anesthesia Checklist: Patient identified, Emergency Drugs available, Suction available and Patient being monitored Patient Re-evaluated:Patient Re-evaluated prior to induction Oxygen Delivery Method: Circle system utilized Preoxygenation: Pre-oxygenation with 100% oxygen Induction Type: Rapid sequence, Cricoid Pressure applied and IV induction Laryngoscope Size: Mac and 4 Grade View: Grade I Tube type: Oral Tube size: 7.5 mm Number of attempts: 1 Airway Equipment and Method: Stylet and Oral airway Placement Confirmation: ETT inserted through vocal cords under direct vision,  positive ETCO2 and breath sounds checked- equal and bilateral Secured at: 22 cm Tube secured with: Tape Dental Injury: Teeth and Oropharynx as per pre-operative assessment

## 2019-10-22 NOTE — Anesthesia Preprocedure Evaluation (Signed)
Anesthesia Evaluation  Patient identified by MRN, date of birth, ID band Patient awake    Reviewed: Allergy & Precautions, NPO status , Patient's Chart, lab work & pertinent test results  Airway Mallampati: II  TM Distance: >3 FB Neck ROM: Full    Dental  (+) Dental Advisory Given   Pulmonary COPD, Current Smoker,    Pulmonary exam normal breath sounds clear to auscultation       Cardiovascular negative cardio ROS Normal cardiovascular exam Rhythm:Regular Rate:Normal     Neuro/Psych negative neurological ROS  negative psych ROS   GI/Hepatic negative GI ROS, Neg liver ROS,   Endo/Other  negative endocrine ROS  Renal/GU negative Renal ROS     Musculoskeletal negative musculoskeletal ROS (+)   Abdominal   Peds  Hematology negative hematology ROS (+)   Anesthesia Other Findings   Reproductive/Obstetrics                             Anesthesia Physical Anesthesia Plan  ASA: IV and emergent  Anesthesia Plan: General   Post-op Pain Management:    Induction: Intravenous, Rapid sequence and Cricoid pressure planned  PONV Risk Score and Plan: 3 and Ondansetron, Dexamethasone, Treatment may vary due to age or medical condition and Midazolam  Airway Management Planned: Oral ETT  Additional Equipment:   Intra-op Plan:   Post-operative Plan: Extubation in OR  Informed Consent: I have reviewed the patients History and Physical, chart, labs and discussed the procedure including the risks, benefits and alternatives for the proposed anesthesia with the patient or authorized representative who has indicated his/her understanding and acceptance.     Dental advisory given  Plan Discussed with: CRNA  Anesthesia Plan Comments:         Anesthesia Quick Evaluation
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
## 2019-10-22 NOTE — Plan of Care (Signed)

## 2019-10-22 NOTE — Transfer of Care (Signed)
Immediate Anesthesia Transfer of Care Note  Patient: Jeremy Harper  Procedure(s) Performed: LAPAROSCOPY DIAGNOSTIC (N/A Abdomen) EXPLORATORY LAPAROTOMY (N/A Abdomen) Small Bowel Repair (N/A Abdomen)  Patient Location: PACU  Anesthesia Type:General  Level of Consciousness: awake, alert  and oriented  Airway & Oxygen Therapy: Patient Spontanous Breathing and Patient connected to nasal cannula oxygen  Post-op Assessment: Report given to RN, Post -op Vital signs reviewed and stable and Patient moving all extremities X 4  Post vital signs: Reviewed and stable  Last Vitals:  Vitals Value Taken Time  BP 153/90 10/22/19 1715  Temp 36.5 C 10/22/19 1715  Pulse 75 10/22/19 1717  Resp 23 10/22/19 1717  SpO2 100 % 10/22/19 1717  Vitals shown include unvalidated device data.  Last Pain:  Vitals:   10/22/19 1715  TempSrc:   PainSc: Asleep         Complications: No apparent anesthesia complications

## 2019-10-23 LAB — CBC
HCT: 39.1 % (ref 39.0–52.0)
Hemoglobin: 13.6 g/dL (ref 13.0–17.0)
MCH: 31.9 pg (ref 26.0–34.0)
MCHC: 34.8 g/dL (ref 30.0–36.0)
MCV: 91.8 fL (ref 80.0–100.0)
Platelets: 224 10*3/uL (ref 150–400)
RBC: 4.26 MIL/uL (ref 4.22–5.81)
RDW: 13.2 % (ref 11.5–15.5)
WBC: 11.9 10*3/uL — ABNORMAL HIGH (ref 4.0–10.5)
nRBC: 0 % (ref 0.0–0.2)

## 2019-10-23 LAB — BASIC METABOLIC PANEL
Anion gap: 8 (ref 5–15)
BUN: 8 mg/dL (ref 6–20)
CO2: 22 mmol/L (ref 22–32)
Calcium: 8.8 mg/dL — ABNORMAL LOW (ref 8.9–10.3)
Chloride: 105 mmol/L (ref 98–111)
Creatinine, Ser: 1.07 mg/dL (ref 0.61–1.24)
GFR calc Af Amer: >60 mL/min (ref >60)
GFR calc non Af Amer: >60 mL/min (ref >60)
Glucose, Bld: 112 mg/dL — ABNORMAL HIGH (ref 70–99)
Potassium: 4.1 mmol/L (ref 3.5–5.1)
Sodium: 135 mmol/L (ref 135–145)

## 2019-10-23 LAB — PHOSPHORUS: Phosphorus: 3.8 mg/dL (ref 2.5–4.6)

## 2019-10-23 LAB — MAGNESIUM: Magnesium: 1.5 mg/dL — ABNORMAL LOW (ref 1.7–2.4)

## 2019-10-23 MED ORDER — METHOCARBAMOL 500 MG PO TABS
1000.0000 mg | ORAL_TABLET | Freq: Three times a day (TID) | ORAL | Status: DC
  Administered 2019-10-23 – 2019-10-24 (×3): 1000 mg via ORAL
  Filled 2019-10-23 (×3): qty 2

## 2019-10-23 MED ORDER — MAGNESIUM SULFATE 2 GM/50ML IV SOLN
2.0000 g | Freq: Once | INTRAVENOUS | Status: AC
  Administered 2019-10-23: 2 g via INTRAVENOUS
  Filled 2019-10-23: qty 50

## 2019-10-23 MED ORDER — TRAMADOL HCL 50 MG PO TABS: 50.0000 mg | ORAL_TABLET | Freq: Four times a day (QID) | ORAL | Status: DC | PRN

## 2019-10-23 MED ORDER — OXYCODONE HCL 5 MG PO TABS: 5.0000 mg | ORAL_TABLET | ORAL | Status: DC | PRN

## 2019-10-23 MED ORDER — IBUPROFEN 200 MG PO TABS: 600.0000 mg | ORAL_TABLET | Freq: Four times a day (QID) | ORAL | Status: DC | PRN

## 2019-10-23 MED ORDER — HYDROMORPHONE HCL 1 MG/ML IJ SOLN: 0.5000 mg | INTRAMUSCULAR | Status: DC | PRN
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
## 2019-10-23 NOTE — Progress Notes (Signed)
Subjective No acute events. Feeling well. Denies flatus/bm yet. Denies n/v. Pain well controlled.  Objective: Vital signs in last 24 hours: Temp:  [97.7 F (36.5 C)-99.2 F (37.3 C)] 98.5 F (36.9 C) (02/13 0800) Pulse Rate:  [43-82] 56 (02/13 0800) Resp:  [16-26] 16 (02/13 0800) BP: (123-153)/(78-92) 145/91 (02/13 0800) SpO2:  [94 %-100 %] 99 % (02/13 0800) Last BM Date: (PTA)  Intake/Output from previous day: 02/12 0701 - 02/13 0700 In: 2164.4 [I.V.:1864.4; IV Piggyback:300] Out: 510 [Urine:460; Blood:50] Intake/Output this shift: Total I/O In: 310.1 [P.O.:120; I.V.:190.1] Out: -   Gen: NAD, comfortable, working on cell phone CV: RRR Pulm: Normal work of breathing Abd: Soft, NT/ND; incision c/d/i with honeycomb dressing in place Ext: SCDs in place  Lab Results: CBC  Recent Labs    10/22/19 0845 10/22/19 0845 10/22/19 0855 10/23/19 0510  WBC 6.2  --   --  11.9*  HGB 13.8   < > 14.3 13.6  HCT 41.9   < > 42.0 39.1  PLT 218  --   --  224   < > = values in this interval not displayed.   BMET Recent Labs    10/22/19 0845 10/22/19 0845 10/22/19 0855 10/23/19 0510  NA 139   < > 142 135  K 3.9   < > 3.9 4.1  CL 106   < > 108 105  CO2 26  --   --  22  GLUCOSE 104*   < > 100* 112*  BUN 11   < > 12 8  CREATININE 1.22   < > 1.10 1.07  CALCIUM 8.9  --   --  8.8*   < > = values in this interval not displayed.   PT/INR No results for input(s): LABPROT, INR in the last 72 hours. ABG No results for input(s): PHART, HCO3 in the last 72 hours.  Invalid input(s): PCO2, PO2  Studies/Results:  Anti-infectives: Anti-infectives (From admission, onward)   Start     Dose/Rate Route Frequency Ordered Stop   10/22/19 1800  dolutegravir (TIVICAY) tablet 50 mg     50 mg Oral Every evening 10/22/19 1300     10/22/19 1800  emtricitabine-tenofovir AF (DESCOVY) 200-25 MG per tablet 1 tablet     1 tablet Oral Every evening 10/22/19 1300     10/22/19 1315  metroNIDAZOLE  (FLAGYL) IVPB 500 mg     500 mg 100 mL/hr over 60 Minutes Intravenous  Once 10/22/19 1300 10/22/19 1555   10/22/19 1315  cefTRIAXone (ROCEPHIN) 2 g in sodium chloride 0.9 % 100 mL IVPB     2 g 200 mL/hr over 30 Minutes Intravenous  Once 10/22/19 1300 10/22/19 1441       Assessment/Plan: Patient Active Problem List   Diagnosis Date Noted  . Contusion 10/22/2019  . Zoster 12/28/2018  . Emphysema of lung (HCC) 01/02/2017  . Smoker 01/02/2017  . Rash and nonspecific skin eruption 12/07/2015  . HIV disease (HCC) 03/06/2010   s/p Procedure(s): LAPAROSCOPY DIAGNOSTIC EXPLORATORY LAPAROTOMY Small Bowel Repair 10/22/2019  -Recovering well; we discussed his operative findings and what was done -Adv to soft diet -Ambulate 5x/day -Sched tyl, added ibuprofen 600 q6 prn, tramadol q6 prn, dilaudid q3 prn -PPx: Lov, SCDs   LOS: 1 day   Stephanie Coup. Cliffton Asters, M.D. [facility] Use [email or web address] to contact on call provider
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
## 2019-10-23 NOTE — Anesthesia Postprocedure Evaluation (Signed)
Anesthesia Post Note  Patient: Jeremy Harper  Procedure(s) Performed: LAPAROSCOPY DIAGNOSTIC (N/A Abdomen) EXPLORATORY LAPAROTOMY (N/A Abdomen) Small Bowel Repair (N/A Abdomen)     Patient location during evaluation: PACU Anesthesia Type: General Level of consciousness: sedated and patient cooperative Pain management: pain level controlled Vital Signs Assessment: post-procedure vital signs reviewed and stable Respiratory status: spontaneous breathing Cardiovascular status: stable Anesthetic complications: no    Last Vitals:  Vitals:   10/23/19 0600 10/23/19 0800  BP: 130/85 (!) 145/91  Pulse: 65 (!) 56  Resp: 19 16  Temp:  36.9 C  SpO2: 96% 99%    Last Pain:  Vitals:   10/23/19 0800  TempSrc: Oral  PainSc: 8                  Lewie Loron

## 2019-10-24 LAB — BASIC METABOLIC PANEL
Anion gap: 11 (ref 5–15)
BUN: 11 mg/dL (ref 6–20)
CO2: 23 mmol/L (ref 22–32)
Calcium: 8.9 mg/dL (ref 8.9–10.3)
Chloride: 103 mmol/L (ref 98–111)
Creatinine, Ser: 1.2 mg/dL (ref 0.61–1.24)
GFR calc Af Amer: >60 mL/min (ref >60)
GFR calc non Af Amer: >60 mL/min (ref >60)
Glucose, Bld: 101 mg/dL — ABNORMAL HIGH (ref 70–99)
Potassium: 3.7 mmol/L (ref 3.5–5.1)
Sodium: 137 mmol/L (ref 135–145)

## 2019-10-24 LAB — CBC
HCT: 41.9 % (ref 39.0–52.0)
Hemoglobin: 14.1 g/dL (ref 13.0–17.0)
MCH: 30.9 pg (ref 26.0–34.0)
MCHC: 33.7 g/dL (ref 30.0–36.0)
MCV: 91.9 fL (ref 80.0–100.0)
Platelets: 229 10*3/uL (ref 150–400)
RBC: 4.56 MIL/uL (ref 4.22–5.81)
RDW: 13.3 % (ref 11.5–15.5)
WBC: 7.6 10*3/uL (ref 4.0–10.5)
nRBC: 0 % (ref 0.0–0.2)

## 2019-10-24 LAB — PHOSPHORUS: Phosphorus: 2.3 mg/dL — ABNORMAL LOW (ref 2.5–4.6)

## 2019-10-24 LAB — MAGNESIUM: Magnesium: 1.8 mg/dL (ref 1.7–2.4)

## 2019-10-24 MED ORDER — POTASSIUM CHLORIDE 20 MEQ/15ML (10%) PO SOLN: 40.0000 meq | Freq: Once | ORAL | Status: DC

## 2019-10-24 MED ORDER — METHOCARBAMOL 1000 MG/10ML IJ SOLN
1000.0000 mg | Freq: Three times a day (TID) | INTRAVENOUS | Status: DC
  Administered 2019-10-24 – 2019-10-26 (×6): 1000 mg via INTRAVENOUS
  Filled 2019-10-24 (×2): qty 10
  Filled 2019-10-24: qty 1000
  Filled 2019-10-24 (×6): qty 10

## 2019-10-24 MED ORDER — MAGNESIUM SULFATE 2 GM/50ML IV SOLN
2.0000 g | Freq: Once | INTRAVENOUS | Status: AC
  Administered 2019-10-24: 08:00:00 2 g via INTRAVENOUS
  Filled 2019-10-24: qty 50

## 2019-10-24 MED ORDER — POTASSIUM PHOSPHATES 15 MMOLE/5ML IV SOLN
30.0000 mmol | Freq: Once | INTRAVENOUS | Status: AC
  Administered 2019-10-24: 30 mmol via INTRAVENOUS
  Filled 2019-10-24: qty 10
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
## 2019-10-24 NOTE — Progress Notes (Signed)
CSW met with patient to engage in SBIRT per trauma protocol. Patient scored a total of 2 on their SBIRT exam. CSW provided psychoeducation on substance use and offered patient with resources as appropriate. CSW provided brief intervention to patient discussing patient's report of intermittent monthly use with no obvious patterns. Patient reported no known/significant history of alcohol/substance use Patient responded appropriately to discussions of substance use.   Daphine Deutscher, LCSW, [place] Social Worker II Emergency Department, [place], [place] [phone]
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
## 2019-10-25 LAB — CBC
HCT: 42.7 % (ref 39.0–52.0)
Hemoglobin: 14.8 g/dL (ref 13.0–17.0)
MCH: 31.3 pg (ref 26.0–34.0)
MCHC: 34.7 g/dL (ref 30.0–36.0)
MCV: 90.3 fL (ref 80.0–100.0)
Platelets: 235 K/uL (ref 150–400)
RBC: 4.73 MIL/uL (ref 4.22–5.81)
RDW: 13.1 % (ref 11.5–15.5)
WBC: 7.6 K/uL (ref 4.0–10.5)
nRBC: 0 % (ref 0.0–0.2)

## 2019-10-25 LAB — BASIC METABOLIC PANEL
Anion gap: 9 (ref 5–15)
BUN: 13 mg/dL (ref 6–20)
CO2: 24 mmol/L (ref 22–32)
Calcium: 9.2 mg/dL (ref 8.9–10.3)
Chloride: 102 mmol/L (ref 98–111)
Creatinine, Ser: 1.05 mg/dL (ref 0.61–1.24)
GFR calc Af Amer: >60 mL/min (ref >60)
GFR calc non Af Amer: >60 mL/min (ref >60)
Glucose, Bld: 112 mg/dL — ABNORMAL HIGH (ref 70–99)
Potassium: 4.2 mmol/L (ref 3.5–5.1)
Sodium: 135 mmol/L (ref 135–145)

## 2019-10-25 LAB — T-HELPER CELLS (CD4) COUNT (NOT AT ARMC)
CD4 % Helper T Cell: 17 % — ABNORMAL LOW (ref 33–65)
CD4 T Cell Abs: 268 /uL — ABNORMAL LOW (ref 400–1790)

## 2019-10-25 LAB — MAGNESIUM: Magnesium: 1.9 mg/dL (ref 1.7–2.4)

## 2019-10-25 LAB — PHOSPHORUS: Phosphorus: 3.2 mg/dL (ref 2.5–4.6)

## 2019-10-25 NOTE — Progress Notes (Signed)
PA went into room and told patient he didn't need fluids running but did not put in an order to stop.  I told patient he could refuse the fluids or I need to hook it back up.  He said he was going to talk to wife and let me know.  I told RN coming on the night shift. PA will return in the AM
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
## 2019-10-26 MED ORDER — METHOCARBAMOL 500 MG PO TABS: 1000.0000 mg | ORAL_TABLET | Freq: Three times a day (TID) | ORAL | 0 refills | Status: DC | PRN

## 2019-10-26 MED ORDER — DOCUSATE SODIUM 100 MG PO CAPS: 100.0000 mg | ORAL_CAPSULE | Freq: Every day | ORAL | Status: DC | PRN

## 2019-10-26 MED ORDER — IBUPROFEN 200 MG PO TABS: 600.0000 mg | ORAL_TABLET | Freq: Four times a day (QID) | ORAL | Status: DC | PRN

## 2019-10-26 MED ORDER — OXYCODONE HCL 5 MG PO TABS: 5.0000 mg | ORAL_TABLET | Freq: Four times a day (QID) | ORAL | 0 refills | Status: DC | PRN

## 2019-10-26 MED ORDER — ACETAMINOPHEN 500 MG PO TABS: 1000.0000 mg | ORAL_TABLET | Freq: Four times a day (QID) | ORAL | Status: AC | PRN

## 2019-10-26 NOTE — Plan of Care (Signed)
Problem: Education: Goal: Knowledge of General Education information will improve Description: Including pain rating scale, medication(s)/side effects and non-pharmacologic comfort measures 10/26/2019 1027 by Lawanda Cousins, RN Outcome: Progressing 10/26/2019 1027 by Lawanda Cousins, RN Outcome: Progressing   Problem: Health Behavior/Discharge Planning: Goal: Ability to manage health-related needs will improve 10/26/2019 1027 by Lawanda Cousins, RN Outcome: Progressing 10/26/2019 1027 by Lawanda Cousins, RN Outcome: Progressing   Problem: Clinical Measurements: Goal: Ability to maintain clinical measurements within normal limits will improve 10/26/2019 1027 by Lawanda Cousins, RN Outcome: Progressing 10/26/2019 1027 by Lawanda Cousins, RN Outcome: Progressing Goal: Will remain free from infection 10/26/2019 1027 by Lawanda Cousins, RN Outcome: Progressing 10/26/2019 1027 by Lawanda Cousins, RN Outcome: Progressing Goal: Diagnostic test results will improve 10/26/2019 1027 by Lawanda Cousins, RN Outcome: Progressing 10/26/2019 1027 by Lawanda Cousins, RN Outcome: Progressing Goal: Respiratory complications will improve 10/26/2019 1027 by Lawanda Cousins, RN Outcome: Progressing 10/26/2019 1027 by Lawanda Cousins, RN Outcome: Progressing Goal: Cardiovascular complication will be avoided 10/26/2019 1027 by Lawanda Cousins, RN Outcome: Progressing 10/26/2019 1027 by Lawanda Cousins, RN Outcome: Progressing   Problem: Activity: Goal: Risk for activity intolerance will decrease 10/26/2019 1027 by Lawanda Cousins, RN Outcome: Progressing 10/26/2019 1027 by Lawanda Cousins, RN Outcome: Progressing   Problem: Nutrition: Goal: Adequate nutrition will be maintained 10/26/2019 1027 by Lawanda Cousins, RN Outcome: Progressing 10/26/2019 1027 by Lawanda Cousins, RN Outcome: Progressing   Problem: Coping: Goal: Level of anxiety will decrease 10/26/2019 1027 by Lawanda Cousins, RN Outcome:  Progressing 10/26/2019 1027 by Lawanda Cousins, RN Outcome: Progressing   Problem: Elimination: Goal: Will not experience complications related to bowel motility 10/26/2019 1027 by Lawanda Cousins, RN Outcome: Progressing 10/26/2019 1027 by Lawanda Cousins, RN Outcome: Progressing Goal: Will not experience complications related to urinary retention 10/26/2019 1027 by Lawanda Cousins, RN Outcome: Progressing 10/26/2019 1027 by Lawanda Cousins, RN Outcome: Progressing   Problem: Pain Managment: Goal: General experience of comfort will improve 10/26/2019 1027 by Lawanda Cousins, RN Outcome: Progressing 10/26/2019 1027 by Lawanda Cousins, RN Outcome: Progressing   Problem: Safety: Goal: Ability to remain free from injury will improve 10/26/2019 1027 by Lawanda Cousins, RN Outcome: Progressing 10/26/2019 1027 by Lawanda Cousins, RN Outcome: Progressing   Problem: Skin Integrity: Goal: Risk for impaired skin integrity will decrease 10/26/2019 1027 by Lawanda Cousins, RN Outcome: Progressing 10/26/2019 1027 by Lawanda Cousins, RN Outcome: Progressing   Problem: Education: Goal: Knowledge of General Education information will improve Description: Including pain rating scale, medication(s)/side effects and non-pharmacologic comfort measures 10/26/2019 1027 by Lawanda Cousins, RN Outcome: Progressing 10/26/2019 1027 by Lawanda Cousins, RN Outcome: Progressing   Problem: Health Behavior/Discharge Planning: Goal: Ability to manage health-related needs will improve 10/26/2019 1027 by Lawanda Cousins, RN Outcome: Progressing 10/26/2019 1027 by Lawanda Cousins, RN Outcome: Progressing   Problem: Clinical Measurements: Goal: Ability to maintain clinical measurements within normal limits will improve 10/26/2019 1027 by Lawanda Cousins, RN Outcome: Progressing 10/26/2019 1027 by Lawanda Cousins, RN Outcome: Progressing Goal: Will remain free from infection 10/26/2019 1027 by Lawanda Cousins, RN Outcome:  Progressing 10/26/2019 1027 by Lawanda Cousins, RN Outcome: Progressing Goal: Diagnostic test results will improve 10/26/2019 1027 by Lawanda Cousins, RN Outcome: Progressing 10/26/2019 1027 by Lawanda Cousins, RN Outcome: Progressing Goal: Respiratory complications will improve 10/26/2019 1027 by  Elba Barman, RN Outcome: Progressing 10/26/2019 1027 by Elba Barman, RN Outcome: Progressing Goal: Cardiovascular complication will be avoided 10/26/2019 1027 by Elba Barman, RN Outcome: Progressing 10/26/2019 1027 by Elba Barman, RN Outcome: Progressing   Problem: Activity: Goal: Risk for activity intolerance will decrease 10/26/2019 1027 by Elba Barman, RN Outcome: Progressing 10/26/2019 1027 by Elba Barman, RN Outcome: Progressing   Problem: Nutrition: Goal: Adequate nutrition will be maintained 10/26/2019 1027 by Elba Barman, RN Outcome: Progressing 10/26/2019 1027 by Elba Barman, RN Outcome: Progressing   Problem: Coping: Goal: Level of anxiety will decrease 10/26/2019 1027 by Elba Barman, RN Outcome: Progressing 10/26/2019 1027 by Elba Barman, RN Outcome: Progressing   Problem: Elimination: Goal: Will not experience complications related to bowel motility 10/26/2019 1027 by Elba Barman, RN Outcome: Progressing 10/26/2019 1027 by Elba Barman, RN Outcome: Progressing Goal: Will not experience complications related to urinary retention 10/26/2019 1027 by Elba Barman, RN Outcome: Progressing 10/26/2019 1027 by Elba Barman, RN Outcome: Progressing   Problem: Pain Managment: Goal: General experience of comfort will improve 10/26/2019 1027 by Elba Barman, RN Outcome: Progressing 10/26/2019 1027 by Elba Barman, RN Outcome: Progressing   Problem: Safety: Goal: Ability to remain free from injury will improve 10/26/2019 1027 by Elba Barman, RN Outcome: Progressing 10/26/2019 1027 by Elba Barman, RN Outcome: Progressing   Problem:  Skin Integrity: Goal: Risk for impaired skin integrity will decrease 10/26/2019 1027 by Elba Barman, RN Outcome: Progressing 10/26/2019 1027 by Elba Barman, RN Outcome: Progressing

## 2019-10-26 NOTE — Discharge Instructions (Signed)
Remove staples on POD#14 (11/05/19) - call [facility] office if any concerns about incision or otherwise. Follow up appointment scheduled for 11/18/19  [facility], [place] [phone]  OPEN ABDOMINAL SURGERY: POST OP INSTRUCTIONS  Always review your discharge instruction sheet given to you by the facility where your surgery was performed.  IF YOU HAVE DISABILITY OR FAMILY LEAVE FORMS, YOU MUST BRING THEM TO THE OFFICE FOR PROCESSING.  PLEASE DO NOT GIVE THEM TO YOUR DOCTOR.  1. A prescription for pain medication may be given to you upon discharge.  Take your pain medication as prescribed, if needed.  If narcotic pain medicine is not needed, then you may take acetaminophen (Tylenol) or ibuprofen (Advil) as needed. 2. Take your usually prescribed medications unless otherwise directed. 3. If you need a refill on your pain medication, please contact your pharmacy. They will contact our office to request authorization.  Prescriptions will not be filled after 5pm or on week-ends. 4. You should follow a light diet the first few days after arrival home, such as soup and crackers, pudding, etc.unless your doctor has advised otherwise. A high-fiber, low fat diet can be resumed as tolerated.   Be sure to include lots of fluids daily. Most patients will experience some swelling and bruising on the chest and neck area.  Ice packs will help.  Swelling and bruising can take several days to resolve 5. Most patients will experience some swelling and bruising in the area of the incision. Ice pack will help. Swelling and bruising can take several days to resolve..  6. It is common to experience some constipation if taking pain medication after surgery.  Increasing fluid intake and taking a stool softener will usually help or prevent this problem from occurring.  A mild laxative (Milk of Magnesia or Miralax) should be taken according to package directions if there are no bowel movements after 48 hours. 7.   You may have steri-strips (small skin tapes) in place directly over the incision.  These strips should be left on the skin for 7-10 days.  If your surgeon used skin glue on the incision, you may shower in 24 hours.  The glue will flake off over the next 2-3 weeks.  Any sutures or staples will be removed at the office during your follow-up visit. You may find that a light gauze bandage over your incision may keep your staples from being rubbed or pulled. You may shower and replace the bandage daily. 8. ACTIVITIES:  You may resume regular (light) daily activities beginning the next day--such as daily self-care, walking, climbing stairs--gradually increasing activities as tolerated.  You may have sexual intercourse when it is comfortable.  Refrain from any heavy lifting or straining until approved by your doctor. a. You may drive when you no longer are taking prescription pain medication, you can comfortably wear a seatbelt, and you can safely maneuver your car and apply brakes  9. You should see your doctor in the office for a follow-up appointment approximately two weeks after your surgery.  Make sure that you call for this appointment within a day or two after you arrive home to insure a convenient appointment time.  WHEN TO CALL YOUR DOCTOR: 1. Fever over 101.0 2. Inability to urinate 3. Nausea and/or vomiting 4. Extreme swelling or bruising 5. Continued bleeding from incision. 6. Increased pain, redness, or drainage from the incision. 7. Difficulty swallowing or breathing 8. Muscle cramping or spasms. 9. Numbness or tingling  in hands or feet or around lips.  The clinic staff is available to answer your questions during regular business hours.  Please don't hesitate to call and ask to speak to one of the nurses if you have concerns.  For further questions, please visit [email or web address]

## 2019-10-26 NOTE — Consult Note (Signed)
   [facility] Inpatient Consult   10/26/2019  Jeremy Harper [birth year] [id number]  [facility] [[facility]] status: Pending  Patient is in network with [facility] and as a benefit of insurance, patient will receive post hospital follow up assessment for care and disease management from a [facility] RN Care Coordinator and will alert to disposition.  For questions or transition of care needs, please contact:  Charlesetta Shanks, RN BSN CCM [facility]  [phone] business mobile phone Toll free office [phone]  Fax number: [phone] [email or web address]
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
## 2019-10-29 ENCOUNTER — Encounter: Payer: Self-pay | Admitting: *Deleted

## 2019-10-29 ENCOUNTER — Other Ambulatory Visit: Payer: Self-pay | Admitting: *Deleted

## 2019-10-29 NOTE — Patient Outreach (Signed)
[facility] Jeremy Harper) Care Management  10/29/2019  Jeremy Harper Dec 28, 1980 [id number]  Transition of care call/case closure   Referral received:10/25/19 Initial outreach:10/29/19 Insurance: [employer]    Subjective: Initial successful telephone call to patient's preferred number in order to complete transition of care assessment; 2 HIPAA identifiers verified. Explained purpose of call and completed transition of care assessment.  States  is doing better  denies post-operative problems, says surgical incisions are unremarkable with staples , states surgical pain well managed with prescribed medications, tolerating soft diet, denies bowel or bladder problems has had bowel movement since discharge Patient spouse is  assisting with his recovery.  Discussed assessing Jeremy Harper benefits.  He denies  any ongoing health issues and says he does not need a referral to one of the [place] chronic disease management programs. He states it has been mentioned to him and in his history but he reports no problems and not on any medications.   Discussed [place] program for smoking cessation, he discussed that he has cut way down and may consider program for quitting and provided web address for registering for classes,  [email or web address]. He states that he does  not have the hospital indemnity plan he asked his wife that is with him. Patient asking about hospital bill and cost payment regarding admission , explained that I do not have access to that information and provided [facility] Billing contact number at [phone].  Offered  patient resources for finding a PCP , he states that his  wife has scheduled  initial visit to establish with Jeremy Reichmann PA.  He does not  use a [facility] outpatient pharmacy    Objective:  Mr. [name]  was hospitalized at [facility] from 2/12-2/16/21 for [facility] with sigmoid mesenteric contusion, s/p  repair serosal injury to sigmoid colon.   Comorbidities include: HIV disease, emphysema  He was discharged to home on 216/21 without the need for home health services or durable medical equipment per the discharge summary.    Assessment:  Patient voices good understanding of all discharge instructions.  See transition of care flowsheet for assessment details.   Plan:  Reviewed hospital discharge diagnosis of Repair of serosal injury of sigmoid colon.   and discharge treatment plan using hospital discharge instructions, assessing medication adherence, reviewing problems requiring provider notification, and discussing the importance of follow up with surgeon, primary care provider as directed.  Reviewed [place] healthy lifestyle program information to receive discounted premium for  2022  Step 1: Get annual physical between September 09, 2018 and March 09, 2020; Step 2: Complete your health assessment between September 10, 2019 and May 10, 2020 at [email or web address] Step 3:Identify your current health status and complete the corresponding action step between January 1, and May 10, 2020.    No ongoing care management needs identified so will close case to [facility] Care Management services and route successful outreach letter with [facility] Care Management pamphlet and 24 Hour Nurse Line Magnet to [employer] Care Management clinical pool to be mailed to patient's home address.    Jeremy Garibaldi, RN, BSN  [facility] Care Management,Care Management Coordinator  [phone]- Mobile [phone]- Toll Free Main Office

## 2019-11-02 MED FILL — DESCOVY 200-25 MG TABS: 200-25 | 30 days supply | Qty: 30 | Fill #5

## 2019-11-02 MED FILL — TIVICAY 50 MG TABLET: 50 | 30 days supply | Qty: 30 | Fill #5

## 2019-11-18 DIAGNOSIS — M25511 Pain in right shoulder: Secondary | ICD-10-CM | POA: Diagnosis not present

## 2019-11-18 DIAGNOSIS — M542 Cervicalgia: Secondary | ICD-10-CM | POA: Diagnosis not present

## 2019-12-03 MED FILL — TIVICAY 50 MG TABLET: 50 | 30 days supply | Qty: 30 | Fill #6

## 2019-12-03 MED FILL — DESCOVY 200-25 MG TABS: 200-25 | 30 days supply | Qty: 30 | Fill #6

## 2019-12-08 DIAGNOSIS — M25519 Pain in unspecified shoulder: Secondary | ICD-10-CM | POA: Diagnosis not present

## 2019-12-08 DIAGNOSIS — R0789 Other chest pain: Secondary | ICD-10-CM | POA: Diagnosis not present

## 2019-12-08 DIAGNOSIS — M542 Cervicalgia: Secondary | ICD-10-CM | POA: Diagnosis not present

## 2019-12-20 DIAGNOSIS — M25512 Pain in left shoulder: Secondary | ICD-10-CM | POA: Diagnosis not present

## 2019-12-24 DIAGNOSIS — M25512 Pain in left shoulder: Secondary | ICD-10-CM | POA: Diagnosis not present

## 2020-01-04 MED FILL — TIVICAY 50 MG TABLET: 50 | 30 days supply | Qty: 30 | Fill #7

## 2020-01-04 MED FILL — DESCOVY 200-25 MG TABS: 200-25 | 30 days supply | Qty: 30 | Fill #7

## 2020-01-05 DIAGNOSIS — M25512 Pain in left shoulder: Secondary | ICD-10-CM | POA: Diagnosis not present

## 2020-01-12 DIAGNOSIS — M75102 Unspecified rotator cuff tear or rupture of left shoulder, not specified as traumatic: Secondary | ICD-10-CM | POA: Diagnosis not present

## 2020-01-12 DIAGNOSIS — M19019 Primary osteoarthritis, unspecified shoulder: Secondary | ICD-10-CM | POA: Diagnosis not present

## 2020-01-12 DIAGNOSIS — M25512 Pain in left shoulder: Secondary | ICD-10-CM | POA: Diagnosis not present

## 2020-01-12 DIAGNOSIS — M7552 Bursitis of left shoulder: Secondary | ICD-10-CM | POA: Diagnosis not present

## 2020-01-18 DIAGNOSIS — M7552 Bursitis of left shoulder: Secondary | ICD-10-CM | POA: Diagnosis not present

## 2020-01-18 DIAGNOSIS — G8918 Other acute postprocedural pain: Secondary | ICD-10-CM | POA: Diagnosis not present

## 2020-01-18 DIAGNOSIS — M75122 Complete rotator cuff tear or rupture of left shoulder, not specified as traumatic: Secondary | ICD-10-CM | POA: Diagnosis not present

## 2020-01-18 DIAGNOSIS — M19012 Primary osteoarthritis, left shoulder: Secondary | ICD-10-CM | POA: Diagnosis not present

## 2020-01-18 DIAGNOSIS — S46012A Strain of muscle(s) and tendon(s) of the rotator cuff of left shoulder, initial encounter: Secondary | ICD-10-CM | POA: Diagnosis not present

## 2020-01-18 DIAGNOSIS — M7542 Impingement syndrome of left shoulder: Secondary | ICD-10-CM | POA: Diagnosis not present

## 2020-01-27 ENCOUNTER — Ambulatory Visit: Payer: 59 | Attending: Specialist

## 2020-01-27 ENCOUNTER — Other Ambulatory Visit: Payer: Self-pay

## 2020-01-27 DIAGNOSIS — M25512 Pain in left shoulder: Secondary | ICD-10-CM | POA: Insufficient documentation

## 2020-01-27 DIAGNOSIS — M25612 Stiffness of left shoulder, not elsewhere classified: Secondary | ICD-10-CM | POA: Insufficient documentation

## 2020-01-27 DIAGNOSIS — R252 Cramp and spasm: Secondary | ICD-10-CM | POA: Insufficient documentation

## 2020-01-27 DIAGNOSIS — M6281 Muscle weakness (generalized): Secondary | ICD-10-CM | POA: Insufficient documentation

## 2020-01-27 DIAGNOSIS — Z9889 Other specified postprocedural states: Secondary | ICD-10-CM | POA: Diagnosis not present

## 2020-01-27 NOTE — Patient Instructions (Signed)
Pendulkum, x 10-20  , scap retraction and elevation 3-5 reps 3x/day   Also ball squeeze duriong day ultiple reps

## 2020-01-27 NOTE — Therapy (Signed)
[facility] [address] [place], [place], [address] Phone: [phone]   Fax:  [phone]  Physical Therapy Evaluation  Patient Details  Name: Jeremy Harper MRN: [id number] Date of Birth: [birth year] Referring Provider (PT): Jeremy Cava, MD   Encounter Date: 01/27/2020  PT End of Session - 01/27/20 1020    Visit Number  1    Number of Visits  31    Date for PT Re-Evaluation  05/12/20    Authorization Type  UMR    PT Start Time  0920    PT Stop Time  1000    PT Time Calculation (min)  40 min    Activity Tolerance  Patient tolerated treatment well;Patient limited by pain    Behavior During Therapy  [facility] for tasks assessed/performed       Past Medical History:  Diagnosis Date  . AIDS (HCC) 12/07/2015  . Emphysema of lung (HCC) 01/02/2017  . Rash and nonspecific skin eruption 12/07/2015  . Smoker 01/02/2017  . Thrush 12/07/2015  . Zoster 12/28/2018    Past Surgical History:  Procedure Laterality Date  . LAPAROSCOPY N/A 10/22/2019   Procedure: LAPAROSCOPY DIAGNOSTIC;  Surgeon: Jeremy Monks, MD;  Location[facility];  Service: General;  Laterality: N/A;  . LAPAROTOMY N/A 10/22/2019   Procedure: EXPLORATORY LAPAROTOMY;  Surgeon: Jeremy Monks, MD;  Location[facility];  Service: General;  Laterality: N/A;  . SMALL BOWEL REPAIR N/A 10/22/2019   Procedure: Small Bowel Repair;  Surgeon: Jeremy Monks, MD;  [facility] OR;  Service: General;  Laterality: N/A;    There were no vitals filed for this visit.   Subjective Assessment - 01/27/20 1339    Subjective  LT  report RTC repair.   01/18/20.  He reort he is not using his LT arm and is out of the sling only for hygiene. He has pain but his is controlled with cold and meds.    Limitations  Lifting;House hold activities    Patient Stated Goals  To return to using LT arm for normal self care and home and work tasks.    Currently in Pain?  Yes    Pain Score  5     Pain  Location  Shoulder    Pain Orientation  Left    Pain Descriptors / Indicators  Aching    Pain Type  Surgical pain    Pain Onset  1 to 4 weeks ago    Pain Frequency  Constant    Aggravating Factors   moving arm    Pain Relieving Factors  rest meds         [facility] PT Assessment - 01/27/20 0001      Assessment   Referring Provider (PT)  Jeremy Cava, MD    Onset Date/Surgical Date  01/18/20    Hand Dominance  Right    Next MD Visit  02/02/20    Prior Therapy  No      Precautions   Precautions  Shoulder    Type of Shoulder Precautions  RTC repai    Shoulder Interventions  Shoulder sling/immobilizer    Required Braces or Orthoses  Sling      Restrictions   Weight Bearing Restrictions  Yes    LUE Weight Bearing  Non weight bearing      Balance Screen   Has the patient fallen in the past 6 months  No      Prior Function   Level of Independence  Needs assistance with homemaking;Needs assistance with ADLs    Vocation  Full time employment    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       Cognition   Overall Cognitive Status  Within Functional Limits for tasks assessed      ROM / Strength   AROM / PROM / Strength  AROM;PROM;Strength      AROM   Overall AROM Comments  Decreased LT shoulder elevtion      PROM   PROM Assessment Site  Shoulder    Right/Left Shoulder  Right    Right Shoulder Extension  20 Degrees    Right Shoulder Flexion  45 Degrees    Right Shoulder ABduction  70 Degrees    Right Shoulder External Rotation  0 Degrees      Strength   Overall Strength Comments  not tested  as he is 9 days post surgery.                   Objective measurements completed on examination: See above findings.      [facility] Adult PT Treatment/Exercise - 01/27/20 0001      Exercises   Exercises  Shoulder      Manual Therapy   Manual Therapy  Passive ROM    Passive ROM  Flexion / abduction /extension/ER x 10 rpes gentle              PT Education - 01/27/20 0956    Education Details  POC HEP ,FOTO results    Person(s) Educated  Patient    Methods  Explanation;Demonstration;Tactile cues;Verbal cues;Handout    Comprehension  Verbalized understanding;Returned demonstration;Verbal cues required       PT Short Term Goals - 01/27/20 1347      PT SHORT TERM GOAL #1   Title  He will be independent with initial HEP    Time  4    Period  Weeks    Status  New      PT SHORT TERM GOAL #2   Title  safely progress  into 4-6 weeks of protocol.    Time  4    Period  Weeks    Status  New      PT SHORT TERM GOAL #3   Title  No increased pain    Time  4    Period  Weeks    Status  New        PT Long Term Goals - 01/27/20 1348      PT LONG TERM GOAL #1   Title  He will be indpendent with all hEP    Time  16    Period  Weeks    Status  New      PT LONG TERM GOAL #2   Title  He will be able to lift  25# or higher with 1-2 max pain to be able to return to work.    Time  16    Period  Weeks    Status  New      PT LONG TERM GOAL #3   Title  He will have fiull ROM to be able to RTW safely    Time  16    Period  Weeks    Status  New      PT LONG TERM GOAL #4   Title  He wil have pain max 1-2/10 with all normal home tasks    Time  16    Period  Weeks  Status  New      PT LONG TERM GOAL #5   Title  Normal self care without compensation no pain    Time  16    Period  Weeks    Status  New      Additional Long Term Goals   Additional Long Term Goals  Yes      PT LONG TERM GOAL #6   Title  FOTO score will improve to 31% limited from 68% limited    Time  16    Period  Weeks    Status  New             Plan - 01/27/20 1020    Clinical Impression Statement  [name] in post [facility] and SAD ,DCR.  He is 9 days post surgery and eas given pendulum, elbow ROM and scapula ROM. He is not able to tolerate passive ROM to allowed ROM under protocol so we will work on this.     Examination-Activity Limitations  Reach Overhead;Hygiene/Grooming;Bathing;Lift    Examination-Participation Restrictions  Cleaning;Community Activity;Driving;Yard Work    Stability/Clinical Decision Making  Stable/Uncomplicated    Clinical Decision Making  Low    Rehab Potential  Good    PT Frequency  2x / week    PT Duration  12 weeks   to 16 weeks   PT Treatment/Interventions  Passive range of motion;Manual techniques;Patient/family education;Therapeutic exercise;Cryotherapy;Vasopneumatic Device;Electrical Stimulation    PT Next Visit Plan  manual. for ROM within protocol limits, manual and modalites for pain.  Review HEP    PT Home Exercise Plan  pendulum , scap AROM ,  elbow AROM, gripping.    Consulted and Agree with Plan of Care  Patient       Patient will benefit from skilled therapeutic intervention in order to improve the following deficits and impairments:  Pain, Impaired UE functional use, Decreased activity tolerance, Decreased range of motion, Increased muscle spasms, Decreased strength  Visit Diagnosis: Acute pain of left shoulder  Stiffness of left shoulder, not elsewhere classified  Muscle weakness (generalized)  Cramp and spasm  Status post left rotator cuff repair     Problem List Patient Active Problem List   Diagnosis Date Noted  . Contusion 10/22/2019  . Zoster 12/28/2018  . Emphysema of lung (HCC) 01/02/2017  . Smoker 01/02/2017  . Rash and nonspecific skin eruption 12/07/2015  . HIV disease (HCC) 03/06/2010    Caprice Red  PT 01/27/2020, 1:55 PM  [facility] [address] [place], [place], [address] Phone: [phone]   Fax:  [phone]  Name: Jeremy Harper MRN: [id number] Date of Birth: 26-Jun-1981

## 2020-02-01 ENCOUNTER — Ambulatory Visit: Payer: 59 | Admitting: Physical Therapy

## 2020-02-01 ENCOUNTER — Other Ambulatory Visit: Payer: Self-pay

## 2020-02-01 DIAGNOSIS — Z9889 Other specified postprocedural states: Secondary | ICD-10-CM

## 2020-02-01 DIAGNOSIS — M6281 Muscle weakness (generalized): Secondary | ICD-10-CM | POA: Diagnosis not present

## 2020-02-01 DIAGNOSIS — M25612 Stiffness of left shoulder, not elsewhere classified: Secondary | ICD-10-CM

## 2020-02-01 DIAGNOSIS — R252 Cramp and spasm: Secondary | ICD-10-CM

## 2020-02-01 DIAGNOSIS — M25512 Pain in left shoulder: Secondary | ICD-10-CM

## 2020-02-02 ENCOUNTER — Encounter: Payer: Self-pay | Admitting: Physical Therapy

## 2020-02-02 DIAGNOSIS — Z4789 Encounter for other orthopedic aftercare: Secondary | ICD-10-CM | POA: Diagnosis not present

## 2020-02-02 NOTE — Therapy (Signed)
[place], [place], [address] Phone: [phone]   Fax:  [phone]  Physical Therapy Treatment  Patient Details  Name: Jeremy Harper MRN: [id number] Date of Birth: 1981/03/09 Referring Provider (PT): Hart Robinsons, MD   Encounter Date: 02/01/2020  PT End of Session - 02/02/20 1319    Visit Number  2    Number of Visits  31    Date for PT Re-Evaluation  05/12/20    Authorization Type  UMR    PT Start Time  0300    PT Stop Time  0341    PT Time Calculation (min)  41 min    Activity Tolerance  Patient tolerated treatment well;Patient limited by pain    Behavior During Therapy  [facility] for tasks assessed/performed       Past Medical History:  Diagnosis Date  . AIDS ([place]) 12/07/2015  . Emphysema of lung ([place]) 01/02/2017  . Rash and nonspecific skin eruption 12/07/2015  . Smoker 01/02/2017  . Thrush 12/07/2015  . Zoster 12/28/2018    Past Surgical History:  Procedure Laterality Date  . LAPAROSCOPY N/A 10/22/2019   Procedure: LAPAROSCOPY DIAGNOSTIC;  Surgeon: Jesusita Oka, MD;  Location: [place];  Service: General;  Laterality: N/A;  . LAPAROTOMY N/A 10/22/2019   Procedure: EXPLORATORY LAPAROTOMY;  Surgeon: Jesusita Oka, MD;  Location: [place];  Service: General;  Laterality: N/A;  . SMALL BOWEL REPAIR N/A 10/22/2019   Procedure: Small Bowel Repair;  Surgeon: Jesusita Oka, MD;  Location: [place];  Service: General;  Laterality: N/A;    There were no vitals filed for this visit.  Subjective Assessment - 02/02/20 1317    Subjective  Patient reported a little soreness with penduliums but otherwise his shoulder has done well. he has mild soreness today.    Limitations  Lifting;House hold activities    Patient Stated Goals  To return to using LT arm for normal self care and home and work tasks.    Currently in Pain?  Yes    Pain Score  3     Pain Location  Shoulder    Pain Orientation  Left    Pain  Descriptors / Indicators  Aching    Pain Type  Chronic pain    Pain Onset  1 to 4 weeks ago    Pain Frequency  Constant    Aggravating Factors   moving arm    Pain Relieving Factors  rest and meds    Multiple Pain Sites  No                        [facility] Adult PT Treatment/Exercise - 02/02/20 0001      Shoulder Exercises: Standing   Other Standing Exercises  standing penmdulums circles side to side, and forward and back x15       Manual Therapy   Manual Therapy  Soft tissue mobilization;Joint mobilization    Joint Mobilization  gentle AP and inferiotr glides grade 1 and 2     Soft tissue mobilization  to upper trap and psoterior shoulder     Passive ROM  Flexion / abduction /extension/ER x 10 rpes gentle             PT Education - 02/02/20 1319    Education Details  reviewed POC going forward and technique with pendulums    Person(s) Educated  Patient    Methods  Explanation;Demonstration;Tactile cues;Verbal cues  Comprehension  Verbalized understanding;Verbal cues required;Returned demonstration;Tactile cues required       PT Short Term Goals - 01/27/20 1347      PT SHORT TERM GOAL #1   Title  He will be independent with initial HEP    Time  4    Period  Weeks    Status  New      PT SHORT TERM GOAL #2   Title  safely progress  into 4-6 weeks of protocol.    Time  4    Period  Weeks    Status  New      PT SHORT TERM GOAL #3   Title  No increased pain    Time  4    Period  Weeks    Status  New        PT Long Term Goals - 01/27/20 1348      PT LONG TERM GOAL #1   Title  He will be indpendent with all hEP    Time  16    Period  Weeks    Status  New      PT LONG TERM GOAL #2   Title  He will be able to lift  25# or higher with 1-2 max pain to be able to return to work.    Time  16    Period  Weeks    Status  New      PT LONG TERM GOAL #3   Title  He will have fiull ROM to be able to RTW safely    Time  16    Period  Weeks     Status  New      PT LONG TERM GOAL #4   Title  He wil have pain max 1-2/10 with all normal home tasks    Time  16    Period  Weeks    Status  New      PT LONG TERM GOAL #5   Title  Normal self care without compensation no pain    Time  16    Period  Weeks    Status  New      Additional Long Term Goals   Additional Long Term Goals  Yes      PT LONG TERM GOAL #6   Title  FOTO score will improve to 31% limited from 68% limited    Time  16    Period  Weeks    Status  New            Plan - 02/02/20 1011    Clinical Impression Statement  Patient tolerated treatment well. he is a bit limited in ER but that may be coming from gaurding. As he relaxed his ER inproved. His flexion is progressing as expected. Therapy reviewed pendulums with patient. Therapy will continue to progress per protocol.    Examination-Activity Limitations  Reach Overhead;Hygiene/Grooming;Bathing;Lift    Examination-Participation Restrictions  Cleaning;Community Activity;Driving;Yard Work    Stability/Clinical Decision Making  Stable/Uncomplicated    Clinical Decision Making  Low    Rehab Potential  Good    PT Frequency  2x / week    PT Duration  12 weeks    PT Treatment/Interventions  Passive range of motion;Manual techniques;Patient/family education;Therapeutic exercise;Cryotherapy;Vasopneumatic Device;Electrical Stimulation    PT Next Visit Plan  manual. for ROM within protocol limits, manual and modalites for pain.  Review HEP    PT Home Exercise Plan  pendulum , scap AROM ,  elbow  AROM, gripping.    Consulted and Agree with Plan of Care  Patient       Patient will benefit from skilled therapeutic intervention in order to improve the following deficits and impairments:  Pain, Impaired UE functional use, Decreased activity tolerance, Decreased range of motion, Increased muscle spasms, Decreased strength  Visit Diagnosis: Acute pain of left shoulder  Stiffness of left shoulder, not elsewhere  classified  Muscle weakness (generalized)  Cramp and spasm  Status post left rotator cuff repair     Problem List Patient Active Problem List   Diagnosis Date Noted  . Contusion 10/22/2019  . Zoster 12/28/2018  . Emphysema of lung (HCC) 01/02/2017  . Smoker 01/02/2017  . Rash and nonspecific skin eruption 12/07/2015  . HIV disease (HCC) 03/06/2010    Dessie Coma PT DPT  02/02/2020, 1:25 PM  [facility] [address] [place], [place], [address] Phone: [phone]   Fax:  [phone]  Name: Jeremy Harper MRN: [id number] Date of Birth: 1980/11/16

## 2020-02-03 ENCOUNTER — Ambulatory Visit: Payer: 59 | Admitting: Physical Therapy

## 2020-02-03 ENCOUNTER — Encounter: Payer: Self-pay | Admitting: Physical Therapy

## 2020-02-03 ENCOUNTER — Other Ambulatory Visit: Payer: Self-pay

## 2020-02-03 DIAGNOSIS — M6281 Muscle weakness (generalized): Secondary | ICD-10-CM | POA: Diagnosis not present

## 2020-02-03 DIAGNOSIS — R252 Cramp and spasm: Secondary | ICD-10-CM

## 2020-02-03 DIAGNOSIS — Z9889 Other specified postprocedural states: Secondary | ICD-10-CM

## 2020-02-03 DIAGNOSIS — M25612 Stiffness of left shoulder, not elsewhere classified: Secondary | ICD-10-CM

## 2020-02-03 DIAGNOSIS — M25512 Pain in left shoulder: Secondary | ICD-10-CM | POA: Diagnosis not present

## 2020-02-03 NOTE — Therapy (Signed)
[facility] [address] [place], [place], [address] Phone: [phone]   Fax:  [phone]  Physical Therapy Treatment  Patient Details  Name: Jeremy Harper MRN: [id number] Date of Birth: September 23, 1980 Referring Provider (PT): Valma Cava, MD   Encounter Date: 02/03/2020  PT End of Session - 02/03/20 1013    Visit Number  3    Number of Visits  31    Date for PT Re-Evaluation  05/12/20    Authorization Type  UMR    PT Start Time  1012    PT Stop Time  1100    PT Time Calculation (min)  48 min    Activity Tolerance  Patient tolerated treatment well;Patient limited by pain    Behavior During Therapy  [facility] for tasks assessed/performed       Past Medical History:  Diagnosis Date  . AIDS (HCC) 12/07/2015  . Emphysema of lung (HCC) 01/02/2017  . Rash and nonspecific skin eruption 12/07/2015  . Smoker 01/02/2017  . Thrush 12/07/2015  . Zoster 12/28/2018    Past Surgical History:  Procedure Laterality Date  . LAPAROSCOPY N/A 10/22/2019   Procedure: LAPAROSCOPY DIAGNOSTIC;  Surgeon: Diamantina Monks, MD;  [facility];  Service: General;  Laterality: N/A;  . LAPAROTOMY N/A 10/22/2019   Procedure: EXPLORATORY LAPAROTOMY;  Surgeon: Diamantina Monks, MD;  [facility];  Service: General;  Laterality: N/A;  . SMALL BOWEL REPAIR N/A 10/22/2019   Procedure: Small Bowel Repair;  Surgeon: Diamantina Monks, MD;  [facility];  Service: General;  Laterality: N/A;    There were no vitals filed for this visit.  Subjective Assessment - 02/03/20 1109    Subjective  Patient reports shoulder continues to improve. A little discomfort today.    Patient Stated Goals  To return to using LT arm for normal self care and home and work tasks.    Currently in Pain?  Yes    Pain Score  3     Pain Location  Shoulder    Pain Orientation  Left    Pain Descriptors / Indicators  Aching;Tightness;Sore    Pain Type  Surgical pain    Pain Onset  1  to 4 weeks ago    Pain Frequency  Constant         [facility] PT Assessment - 02/03/20 0001      PROM   Right Shoulder Flexion  90 Degrees    Right Shoulder External Rotation  25 Degrees                    [facility] Adult PT Treatment/Exercise - 02/03/20 0001      Shoulder Exercises: Seated   Retraction  10 reps   3 sec hold   Retraction Limitations  cue to avoid shrug      Shoulder Exercises: Standing   Other Standing Exercises  Standing pendulum in all planes      Shoulder Exercises: Stretch   Other Shoulder Stretches  Upper trap and levator scap stretch x20 sec each      Modalities   Modalities  Vasopneumatic   performed post session, not included in billed minutes     Vasopneumatic   Number Minutes Vasopneumatic   15 minutes    Vasopnuematic Location   Shoulder    Vasopneumatic Pressure  Medium    Vasopneumatic Temperature   34      Manual Therapy   Manual Therapy  Passive ROM  Passive ROM  Flexion, abduction, ER to tolerance 10 sec hol x10 each             PT Education - 02/03/20 1012    Education Details  HEP, continues precautions    Person(s) Educated  Patient    Methods  Explanation;Demonstration;Tactile cues;Verbal cues    Comprehension  Verbalized understanding;Returned demonstration;Verbal cues required;Tactile cues required;Need further instruction       PT Short Term Goals - 01/27/20 1347      PT SHORT TERM GOAL #1   Title  He will be independent with initial HEP    Time  4    Period  Weeks    Status  New      PT SHORT TERM GOAL #2   Title  safely progress  into 4-6 weeks of protocol.    Time  4    Period  Weeks    Status  New      PT SHORT TERM GOAL #3   Title  No increased pain    Time  4    Period  Weeks    Status  New        PT Long Term Goals - 01/27/20 1348      PT LONG TERM GOAL #1   Title  He will be indpendent with all hEP    Time  16    Period  Weeks    Status  New      PT LONG TERM GOAL #2   Title   He will be able to lift  25# or higher with 1-2 max pain to be able to return to work.    Time  16    Period  Weeks    Status  New      PT LONG TERM GOAL #3   Title  He will have fiull ROM to be able to RTW safely    Time  16    Period  Weeks    Status  New      PT LONG TERM GOAL #4   Title  He wil have pain max 1-2/10 with all normal home tasks    Time  16    Period  Weeks    Status  New      PT LONG TERM GOAL #5   Title  Normal self care without compensation no pain    Time  16    Period  Weeks    Status  New      Additional Long Term Goals   Additional Long Term Goals  Yes      PT LONG TERM GOAL #6   Title  FOTO score will improve to 31% limited from 68% limited    Time  16    Period  Weeks    Status  New            Plan - 02/03/20 1013    Clinical Impression Statement  Patient tolerated therapy well with no adverse effects. He remains compliant with sling wear and exercises. He exhibits improved PROM, does report increased pain at end range. He seems to be improving well and progressing as expected. He would benefit from continued skilled PT to progress his motion and as protocol allows.    PT Treatment/Interventions  Passive range of motion;Manual techniques;Patient/family education;Therapeutic exercise;Cryotherapy;Vasopneumatic Device;Electrical Stimulation    PT Next Visit Plan  manual. for ROM within protocol limits, manual and modalites for pain.  Review HEP  PT Home Exercise Plan  pendulum , scap AROM ,  elbow AROM, gripping.    Consulted and Agree with Plan of Care  Patient       Patient will benefit from skilled therapeutic intervention in order to improve the following deficits and impairments:  Pain, Impaired UE functional use, Decreased activity tolerance, Decreased range of motion, Increased muscle spasms, Decreased strength  Visit Diagnosis: Acute pain of left shoulder  Stiffness of left shoulder, not elsewhere classified  Muscle weakness  (generalized)  Cramp and spasm  Status post left rotator cuff repair     Problem List Patient Active Problem List   Diagnosis Date Noted  . Contusion 10/22/2019  . Zoster 12/28/2018  . Emphysema of lung ([place]) 01/02/2017  . Smoker 01/02/2017  . Rash and nonspecific skin eruption 12/07/2015  . HIV disease ([place]) 03/06/2010    Hilda Blades, PT, DPT, LAT, ATC 02/03/20  11:10 AM Phone: [phone] Fax:  [facility] [address] [place], [place], [address] Phone: [phone]   Fax:  [phone]  Name: Jeremy Harper MRN: [id number] Date of Birth: [birth year]

## 2020-02-08 ENCOUNTER — Ambulatory Visit: Payer: 59 | Attending: Specialist | Admitting: Physical Therapy

## 2020-02-08 ENCOUNTER — Encounter: Payer: Self-pay | Admitting: Physical Therapy

## 2020-02-08 ENCOUNTER — Other Ambulatory Visit: Payer: Self-pay

## 2020-02-08 DIAGNOSIS — M25612 Stiffness of left shoulder, not elsewhere classified: Secondary | ICD-10-CM | POA: Insufficient documentation

## 2020-02-08 DIAGNOSIS — M6281 Muscle weakness (generalized): Secondary | ICD-10-CM | POA: Diagnosis not present

## 2020-02-08 DIAGNOSIS — R252 Cramp and spasm: Secondary | ICD-10-CM | POA: Insufficient documentation

## 2020-02-08 DIAGNOSIS — M25512 Pain in left shoulder: Secondary | ICD-10-CM | POA: Diagnosis not present

## 2020-02-08 DIAGNOSIS — Z9889 Other specified postprocedural states: Secondary | ICD-10-CM | POA: Diagnosis not present

## 2020-02-08 DIAGNOSIS — R600 Localized edema: Secondary | ICD-10-CM | POA: Insufficient documentation

## 2020-02-08 MED FILL — TIVICAY 50 MG TABLET: 50 | 30 days supply | Qty: 30 | Fill #8

## 2020-02-08 MED FILL — DESCOVY 200-25 MG TABS: 200-25 | 30 days supply | Qty: 30 | Fill #8

## 2020-02-08 NOTE — Patient Instructions (Signed)
Access Code: [email or web address] URL: [email or web address] Date: 02/08/2020 Prepared by: Rosana Hoes  Exercises Supine Shoulder External Rotation with Dowel - 2 x daily - 7 x weekly - 10 reps - 10 seconds hold Seated Shoulder Flexion Towel Slide at Table Top - 2 x daily - 7 x weekly - 10 reps - 10 seconds hold

## 2020-02-08 NOTE — Therapy (Signed)
[facility] Outpatient Rehabilitation [facility] [address] [place], [place], [address] Phone: [phone]   Fax:  [phone]  Physical Therapy Treatment  Patient Details  Name: Jeremy Harper MRN: [id number] Date of Birth: 06-27-1981 Referring Provider (PT): Valma Cava, MD   Encounter Date: 02/08/2020  PT End of Session - 02/08/20 0952    Visit Number  4    Number of Visits  31    Date for PT Re-Evaluation  05/12/20    Authorization Type  UMR    PT Start Time  0920    PT Stop Time  1010    PT Time Calculation (min)  50 min    Activity Tolerance  Patient tolerated treatment well    Behavior During Therapy  [facility] for tasks assessed/performed       Past Medical History:  Diagnosis Date  . AIDS (HCC) 12/07/2015  . Emphysema of lung (HCC) 01/02/2017  . Rash and nonspecific skin eruption 12/07/2015  . Smoker 01/02/2017  . Thrush 12/07/2015  . Zoster 12/28/2018    Past Surgical History:  Procedure Laterality Date  . LAPAROSCOPY N/A 10/22/2019   Procedure: LAPAROSCOPY DIAGNOSTIC;  Surgeon: Diamantina Monks, MD;  Location: [facility] OR;  Service: General;  Laterality: N/A;  . LAPAROTOMY N/A 10/22/2019   Procedure: EXPLORATORY LAPAROTOMY;  Surgeon: Diamantina Monks, MD;  Location: [facility] OR;  Service: General;  Laterality: N/A;  . SMALL BOWEL REPAIR N/A 10/22/2019   Procedure: Small Bowel Repair;  Surgeon: Diamantina Monks, MD;  Location: [facility] OR;  Service: General;  Laterality: N/A;    There were no vitals filed for this visit.  Subjective Assessment - 02/08/20 0924    Subjective  Patient reports his shoulder is feeling better. Less pain with exercises.    Patient Stated Goals  To return to using LT arm for normal self care and home and work tasks.    Currently in Pain?  Yes    Pain Score  2     Pain Location  Shoulder    Pain Orientation  Left    Pain Descriptors / Indicators  Aching;Tightness;Sore    Pain Type  Surgical pain    Pain Onset  1 to 4 weeks ago    Pain  Frequency  Intermittent         [facility] PT Assessment - 02/08/20 0001      Assessment   Medical Diagnosis  Left rotator cuff repair    Referring Provider (PT)  Valma Cava, MD    Onset Date/Surgical Date  01/18/20      PROM   Right Shoulder Flexion  100 Degrees    Right Shoulder External Rotation  30 Degrees                    [facility] Adult PT Treatment/Exercise - 02/08/20 0001      Exercises   Exercises  Shoulder      Shoulder Exercises: Supine   External Rotation  PROM;5 reps   10 sec hold   External Rotation Limitations  dowel      Shoulder Exercises: Seated   Retraction  5 reps   3 sec hold   Flexion  PROM;5 reps   10 sec hold   Flexion Limitations  table slide      Modalities   Modalities  Vasopneumatic   performed post session, not included in billed minutes     Vasopneumatic   Number Minutes Vasopneumatic   10 minutes  Vasopnuematic Location   Shoulder    Vasopneumatic Pressure  Medium    Vasopneumatic Temperature   34      Manual Therapy   Manual Therapy  Passive ROM;Joint mobilization    Joint Mobilization  Grade I-II GHJ to reduce guarding    Passive ROM  Flexion, abduction, ER to tolerance 10 sec hol x10 each             PT Education - 02/08/20 0952    Education Details  HEP update    Person(s) Educated  Patient    Methods  Explanation;Demonstration;Tactile cues;Verbal cues;Handout    Comprehension  Verbalized understanding;Returned demonstration;Verbal cues required;Tactile cues required;Need further instruction       PT Short Term Goals - 01/27/20 1347      PT SHORT TERM GOAL #1   Title  He will be independent with initial HEP    Time  4    Period  Weeks    Status  New      PT SHORT TERM GOAL #2   Title  safely progress  into 4-6 weeks of protocol.    Time  4    Period  Weeks    Status  New      PT SHORT TERM GOAL #3   Title  No increased pain    Time  4    Period  Weeks    Status  New        PT Long  Term Goals - 01/27/20 1348      PT LONG TERM GOAL #1   Title  He will be indpendent with all hEP    Time  16    Period  Weeks    Status  New      PT LONG TERM GOAL #2   Title  He will be able to lift  25# or higher with 1-2 max pain to be able to return to work.    Time  16    Period  Weeks    Status  New      PT LONG TERM GOAL #3   Title  He will have fiull ROM to be able to RTW safely    Time  16    Period  Weeks    Status  New      PT LONG TERM GOAL #4   Title  He wil have pain max 1-2/10 with all normal home tasks    Time  16    Period  Weeks    Status  New      PT LONG TERM GOAL #5   Title  Normal self care without compensation no pain    Time  16    Period  Weeks    Status  New      Additional Long Term Goals   Additional Long Term Goals  Yes      PT LONG TERM GOAL #6   Title  FOTO score will improve to 31% limited from 68% limited    Time  16    Period  Weeks    Status  New            Plan - 02/08/20 9381    Clinical Impression Statement  Patient tolerated therapy well with no adverse effects. He continues to progress well with PROM but does not increased discomfort at all end ranges. He was provided exercises to work on shoulder flexion and ER PROM at home with good tolerance in clinic.  Required cueing to keep shoulder relaxed and avoid shoulder shrug, and to remain within a comfortable range. He would benefit from continued skilled PT to progress his motion and as protocol allows.    PT Treatment/Interventions  Passive range of motion;Manual techniques;Patient/family education;Therapeutic exercise;Cryotherapy;Vasopneumatic Device;Electrical Stimulation    PT Next Visit Plan  manual. for ROM within protocol limits, manual and modalites for pain.  Review HEP    PT Home Exercise Plan  pendulum, scap AROM,  elbow AROM, gripping, supine shoulder ER PROM with dowel, seated table slide flexion PROM    Consulted and Agree with Plan of Care  Patient        Patient will benefit from skilled therapeutic intervention in order to improve the following deficits and impairments:  Pain, Impaired UE functional use, Decreased activity tolerance, Decreased range of motion, Increased muscle spasms, Decreased strength  Visit Diagnosis: Acute pain of left shoulder  Stiffness of left shoulder, not elsewhere classified  Muscle weakness (generalized)  Cramp and spasm  Status post left rotator cuff repair     Problem List Patient Active Problem List   Diagnosis Date Noted  . Contusion 10/22/2019  . Zoster 12/28/2018  . Emphysema of lung (HCC) 01/02/2017  . Smoker 01/02/2017  . Rash and nonspecific skin eruption 12/07/2015  . HIV disease (HCC) 03/06/2010    Rosana Hoes, PT, DPT, LAT, ATC 02/08/20  10:11 AM Phone: [phone] Fax: [phone]   [facility] Outpatient Rehabilitation [facility] [address] [place], [place], [address] Phone: [phone]   Fax:  [phone]  Name: Jeremy Harper MRN: [id number] Date of Birth: [birth year]

## 2020-02-10 ENCOUNTER — Encounter: Payer: Self-pay | Admitting: Physical Therapy

## 2020-02-10 ENCOUNTER — Ambulatory Visit: Payer: 59 | Admitting: Physical Therapy

## 2020-02-10 ENCOUNTER — Other Ambulatory Visit: Payer: Self-pay

## 2020-02-10 DIAGNOSIS — M6281 Muscle weakness (generalized): Secondary | ICD-10-CM | POA: Diagnosis not present

## 2020-02-10 DIAGNOSIS — M25612 Stiffness of left shoulder, not elsewhere classified: Secondary | ICD-10-CM | POA: Diagnosis not present

## 2020-02-10 DIAGNOSIS — Z9889 Other specified postprocedural states: Secondary | ICD-10-CM | POA: Diagnosis not present

## 2020-02-10 DIAGNOSIS — M25512 Pain in left shoulder: Secondary | ICD-10-CM | POA: Diagnosis not present

## 2020-02-10 DIAGNOSIS — R252 Cramp and spasm: Secondary | ICD-10-CM

## 2020-02-10 DIAGNOSIS — R600 Localized edema: Secondary | ICD-10-CM | POA: Diagnosis not present

## 2020-02-11 ENCOUNTER — Encounter: Payer: Self-pay | Admitting: Physical Therapy

## 2020-02-11 NOTE — Therapy (Signed)
[facility] Outpatient Rehabilitation [facility] [address] [place], [place], [address] Phone: [phone]   Fax:  [phone]  Physical Therapy Treatment  Patient Details  Name: Jeremy Harper MRN: [id number] Date of Birth: 1980-12-27 Referring Provider (PT): Valma Cava, MD   Encounter Date: 02/10/2020  PT End of Session - 02/11/20 0931    Visit Number  5    Number of Visits  31    Date for PT Re-Evaluation  05/12/20    Authorization Type  UMR    PT Start Time  1154   Patient 9 minutes late   PT Stop Time  1247    PT Time Calculation (min)  53 min    Activity Tolerance  Patient tolerated treatment well    Behavior During Therapy  [facility] for tasks assessed/performed       Past Medical History:  Diagnosis Date  . AIDS (HCC) 12/07/2015  . Emphysema of lung (HCC) 01/02/2017  . Rash and nonspecific skin eruption 12/07/2015  . Smoker 01/02/2017  . Thrush 12/07/2015  . Zoster 12/28/2018    Past Surgical History:  Procedure Laterality Date  . LAPAROSCOPY N/A 10/22/2019   Procedure: LAPAROSCOPY DIAGNOSTIC;  Surgeon: Diamantina Monks, MD;  [facility]: [facility] OR;  Service: General;  Laterality: N/A;  . LAPAROTOMY N/A 10/22/2019   Procedure: EXPLORATORY LAPAROTOMY;  Surgeon: Diamantina Monks, MD;  Location: [facility] OR;  Service: General;  Laterality: N/A;  . SMALL BOWEL REPAIR N/A 10/22/2019   Procedure: Small Bowel Repair;  Surgeon: Diamantina Monks, MD;  Location: [facility] OR;  Service: General;  Laterality: N/A;    There were no vitals filed for this visit.  Subjective Assessment - 02/10/20 1203    Subjective  Patient reports his pain is controlleed today it is just a little sore.    Limitations  Lifting;House hold activities    Patient Stated Goals  To return to using LT arm for normal self care and home and work tasks.    Currently in Pain?  Yes    Pain Location  Shoulder    Pain Orientation  Left    Pain Descriptors / Indicators  Aching    Pain Type  Surgical pain    Pain Onset  1 to 4 weeks ago    Pain Frequency  Intermittent    Aggravating Factors   moving arm    Pain Relieving Factors  rest and meds    Multiple Pain Sites  No                        [facility] Adult PT Treatment/Exercise - 02/11/20 0001      Shoulder Exercises: Supine   External Rotation  --   10 sec hold   External Rotation Limitations  with dowel min cuing for technique 5 second hold 10x       Shoulder Exercises: Seated   Retraction Limitations  seated x20       Shoulder Exercises: Standing   Other Standing Exercises  4 way pendulums 1 min each       Modalities   Modalities  Vasopneumatic      Vasopneumatic   Number Minutes Vasopneumatic   10 minutes    Vasopnuematic Location   Shoulder    Vasopneumatic Pressure  Medium    Vasopneumatic Temperature   34      Manual Therapy   Manual Therapy  Passive ROM;Joint mobilization    Joint Mobilization  Grade I-II GHJ  to reduce guarding    Passive ROM  Flexion, abduction, ER to tolerance 10 sec hol x10 each             PT Education - 02/11/20 0930    Education Details  reivewed HEp and symptom management    Person(s) Educated  Patient    Methods  Explanation;Demonstration;Tactile cues;Verbal cues    Comprehension  Verbalized understanding;Verbal cues required;Returned demonstration;Tactile cues required       PT Short Term Goals - 01/27/20 1347      PT SHORT TERM GOAL #1   Title  He will be independent with initial HEP    Time  4    Period  Weeks    Status  New      PT SHORT TERM GOAL #2   Title  safely progress  into 4-6 weeks of protocol.    Time  4    Period  Weeks    Status  New      PT SHORT TERM GOAL #3   Title  No increased pain    Time  4    Period  Weeks    Status  New        PT Long Term Goals - 01/27/20 1348      PT LONG TERM GOAL #1   Title  He will be indpendent with all hEP    Time  16    Period  Weeks    Status  New      PT LONG TERM GOAL #2   Title  He will be  able to lift  25# or higher with 1-2 max pain to be able to return to work.    Time  16    Period  Weeks    Status  New      PT LONG TERM GOAL #3   Title  He will have fiull ROM to be able to RTW safely    Time  16    Period  Weeks    Status  New      PT LONG TERM GOAL #4   Title  He wil have pain max 1-2/10 with all normal home tasks    Time  16    Period  Weeks    Status  New      PT LONG TERM GOAL #5   Title  Normal self care without compensation no pain    Time  16    Period  Weeks    Status  New      Additional Long Term Goals   Additional Long Term Goals  Yes      PT LONG TERM GOAL #6   Title  FOTO score will improve to 31% limited from 68% limited    Time  16    Period  Weeks    Status  New            Plan - 02/11/20 0932    Clinical Impression Statement  Patient was limited in ER today to begin. It appear to be gaurding. As he relaxed his range opened up. He was shown how to perfrom self stretching at home. He required min cuing for technique, He was advised to stretch at home but dont be overly agressive. His flexion is progressing as expected.    Examination-Activity Limitations  Reach Overhead;Hygiene/Grooming;Bathing;Lift    Examination-Participation Restrictions  Cleaning;Community Activity;Driving;Yard Work    Stability/Clinical Decision Making  Stable/Uncomplicated    Designer, jewellery  Low    Rehab Potential  Good    PT Frequency  2x / week    PT Duration  12 weeks    PT Treatment/Interventions  Passive range of motion;Manual techniques;Patient/family education;Therapeutic exercise;Cryotherapy;Vasopneumatic Device;Electrical Stimulation    PT Next Visit Plan  manual. for ROM within protocol limits, manual and modalites for pain.  Review HEP    PT Home Exercise Plan  pendulum, scap AROM,  elbow AROM, gripping, supine shoulder ER PROM with dowel, seated table slide flexion PROM    Consulted and Agree with Plan of Care  Patient       Patient  will benefit from skilled therapeutic intervention in order to improve the following deficits and impairments:  Pain, Impaired UE functional use, Decreased activity tolerance, Decreased range of motion, Increased muscle spasms, Decreased strength  Visit Diagnosis: Acute pain of left shoulder  Stiffness of left shoulder, not elsewhere classified  Muscle weakness (generalized)  Cramp and spasm  Status post left rotator cuff repair     Problem List Patient Active Problem List   Diagnosis Date Noted  . Contusion 10/22/2019  . Zoster 12/28/2018  . Emphysema of lung (HCC) 01/02/2017  . Smoker 01/02/2017  . Rash and nonspecific skin eruption 12/07/2015  . HIV disease (HCC) 03/06/2010    Dessie Coma PT DPT  02/11/2020, 9:51 AM  [facility] [address] [place], [place], [address] Phone: [phone]   Fax:  [phone]  Name: Jeremy Harper MRN: [id number] Date of Birth: April 15, 1981

## 2020-02-14 ENCOUNTER — Other Ambulatory Visit: Payer: Self-pay

## 2020-02-14 ENCOUNTER — Ambulatory Visit: Payer: 59 | Admitting: Physical Therapy

## 2020-02-14 ENCOUNTER — Encounter: Payer: Self-pay | Admitting: Physical Therapy

## 2020-02-14 DIAGNOSIS — R600 Localized edema: Secondary | ICD-10-CM

## 2020-02-14 DIAGNOSIS — M6281 Muscle weakness (generalized): Secondary | ICD-10-CM

## 2020-02-14 DIAGNOSIS — M25612 Stiffness of left shoulder, not elsewhere classified: Secondary | ICD-10-CM

## 2020-02-14 DIAGNOSIS — M25512 Pain in left shoulder: Secondary | ICD-10-CM | POA: Diagnosis not present

## 2020-02-14 DIAGNOSIS — Z9889 Other specified postprocedural states: Secondary | ICD-10-CM | POA: Diagnosis not present

## 2020-02-14 DIAGNOSIS — R252 Cramp and spasm: Secondary | ICD-10-CM

## 2020-02-14 NOTE — Therapy (Signed)
[facility] Outpatient Rehabilitation [facility] [address] [place], [place], [address] Phone: [phone]   Fax:  [phone]  Physical Therapy Treatment  Patient Details  Name: Jeremy Harper MRN: [id number] Date of Birth: [birth year] Referring Provider (PT): Valma Cava, MD   Encounter Date: 02/14/2020  PT End of Session - 02/14/20 0942    Visit Number  6    Number of Visits  31    Date for PT Re-Evaluation  05/12/20    Authorization Type  UMR    PT Start Time  0900    PT Stop Time  0945    PT Time Calculation (min)  45 min    Activity Tolerance  Patient tolerated treatment well    Behavior During Therapy  [facility] for tasks assessed/performed       Past Medical History:  Diagnosis Date   AIDS (HCC) 12/07/2015   Emphysema of lung (HCC) 01/02/2017   Rash and nonspecific skin eruption 12/07/2015   Smoker 01/02/2017   Thrush 12/07/2015   Zoster 12/28/2018    Past Surgical History:  Procedure Laterality Date   LAPAROSCOPY N/A 10/22/2019   Procedure: LAPAROSCOPY DIAGNOSTIC;  Surgeon: Diamantina Monks, MD;  Location: [facility] OR;  Service: General;  Laterality: N/A;   LAPAROTOMY N/A 10/22/2019   Procedure: EXPLORATORY LAPAROTOMY;  Surgeon: Diamantina Monks, MD;  Location: [facility] OR;  Service: General;  Laterality: N/A;   SMALL BOWEL REPAIR N/A 10/22/2019   Procedure: Small Bowel Repair;  Surgeon: Diamantina Monks, MD;  Location: [facility] OR;  Service: General;  Laterality: N/A;    There were no vitals filed for this visit.  Subjective Assessment - 02/14/20 0939    Subjective  Patient reports he has been working on external rotation at home. Pain is controlled, mostly soreness and pain with external rotation.    Limitations  Lifting;House hold activities    Patient Stated Goals  To return to using LT arm for normal self care and home and work tasks.    Currently in Pain?  Yes    Pain Score  2     Pain Location  Shoulder    Pain Orientation  Left    Pain Descriptors  / Indicators  Aching    Pain Type  Surgical pain    Pain Onset  1 to 4 weeks ago    Multiple Pain Sites  No                        [facility] Adult PT Treatment/Exercise - 02/14/20 0001      Shoulder Exercises: Supine   External Rotation  PROM;5 reps   10 sec hold   Flexion  PROM;5 reps   10 sec hold   ABduction  PROM;5 reps   10 sec     Shoulder Exercises: Seated   Retraction Limitations  seated x20       Shoulder Exercises: Standing   Other Standing Exercises  4 way pendulums 1 min each       Modalities   Modalities  Vasopneumatic      Vasopneumatic   Number Minutes Vasopneumatic   10 minutes    Vasopnuematic Location   Shoulder    Vasopneumatic Pressure  Medium    Vasopneumatic Temperature   34      Manual Therapy   Manual Therapy  Passive ROM;Joint mobilization    Joint Mobilization  Grade I-II GHJ to reduce guarding    Passive ROM  Flexion,  abduction, ER to tolerance 10 sec hol x10 each             PT Education - 02/14/20 0941    Education Details  Reviewed HEP.    Person(s) Educated  Patient    Methods  Explanation;Demonstration;Tactile cues;Verbal cues    Comprehension  Verbalized understanding;Verbal cues required;Returned demonstration;Tactile cues required       PT Short Term Goals - 01/27/20 1347      PT SHORT TERM GOAL #1   Title  He will be independent with initial HEP    Time  4    Period  Weeks    Status  New      PT SHORT TERM GOAL #2   Title  safely progress  into 4-6 weeks of protocol.    Time  4    Period  Weeks    Status  New      PT SHORT TERM GOAL #3   Title  No increased pain    Time  4    Period  Weeks    Status  New        PT Long Term Goals - 01/27/20 1348      PT LONG TERM GOAL #1   Title  He will be indpendent with all hEP    Time  16    Period  Weeks    Status  New      PT LONG TERM GOAL #2   Title  He will be able to lift  25# or higher with 1-2 max pain to be able to return to work.    Time   16    Period  Weeks    Status  New      PT LONG TERM GOAL #3   Title  He will have fiull ROM to be able to RTW safely    Time  16    Period  Weeks    Status  New      PT LONG TERM GOAL #4   Title  He wil have pain max 1-2/10 with all normal home tasks    Time  16    Period  Weeks    Status  New      PT LONG TERM GOAL #5   Title  Normal self care without compensation no pain    Time  16    Period  Weeks    Status  New      Additional Long Term Goals   Additional Long Term Goals  Yes      PT LONG TERM GOAL #6   Title  FOTO score will improve to 31% limited from 68% limited    Time  16    Period  Weeks    Status  New            Plan - 02/14/20 1829    Clinical Impression Statement  Pt remains limited by ER. Pt initially with only up to neutral ER, by end of session gained ~10 to 20 deg. Treatment focused on continuing RTC protocol, PROM, and scapular strengthening.    Examination-Activity Limitations  Reach Overhead;Hygiene/Grooming;Bathing;Lift    Examination-Participation Restrictions  Cleaning;Community Activity;Driving;Yard Work    Stability/Clinical Decision Making  Stable/Uncomplicated    Clinical Decision Making  Low    Rehab Potential  Good    PT Frequency  2x / week    PT Duration  12 weeks    PT Treatment/Interventions  Passive range of motion;Manual  techniques;Patient/family education;Therapeutic exercise;Cryotherapy;Vasopneumatic Device;Electrical Stimulation    PT Next Visit Plan  manual. for ROM within protocol limits, manual and modalites for pain.  Review HEP    PT Home Exercise Plan  pendulum, scap AROM,  elbow AROM, gripping, supine shoulder ER PROM with dowel, seated table slide flexion PROM    Consulted and Agree with Plan of Care  Patient       Patient will benefit from skilled therapeutic intervention in order to improve the following deficits and impairments:  Pain, Impaired UE functional use, Decreased activity tolerance, Decreased range of  motion, Increased muscle spasms, Decreased strength  Visit Diagnosis: Acute pain of left shoulder  Stiffness of left shoulder, not elsewhere classified  Muscle weakness (generalized)  Cramp and spasm  Status post left rotator cuff repair  Localized edema     Problem List Patient Active Problem List   Diagnosis Date Noted   Contusion 10/22/2019   Zoster 12/28/2018   Emphysema of lung (HCC) 01/02/2017   Smoker 01/02/2017   Rash and nonspecific skin eruption 12/07/2015   HIV disease (HCC) 03/06/2010    Chryl Holten April Dell Ponto PT, DPT 02/14/2020, 9:57 AM  [facility] [address] [place], [place], [address] Phone: [phone]   Fax:  [phone]  Name: GUMARO BRIGHTBILL MRN: [id number] Date of Birth: 1980/11/01

## 2020-02-16 ENCOUNTER — Encounter: Payer: Self-pay | Admitting: Physical Therapy

## 2020-02-16 ENCOUNTER — Other Ambulatory Visit: Payer: Self-pay

## 2020-02-16 ENCOUNTER — Ambulatory Visit: Payer: 59 | Admitting: Physical Therapy

## 2020-02-16 DIAGNOSIS — R252 Cramp and spasm: Secondary | ICD-10-CM | POA: Diagnosis not present

## 2020-02-16 DIAGNOSIS — M25512 Pain in left shoulder: Secondary | ICD-10-CM

## 2020-02-16 DIAGNOSIS — R600 Localized edema: Secondary | ICD-10-CM | POA: Diagnosis not present

## 2020-02-16 DIAGNOSIS — Z9889 Other specified postprocedural states: Secondary | ICD-10-CM | POA: Diagnosis not present

## 2020-02-16 DIAGNOSIS — M6281 Muscle weakness (generalized): Secondary | ICD-10-CM | POA: Diagnosis not present

## 2020-02-16 DIAGNOSIS — M25612 Stiffness of left shoulder, not elsewhere classified: Secondary | ICD-10-CM | POA: Diagnosis not present

## 2020-02-16 NOTE — Therapy (Signed)
[place], [place], [address] Phone: [phone]   Fax:  [phone]  Physical Therapy Treatment  Patient Details  Name: Jeremy Harper MRN: [id number] Date of Birth: 1981/06/25 Referring Provider (PT): Hart Robinsons, MD   Encounter Date: 02/16/2020  PT End of Session - 02/16/20 1042    Visit Number  7    Number of Visits  31    Date for PT Re-Evaluation  05/12/20    Authorization Type  UMR    PT Start Time  [phone]   Patient 13 minutes late   PT Stop Time  1030    PT Time Calculation (min)  47 min    Activity Tolerance  Patient tolerated treatment well    Behavior During Therapy  [facility] for tasks assessed/performed       Past Medical History:  Diagnosis Date  . AIDS ([place]) 12/07/2015  . Emphysema of lung ([place]) 01/02/2017  . Rash and nonspecific skin eruption 12/07/2015  . Smoker 01/02/2017  . Thrush 12/07/2015  . Zoster 12/28/2018    Past Surgical History:  Procedure Laterality Date  . LAPAROSCOPY N/A 10/22/2019   Procedure: LAPAROSCOPY DIAGNOSTIC;  Surgeon: Jesusita Oka, MD;  Location: [facility];  Service: General;  Laterality: N/A;  . LAPAROTOMY N/A 10/22/2019   Procedure: EXPLORATORY LAPAROTOMY;  Surgeon: Jesusita Oka, MD;  Location: [facility];  Service: General;  Laterality: N/A;  . SMALL BOWEL REPAIR N/A 10/22/2019   Procedure: Small Bowel Repair;  Surgeon: Jesusita Oka, MD;  Location: [facility];  Service: General;  Laterality: N/A;    There were no vitals filed for this visit.  Subjective Assessment - 02/16/20 0950    Subjective  Patient reports he has not been having much pain. he felt like after the last session he has been much looser.    Limitations  Lifting;House hold activities    Patient Stated Goals  To return to using LT arm for normal self care and home and work tasks.    Currently in Pain?  No/denies         [facility] PT Assessment - 02/16/20 0001      PROM   Right Shoulder External  Rotation  35 Degrees                    [facility] Adult PT Treatment/Exercise - 02/16/20 0001      Shoulder Exercises: Pulleys   Other Pulley Exercises  2 min flexion       Vasopneumatic   Number Minutes Vasopneumatic   10 minutes    Vasopnuematic Location   Shoulder    Vasopneumatic Pressure  Medium    Vasopneumatic Temperature   34      Manual Therapy   Manual Therapy  Passive ROM;Joint mobilization    Joint Mobilization  Grade II-III GHJ to reduce guarding    Passive ROM  Flexion, abduction, ER to tolerance 10 sec hol x10 each             PT Education - 02/16/20 1041    Education Details  reviewed technique with pulleys    Person(s) Educated  Patient    Methods  Explanation;Demonstration;Tactile cues;Verbal cues    Comprehension  Verbalized understanding;Returned demonstration;Verbal cues required;Tactile cues required       PT Short Term Goals - 01/27/20 1347      PT SHORT TERM GOAL #1   Title  He will be independent with initial HEP  Time  4    Period  Weeks    Status  New      PT SHORT TERM GOAL #2   Title  safely progress  into 4-6 weeks of protocol.    Time  4    Period  Weeks    Status  New      PT SHORT TERM GOAL #3   Title  No increased pain    Time  4    Period  Weeks    Status  New        PT Long Term Goals - 01/27/20 1348      PT LONG TERM GOAL #1   Title  He will be indpendent with all hEP    Time  16    Period  Weeks    Status  New      PT LONG TERM GOAL #2   Title  He will be able to lift  25# or higher with 1-2 max pain to be able to return to work.    Time  16    Period  Weeks    Status  New      PT LONG TERM GOAL #3   Title  He will have fiull ROM to be able to RTW safely    Time  16    Period  Weeks    Status  New      PT LONG TERM GOAL #4   Title  He wil have pain max 1-2/10 with all normal home tasks    Time  16    Period  Weeks    Status  New      PT LONG TERM GOAL #5   Title  Normal self care  without compensation no pain    Time  16    Period  Weeks    Status  New      Additional Long Term Goals   Additional Long Term Goals  Yes      PT LONG TERM GOAL #6   Title  FOTO score will improve to 31% limited from 68% limited    Time  16    Period  Weeks    Status  New            Plan - 02/16/20 1043    Clinical Impression Statement  POatients passive ER improved significantly. He was measured at 35 degrees today. Patient was able to intiate pulleys. He can do more per protocol but he was limited by time today. therayp talked to him about being on time so we could maximize our tratemnts.    Examination-Activity Limitations  Reach Overhead;Hygiene/Grooming;Bathing;Lift    Stability/Clinical Decision Making  Stable/Uncomplicated    Clinical Decision Making  Low    Rehab Potential  Good    PT Frequency  2x / week    PT Duration  12 weeks    PT Treatment/Interventions  Passive range of motion;Manual techniques;Patient/family education;Therapeutic exercise;Cryotherapy;Vasopneumatic Device;Electrical Stimulation    PT Next Visit Plan  manual. for ROM within protocol limits, manual and modalites for pain.  Review HEP       Patient will benefit from skilled therapeutic intervention in order to improve the following deficits and impairments:  Pain, Impaired UE functional use, Decreased activity tolerance, Decreased range of motion, Increased muscle spasms, Decreased strength  Visit Diagnosis: Acute pain of left shoulder  Stiffness of left shoulder, not elsewhere classified  Muscle weakness (generalized)  Cramp and spasm  Status post  left rotator cuff repair  Localized edema     Problem List Patient Active Problem List   Diagnosis Date Noted  . Contusion 10/22/2019  . Zoster 12/28/2018  . Emphysema of lung ([facility]) 01/02/2017  . Smoker 01/02/2017  . Rash and nonspecific skin eruption 12/07/2015  . HIV disease ([facility]) 03/06/2010    Dessie Coma PT DPT  02/16/2020,  1:27 PM  [facility] [address] [place], [place], [address] Phone: [phone]   Fax:  [phone]  Name: Jeremy Harper MRN: [id number] Date of Birth: June 27, 1981

## 2020-02-21 ENCOUNTER — Ambulatory Visit: Payer: 59 | Admitting: Physical Therapy

## 2020-02-21 ENCOUNTER — Other Ambulatory Visit: Payer: Self-pay

## 2020-02-21 ENCOUNTER — Encounter: Payer: Self-pay | Admitting: Physical Therapy

## 2020-02-21 DIAGNOSIS — Z9889 Other specified postprocedural states: Secondary | ICD-10-CM

## 2020-02-21 DIAGNOSIS — M6281 Muscle weakness (generalized): Secondary | ICD-10-CM | POA: Diagnosis not present

## 2020-02-21 DIAGNOSIS — M25512 Pain in left shoulder: Secondary | ICD-10-CM | POA: Diagnosis not present

## 2020-02-21 DIAGNOSIS — R600 Localized edema: Secondary | ICD-10-CM

## 2020-02-21 DIAGNOSIS — R252 Cramp and spasm: Secondary | ICD-10-CM

## 2020-02-21 DIAGNOSIS — M25612 Stiffness of left shoulder, not elsewhere classified: Secondary | ICD-10-CM

## 2020-02-21 NOTE — Therapy (Signed)
[facility] Outpatient Rehabilitation [facility] [address] [place], [place], [address] Phone: [phone]   Fax:  [phone]  Physical Therapy Treatment  Patient Details  Name: Jeremy Harper MRN: [id number] Date of Birth: 19-Aug-1981 Referring Provider (PT): Valma Cava, MD   Encounter Date: 02/21/2020   PT End of Session - 02/21/20 1012    Visit Number 8    Number of Visits 31    Date for PT Re-Evaluation 05/12/20    Authorization Type UMR    PT Start Time 0934    PT Stop Time 1015    PT Time Calculation (min) 41 min    Activity Tolerance Patient tolerated treatment well    Behavior During Therapy [facility] for tasks assessed/performed           Past Medical History:  Diagnosis Date  . AIDS ([facility]) 12/07/2015  . Emphysema of lung ([facility]) 01/02/2017  . Rash and nonspecific skin eruption 12/07/2015  . Smoker 01/02/2017  . Thrush 12/07/2015  . Zoster 12/28/2018    Past Surgical History:  Procedure Laterality Date  . LAPAROSCOPY N/A 10/22/2019   Procedure: LAPAROSCOPY DIAGNOSTIC;  Surgeon: Diamantina Monks, MD;  [facility];  Service: General;  Laterality: N/A;  . LAPAROTOMY N/A 10/22/2019   Procedure: EXPLORATORY LAPAROTOMY;  Surgeon: Diamantina Monks, MD;  [facility];  Service: General;  Laterality: N/A;  . SMALL BOWEL REPAIR N/A 10/22/2019   Procedure: Small Bowel Repair;  Surgeon: Diamantina Monks, MD;  [facility];  Service: General;  Laterality: N/A;    There were no vitals filed for this visit.   Subjective Assessment - 02/21/20 1005    Subjective Patient has no complaints. H ehas been working on his exeercises.    Limitations Lifting;House hold activities    Patient Stated Goals To return to using LT arm for normal self care and home and work tasks.    Currently in Pain? No/denies   intermittent pain at times nut nothing this morning                            [facility] Adult PT Treatment/Exercise - 02/21/20 0001       Shoulder Exercises: Supine   Other Supine Exercises wan dflexion 2x5 with cuing for technique. Cehst press to start       Shoulder Exercises: Standing   Extension Limitations x10 yellow     Row Limitations x10 yellow       Shoulder Exercises: Pulleys   Other Pulley Exercises 2 min flexion       Modalities   Modalities Cryotherapy      Cryotherapy   Number Minutes Cryotherapy 10 Minutes   vaso not available    Cryotherapy Location Shoulder    Type of Cryotherapy Ice pack      Manual Therapy   Manual Therapy Passive ROM;Joint mobilization    Joint Mobilization Grade II-III GHJ to reduce guarding    Passive ROM Flexion, abduction, ER to tolerance 10 sec hol x10 each                  PT Education - 02/21/20 1009    Education Details reviewed scpa strengthening    Person(s) Educated Patient    Methods Explanation;Demonstration;Verbal cues;Tactile cues    Comprehension Verbalized understanding;Returned demonstration;Tactile cues required;Verbal cues required            PT Short Term Goals - 01/27/20 1347  PT SHORT TERM GOAL #1   Title He will be independent with initial HEP    Time 4    Period Weeks    Status New      PT SHORT TERM GOAL #2   Title safely progress  into 4-6 weeks of protocol.    Time 4    Period Weeks    Status New      PT SHORT TERM GOAL #3   Title No increased pain    Time 4    Period Weeks    Status New             PT Long Term Goals - 01/27/20 1348      PT LONG TERM GOAL #1   Title He will be indpendent with all hEP    Time 16    Period Weeks    Status New      PT LONG TERM GOAL #2   Title He will be able to lift  25# or higher with 1-2 max pain to be able to return to work.    Time 16    Period Weeks    Status New      PT LONG TERM GOAL #3   Title He will have fiull ROM to be able to RTW safely    Time 16    Period Weeks    Status New      PT LONG TERM GOAL #4   Title He wil have pain max 1-2/10 with all  normal home tasks    Time 16    Period Weeks    Status New      PT LONG TERM GOAL #5   Title Normal self care without compensation no pain    Time 16    Period Weeks    Status New      Additional Long Term Goals   Additional Long Term Goals Yes      PT LONG TERM GOAL #6   Title FOTO score will improve to 31% limited from 68% limited    Time 16    Period Weeks    Status New                 Plan - 02/21/20 1207    Clinical Impression Statement Patients passive ER has improved signifcantly. He was measured at 540 degrees today. He was given AAROM for flexion for his home program. He    Examination-Activity Limitations Reach Overhead;Hygiene/Grooming;Bathing;Lift    Stability/Clinical Decision Making Stable/Uncomplicated    Clinical Decision Making Low    Rehab Potential Good    PT Frequency 2x / week    PT Duration 12 weeks    PT Treatment/Interventions Passive range of motion;Manual techniques;Patient/family education;Therapeutic exercise;Cryotherapy;Vasopneumatic Device;Electrical Stimulation    PT Next Visit Plan manual. for ROM within protocol limits, manual and modalites for pain.  Review HEP    PT Home Exercise Plan pendulum, scap AROM,  elbow AROM, gripping, supine shoulder ER PROM with dowel, seated table slide flexion PROM    Consulted and Agree with Plan of Care Patient           Patient will benefit from skilled therapeutic intervention in order to improve the following deficits and impairments:  Pain, Impaired UE functional use, Decreased activity tolerance, Decreased range of motion, Increased muscle spasms, Decreased strength  Visit Diagnosis: Acute pain of left shoulder  Stiffness of left shoulder, not elsewhere classified  Muscle weakness (generalized)  Cramp and spasm  Status post left rotator cuff repair  Localized edema     Problem List Patient Active Problem List   Diagnosis Date Noted  . Contusion 10/22/2019  . Zoster 12/28/2018   . Emphysema of lung ([place]) 01/02/2017  . Smoker 01/02/2017  . Rash and nonspecific skin eruption 12/07/2015  . HIV disease ([place]) 03/06/2010    Carney Living PT DPT  02/21/2020, 2:55 PM  [facility] [address] [place], [place], [address] Phone: [phone]   Fax:  [phone]  Name: Jeremy Harper MRN: [id number] Date of Birth: 1980-11-07

## 2020-02-23 ENCOUNTER — Encounter: Payer: Self-pay | Admitting: Physical Therapy

## 2020-02-23 ENCOUNTER — Other Ambulatory Visit: Payer: Self-pay

## 2020-02-23 ENCOUNTER — Ambulatory Visit: Payer: 59 | Admitting: Physical Therapy

## 2020-02-23 DIAGNOSIS — Z9889 Other specified postprocedural states: Secondary | ICD-10-CM | POA: Diagnosis not present

## 2020-02-23 DIAGNOSIS — M25612 Stiffness of left shoulder, not elsewhere classified: Secondary | ICD-10-CM

## 2020-02-23 DIAGNOSIS — R600 Localized edema: Secondary | ICD-10-CM | POA: Diagnosis not present

## 2020-02-23 DIAGNOSIS — M6281 Muscle weakness (generalized): Secondary | ICD-10-CM

## 2020-02-23 DIAGNOSIS — M25512 Pain in left shoulder: Secondary | ICD-10-CM | POA: Diagnosis not present

## 2020-02-23 DIAGNOSIS — R252 Cramp and spasm: Secondary | ICD-10-CM

## 2020-02-23 NOTE — Therapy (Signed)
[facility] Outpatient Rehabilitation [facility] [address] [place], [place], [address] Phone: [phone]   Fax:  [phone]  Physical Therapy Treatment  Patient Details  Name: Jeremy Harper MRN: [id number] Date of Birth: 01-01-1981 Referring Provider (PT): Valma Cava, MD   Encounter Date: 02/23/2020   PT End of Session - 02/23/20 0945    Visit Number 9    Number of Visits 31    Date for PT Re-Evaluation 05/12/20    Authorization Type UMR    PT Start Time 0921    PT Stop Time 1015   vaso not included in billed minutes   PT Time Calculation (min) 54 min    Activity Tolerance Patient tolerated treatment well    Behavior During Therapy [facility] for tasks assessed/performed           Past Medical History:  Diagnosis Date   AIDS ([facility]) 12/07/2015   Emphysema of lung ([facility]) 01/02/2017   Rash and nonspecific skin eruption 12/07/2015   Smoker 01/02/2017   Thrush 12/07/2015   Zoster 12/28/2018    Past Surgical History:  Procedure Laterality Date   LAPAROSCOPY N/A 10/22/2019   Procedure: LAPAROSCOPY DIAGNOSTIC;  Surgeon: Diamantina Monks, MD;  [facility];  Service: General;  Laterality: N/A;   LAPAROTOMY N/A 10/22/2019   Procedure: EXPLORATORY LAPAROTOMY;  Surgeon: Diamantina Monks, MD;  [facility];  Service: General;  Laterality: N/A;   SMALL BOWEL REPAIR N/A 10/22/2019   Procedure: Small Bowel Repair;  Surgeon: Diamantina Monks, MD;  [facility];  Service: General;  Laterality: N/A;    There were no vitals filed for this visit.   Subjective Assessment - 02/23/20 0925    Subjective Patient reports he is feeling ok, just a little pain this morning.    Patient Stated Goals To return to using LT arm for normal self care and home and work tasks.    Currently in Pain? Yes    Pain Score 1     Pain Location Shoulder    Pain Orientation Left    Pain Descriptors / Indicators Aching    Pain Type Surgical pain    Pain Onset More than a month ago     Pain Frequency Intermittent              [facility] PT Assessment - 02/23/20 0001      PROM   Right Shoulder Flexion 120 Degrees    Right Shoulder External Rotation 45 Degrees                         [facility] Adult PT Treatment/Exercise - 02/23/20 0001      Exercises   Exercises Shoulder      Shoulder Exercises: Supine   Flexion AAROM;10 reps   2 sets   Flexion Limitations 1st set: chest press with dowel, 2nd set with overhead pull      Shoulder Exercises: Standing   Flexion AAROM;10 reps    Flexion Limitations ball on table forward roll out      Shoulder Exercises: Pulleys   Flexion 2 minutes      Modalities   Modalities Vasopneumatic   not included in billed treatment time     Vasopneumatic   Number Minutes Vasopneumatic  15 minutes    Vasopnuematic Location  Shoulder    Vasopneumatic Pressure Medium    Vasopneumatic Temperature  34      Manual Therapy   Manual Therapy Passive  ROM;Joint mobilization    Joint Mobilization Gentle GHJ mobs tor educe guarding    Passive ROM Flexion, abduction, ER to tolerance 10 sec hold x10 each                  PT Education - 02/23/20 0944    Education Details HEP, encouraged to continue working on [place] at home    Person(s) Educated Patient    Methods Explanation;Demonstration;Verbal cues    Comprehension Verbalized understanding;Returned demonstration;Verbal cues required;Need further instruction            PT Short Term Goals - 01/27/20 1347      PT SHORT TERM GOAL #1   Title He will be independent with initial HEP    Time 4    Period Weeks    Status New      PT SHORT TERM GOAL #2   Title safely progress  into 4-6 weeks of protocol.    Time 4    Period Weeks    Status New      PT SHORT TERM GOAL #3   Title No increased pain    Time 4    Period Weeks    Status New             PT Long Term Goals - 01/27/20 1348      PT LONG TERM GOAL #1   Title He will be indpendent with  all hEP    Time 16    Period Weeks    Status New      PT LONG TERM GOAL #2   Title He will be able to lift  25# or higher with 1-2 max pain to be able to return to work.    Time 16    Period Weeks    Status New      PT LONG TERM GOAL #3   Title He will have fiull ROM to be able to RTW safely    Time 16    Period Weeks    Status New      PT LONG TERM GOAL #4   Title He wil have pain max 1-2/10 with all normal home tasks    Time 16    Period Weeks    Status New      PT LONG TERM GOAL #5   Title Normal self care without compensation no pain    Time 16    Period Weeks    Status New      Additional Long Term Goals   Additional Long Term Goals Yes      PT LONG TERM GOAL #6   Title FOTO score will improve to 31% limited from 68% limited    Time 16    Period Weeks    Status New                 Plan - 02/23/20 0945    Clinical Impression Statement Patient tolerated therapy well with no adverse effects. He is progressing well with his PROM and tolerating AAROM exercises well for flexion. He continues to be guarded with passive motion and he was instructed to avoid any shoulder shrug with AAROM exercises. He was reinstructed on proper form for HEP and encouraged to continue working on PROM at home. He would benefit from continued skilled PT to progress motion and strength as protocol allows.    PT Treatment/Interventions Passive range of motion;Manual techniques;Patient/family education;Therapeutic exercise;Cryotherapy;Vasopneumatic Device;Electrical Stimulation    PT Next Visit Plan manual  for ROM and pain within protocol limits, progress AAROM as tolerated, scap stabilization, review and progress HEP    PT Home Exercise Plan pendulum, scap AROM, elbow AROM, gripping, supine shoulder ER PROM with dowel, seated table slide flexion PROM, supine AAROM flexion with dowel    Consulted and Agree with Plan of Care Patient           Patient will benefit from skilled  therapeutic intervention in order to improve the following deficits and impairments:  Pain, Impaired UE functional use, Decreased activity tolerance, Decreased range of motion, Increased muscle spasms, Decreased strength  Visit Diagnosis: Acute pain of left shoulder  Stiffness of left shoulder, not elsewhere classified  Muscle weakness (generalized)  Cramp and spasm  Status post left rotator cuff repair     Problem List Patient Active Problem List   Diagnosis Date Noted   Contusion 10/22/2019   Zoster 12/28/2018   Emphysema of lung ([facility]) 01/02/2017   Smoker 01/02/2017   Rash and nonspecific skin eruption 12/07/2015   HIV disease ([facility]) 03/06/2010    Rosana Hoes, PT, DPT, LAT, ATC 02/23/20  10:02 AM Phone: [phone] Fax: [phone]   [facility] Outpatient Rehabilitation [facility] [address] [place], [place], [address] Phone: [phone]   Fax:  [phone]  Name: NELTON AMSDEN MRN: [id number] Date of Birth: [birth year]

## 2020-02-28 ENCOUNTER — Encounter: Payer: Self-pay | Admitting: Physical Therapy

## 2020-02-28 ENCOUNTER — Ambulatory Visit: Payer: 59 | Admitting: Physical Therapy

## 2020-02-28 ENCOUNTER — Other Ambulatory Visit: Payer: Self-pay

## 2020-02-28 DIAGNOSIS — M25512 Pain in left shoulder: Secondary | ICD-10-CM

## 2020-02-28 DIAGNOSIS — R252 Cramp and spasm: Secondary | ICD-10-CM | POA: Diagnosis not present

## 2020-02-28 DIAGNOSIS — R600 Localized edema: Secondary | ICD-10-CM | POA: Diagnosis not present

## 2020-02-28 DIAGNOSIS — Z9889 Other specified postprocedural states: Secondary | ICD-10-CM | POA: Diagnosis not present

## 2020-02-28 DIAGNOSIS — M25612 Stiffness of left shoulder, not elsewhere classified: Secondary | ICD-10-CM | POA: Diagnosis not present

## 2020-02-28 DIAGNOSIS — M6281 Muscle weakness (generalized): Secondary | ICD-10-CM

## 2020-02-28 NOTE — Therapy (Signed)
[facility] [address] [place], [place], [address] Phone: [phone]   Fax:  [phone]  Physical Therapy Treatment  Patient Details  Name: Jeremy Harper MRN: [id number] Date of Birth: Apr 22, 1981 Referring Provider (PT): Valma Cava, MD   Encounter Date: 02/28/2020   PT End of Session - 02/28/20 0937    Visit Number 10    Number of Visits 31    Date for PT Re-Evaluation 05/12/20    Authorization Type UMR    PT Start Time 0931    PT Stop Time 1012    PT Time Calculation (min) 41 min    Activity Tolerance Patient tolerated treatment well    Behavior During Therapy [facility] for tasks assessed/performed           Past Medical History:  Diagnosis Date  . AIDS (HCC) 12/07/2015  . Emphysema of lung (HCC) 01/02/2017  . Rash and nonspecific skin eruption 12/07/2015  . Smoker 01/02/2017  . Thrush 12/07/2015  . Zoster 12/28/2018    Past Surgical History:  Procedure Laterality Date  . LAPAROSCOPY N/A 10/22/2019   Procedure: LAPAROSCOPY DIAGNOSTIC;  Surgeon: Diamantina Monks, MD;  Location: [facility] OR;  Service: General;  Laterality: N/A;  . LAPAROTOMY N/A 10/22/2019   Procedure: EXPLORATORY LAPAROTOMY;  Surgeon: Diamantina Monks, MD;  Location: [facility] OR;  Service: General;  Laterality: N/A;  . SMALL BOWEL REPAIR N/A 10/22/2019   Procedure: Small Bowel Repair;  Surgeon: Diamantina Monks, MD;  Location: [facility] OR;  Service: General;  Laterality: N/A;    There were no vitals filed for this visit.   Subjective Assessment - 02/28/20 0934    Subjective Patient continues to have no complaints. he is not having pain today. he got a little sore after the last visit.    Limitations Lifting;House hold activities    Patient Stated Goals To return to using LT arm for normal self care and home and work tasks.    Currently in Pain? No/denies    Pain Location Shoulder    Pain Orientation Left    Pain Descriptors / Indicators Aching    Pain Type  Chronic pain    Pain Onset More than a month ago    Pain Frequency Intermittent    Aggravating Factors  moving the arm    Pain Relieving Factors rest and meds              [facility] PT Assessment - 02/28/20 0001      PROM   Right Shoulder External Rotation 47 Degrees                         [facility] Adult PT Treatment/Exercise - 02/28/20 0001      Shoulder Exercises: Supine   Flexion AAROM;15 reps   2 sets     Shoulder Exercises: Standing   Extension Limitations 2x10 yellow     Row Limitations 2x10 yellow       Vasopneumatic   Number Minutes Vasopneumatic  15 minutes    Vasopnuematic Location  Shoulder    Vasopneumatic Pressure Medium    Vasopneumatic Temperature  34      Manual Therapy   Manual Therapy Passive ROM;Joint mobilization    Joint Mobilization Gentle GHJ mobs tor educe guarding    Passive ROM Flexion, abduction, ER to tolerance 10 sec hold x10 each  PT Education - 02/28/20 0936    Education Details reviewed weening out of the sling    Person(s) Educated Patient    Methods Explanation;Demonstration;Tactile cues;Verbal cues    Comprehension Verbalized understanding;Returned demonstration;Verbal cues required;Tactile cues required            PT Short Term Goals - 01/27/20 1347      PT SHORT TERM GOAL #1   Title He will be independent with initial HEP    Time 4    Period Weeks    Status New      PT SHORT TERM GOAL #2   Title safely progress  into 4-6 weeks of protocol.    Time 4    Period Weeks    Status New      PT SHORT TERM GOAL #3   Title No increased pain    Time 4    Period Weeks    Status New             PT Long Term Goals - 01/27/20 1348      PT LONG TERM GOAL #1   Title He will be indpendent with all hEP    Time 16    Period Weeks    Status New      PT LONG TERM GOAL #2   Title He will be able to lift  25# or higher with 1-2 max pain to be able to return to work.    Time 16    Period  Weeks    Status New      PT LONG TERM GOAL #3   Title He will have fiull ROM to be able to RTW safely    Time 16    Period Weeks    Status New      PT LONG TERM GOAL #4   Title He wil have pain max 1-2/10 with all normal home tasks    Time 16    Period Weeks    Status New      PT LONG TERM GOAL #5   Title Normal self care without compensation no pain    Time 16    Period Weeks    Status New      Additional Long Term Goals   Additional Long Term Goals Yes      PT LONG TERM GOAL #6   Title FOTO score will improve to 31% limited from 68% limited    Time 16    Period Weeks    Status New                 Plan - 02/28/20 1005    Clinical Impression Statement Patient is making good progress. his ER was 47 degrees today. He tolerated ther-ex well. he was encouraged to continue with passive ER stretching at home. Therapy will continue to advance ther-ex per protocol.Therapy added side lying ER today. he tolerated well.    Examination-Activity Limitations Reach Overhead;Hygiene/Grooming;Bathing;Lift    Examination-Participation Restrictions Cleaning;Community Activity;Driving;Yard Work    Stability/Clinical Decision Making Stable/Uncomplicated    Clinical Decision Making Low    Rehab Potential Good    PT Frequency 2x / week    PT Treatment/Interventions Passive range of motion;Manual techniques;Patient/family education;Therapeutic exercise;Cryotherapy;Vasopneumatic Device;Electrical Stimulation    PT Next Visit Plan manual for ROM and pain within protocol limits, progress AAROM as tolerated, scap stabilization, review and progress HEP    PT Home Exercise Plan pendulum, scap AROM, elbow AROM, gripping, supine shoulder ER PROM with dowel,  seated table slide flexion PROM, supine AAROM flexion with dowel    Consulted and Agree with Plan of Care Patient           Patient will benefit from skilled therapeutic intervention in order to improve the following deficits and  impairments:  Pain, Impaired UE functional use, Decreased activity tolerance, Decreased range of motion, Increased muscle spasms, Decreased strength  Visit Diagnosis: Acute pain of left shoulder  Stiffness of left shoulder, not elsewhere classified  Cramp and spasm  Muscle weakness (generalized)     Problem List Patient Active Problem List   Diagnosis Date Noted  . Contusion 10/22/2019  . Zoster 12/28/2018  . Emphysema of lung ([place]) 01/02/2017  . Smoker 01/02/2017  . Rash and nonspecific skin eruption 12/07/2015  . HIV disease ([place]) 03/06/2010    Carney Living PT DPT  02/28/2020, 12:39 PM  [facility] [address] [place], [place], [address] Phone: [phone]   Fax:  [phone]  Name: Jeremy Harper MRN: [id number] Date of Birth: 1980/09/14

## 2020-03-01 ENCOUNTER — Ambulatory Visit: Payer: 59 | Admitting: Physical Therapy

## 2020-03-02 ENCOUNTER — Other Ambulatory Visit: Payer: Self-pay

## 2020-03-02 ENCOUNTER — Ambulatory Visit: Payer: 59 | Admitting: Physical Therapy

## 2020-03-02 ENCOUNTER — Encounter: Payer: Self-pay | Admitting: Physical Therapy

## 2020-03-02 ENCOUNTER — Other Ambulatory Visit: Payer: Self-pay | Admitting: Infectious Disease

## 2020-03-02 DIAGNOSIS — R600 Localized edema: Secondary | ICD-10-CM | POA: Diagnosis not present

## 2020-03-02 DIAGNOSIS — M25512 Pain in left shoulder: Secondary | ICD-10-CM | POA: Diagnosis not present

## 2020-03-02 DIAGNOSIS — M6281 Muscle weakness (generalized): Secondary | ICD-10-CM | POA: Diagnosis not present

## 2020-03-02 DIAGNOSIS — M25612 Stiffness of left shoulder, not elsewhere classified: Secondary | ICD-10-CM

## 2020-03-02 DIAGNOSIS — Z9889 Other specified postprocedural states: Secondary | ICD-10-CM

## 2020-03-02 DIAGNOSIS — R252 Cramp and spasm: Secondary | ICD-10-CM

## 2020-03-02 DIAGNOSIS — B20 Human immunodeficiency virus [HIV] disease: Secondary | ICD-10-CM

## 2020-03-02 NOTE — Therapy (Signed)
[facility] [address] [place], [place], [address] Phone: [phone]   Fax:  [phone]  Physical Therapy Treatment  Patient Details  Name: Jeremy Harper MRN: [id number] Date of Birth: [birth year] Referring Provider (PT): Valma Cava, MD   Encounter Date: 03/02/2020   PT End of Session - 03/02/20 1201    Visit Number 11    Number of Visits 31    Date for PT Re-Evaluation 05/12/20    Authorization Type UMR    PT Start Time 1155   10 minutes late   PT Stop Time 1245    PT Time Calculation (min) 50 min           Past Medical History:  Diagnosis Date  . AIDS (HCC) 12/07/2015  . Emphysema of lung (HCC) 01/02/2017  . Rash and nonspecific skin eruption 12/07/2015  . Smoker 01/02/2017  . Thrush 12/07/2015  . Zoster 12/28/2018    Past Surgical History:  Procedure Laterality Date  . LAPAROSCOPY N/A 10/22/2019   Procedure: LAPAROSCOPY DIAGNOSTIC;  Surgeon: Diamantina Monks, MD;  Location: [facility] OR;  Service: General;  Laterality: N/A;  . LAPAROTOMY N/A 10/22/2019   Procedure: EXPLORATORY LAPAROTOMY;  Surgeon: Diamantina Monks, MD;  Location: [facility] OR;  Service: General;  Laterality: N/A;  . SMALL BOWEL REPAIR N/A 10/22/2019   Procedure: Small Bowel Repair;  Surgeon: Diamantina Monks, MD;  Location: [facility] OR;  Service: General;  Laterality: N/A;    There were no vitals filed for this visit.   Subjective Assessment - 03/02/20 1157    Subjective Saw MD yesterday. Probably cannot go back to work in august without restrictions.    Currently in Pain? No/denies              [facility] PT Assessment - 03/02/20 0001      PROM   Right Shoulder Flexion 125 Degrees    Right Shoulder External Rotation 40 Degrees                         [facility] Adult PT Treatment/Exercise - 03/02/20 0001      Shoulder Exercises: Seated   External Rotation PROM    External Rotation Limitations single arm doorway at neutral 10 sec x 5      Flexion PROM;5 reps   10 sec hold   Flexion Limitations Table slides       Shoulder Exercises: Standing   External Rotation AAROM    External Rotation Limitations with dowel/ranger    Extension 20 reps    Theraband Level (Shoulder Extension) Level 2 (Red)    Row 20 reps    Theraband Level (Shoulder Row) Level 2 (Red)      Shoulder Exercises: Pulleys   Flexion 2 minutes      Shoulder Exercises: ROM/Strengthening   Other ROM/Strengthening Exercises Standing UE ranger flexion to approx100 degrees      Vasopneumatic   Number Minutes Vasopneumatic  15 minutes    Vasopnuematic Location  Shoulder    Vasopneumatic Pressure Medium    Vasopneumatic Temperature  34      Manual Therapy   Manual Therapy Passive ROM;Joint mobilization    Joint Mobilization Gentle GHJ mobs tor educe guarding    Passive ROM Flexion, abduction, ER to tolerance 10 sec hold x10 each                    PT Short Term Goals - 01/27/20 1347  PT SHORT TERM GOAL #1   Title He will be independent with initial HEP    Time 4    Period Weeks    Status New      PT SHORT TERM GOAL #2   Title safely progress  into 4-6 weeks of protocol.    Time 4    Period Weeks    Status New      PT SHORT TERM GOAL #3   Title No increased pain    Time 4    Period Weeks    Status New             PT Long Term Goals - 01/27/20 1348      PT LONG TERM GOAL #1   Title He will be indpendent with all hEP    Time 16    Period Weeks    Status New      PT LONG TERM GOAL #2   Title He will be able to lift  25# or higher with 1-2 max pain to be able to return to work.    Time 16    Period Weeks    Status New      PT LONG TERM GOAL #3   Title He will have fiull ROM to be able to RTW safely    Time 16    Period Weeks    Status New      PT LONG TERM GOAL #4   Title He wil have pain max 1-2/10 with all normal home tasks    Time 16    Period Weeks    Status New      PT LONG TERM GOAL #5   Title Normal  self care without compensation no pain    Time 16    Period Weeks    Status New      Additional Long Term Goals   Additional Long Term Goals Yes      PT LONG TERM GOAL #6   Title FOTO score will improve to 31% limited from 68% limited    Time 16    Period Weeks    Status New                 Plan - 03/02/20 1251    Clinical Impression Statement Increased tightness with ER ROM today. Flexion slightly improved. Reviewed table slides for Passive flexion stretch and began neutral doorway stretching for ER. Vaso at end of session. Pt reported more soreness than usual after session today. Recommended he use ice again alter today.    PT Next Visit Plan NEEDS FOTO STATUS ;manual for ROM and pain within protocol limits, progress AAROM as tolerated, scap stabilization, review and progress HEP    PT Home Exercise Plan pendulum, scap AROM, elbow AROM, gripping, supine shoulder ER PROM with dowel, seated table slide flexion PROM, supine AAROM flexion with dowel           Patient will benefit from skilled therapeutic intervention in order to improve the following deficits and impairments:  Pain, Impaired UE functional use, Decreased activity tolerance, Decreased range of motion, Increased muscle spasms, Decreased strength  Visit Diagnosis: Acute pain of left shoulder  Stiffness of left shoulder, not elsewhere classified  Cramp and spasm  Muscle weakness (generalized)  Status post left rotator cuff repair  Localized edema     Problem List Patient Active Problem List   Diagnosis Date Noted  . Contusion 10/22/2019  . Zoster 12/28/2018  . Emphysema of  lung (HCC) 01/02/2017  . Smoker 01/02/2017  . Rash and nonspecific skin eruption 12/07/2015  . HIV disease (HCC) 03/06/2010    Jeremy Harper, Jeremy Harper 03/02/2020, 12:57 PM  [facility] [address] [place], [place], [address] Phone: [phone]   Fax:   [phone]  Name: Jeremy Harper MRN: [id number] Date of Birth: December 31, 1980

## 2020-03-06 ENCOUNTER — Encounter: Payer: Self-pay | Admitting: Physical Therapy

## 2020-03-06 ENCOUNTER — Ambulatory Visit: Payer: 59 | Admitting: Physical Therapy

## 2020-03-06 ENCOUNTER — Other Ambulatory Visit: Payer: Self-pay

## 2020-03-06 DIAGNOSIS — R252 Cramp and spasm: Secondary | ICD-10-CM

## 2020-03-06 DIAGNOSIS — M25512 Pain in left shoulder: Secondary | ICD-10-CM | POA: Diagnosis not present

## 2020-03-06 DIAGNOSIS — Z9889 Other specified postprocedural states: Secondary | ICD-10-CM

## 2020-03-06 DIAGNOSIS — M6281 Muscle weakness (generalized): Secondary | ICD-10-CM | POA: Diagnosis not present

## 2020-03-06 DIAGNOSIS — M25612 Stiffness of left shoulder, not elsewhere classified: Secondary | ICD-10-CM

## 2020-03-06 DIAGNOSIS — R600 Localized edema: Secondary | ICD-10-CM | POA: Diagnosis not present

## 2020-03-06 MED FILL — TIVICAY 50 MG TABLET: 50 | 30 days supply | Qty: 30 | Fill #9

## 2020-03-06 MED FILL — DESCOVY 200-25 MG TABS: 200-25 | 30 days supply | Qty: 30 | Fill #0

## 2020-03-06 NOTE — Therapy (Signed)
[facility] [address] [place], [place], [address] Phone: [phone]   Fax:  [phone]  Physical Therapy Treatment  Patient Details  Name: Jeremy Harper MRN: [id number] Date of Birth: 01-Feb-1981 Referring Provider (PT): Valma Cava, MD   Encounter Date: 03/06/2020   PT End of Session - 03/06/20 0858    Visit Number 12    Number of Visits 31    Date for PT Re-Evaluation 05/12/20    PT Start Time 0853   pt arrived 8 minutes late   PT Stop Time 0933    PT Time Calculation (min) 40 min    Activity Tolerance Patient tolerated treatment well    Behavior During Therapy [facility] for tasks assessed/performed           Past Medical History:  Diagnosis Date   AIDS (HCC) 12/07/2015   Emphysema of lung (HCC) 01/02/2017   Rash and nonspecific skin eruption 12/07/2015   Smoker 01/02/2017   Thrush 12/07/2015   Zoster 12/28/2018    Past Surgical History:  Procedure Laterality Date   LAPAROSCOPY N/A 10/22/2019   Procedure: LAPAROSCOPY DIAGNOSTIC;  Surgeon: Diamantina Monks, MD;  Location: [facility] OR;  Service: General;  Laterality: N/A;   LAPAROTOMY N/A 10/22/2019   Procedure: EXPLORATORY LAPAROTOMY;  Surgeon: Diamantina Monks, MD;  Location: [facility] OR;  Service: General;  Laterality: N/A;   SMALL BOWEL REPAIR N/A 10/22/2019   Procedure: Small Bowel Repair;  Surgeon: Diamantina Monks, MD;  Location: [facility] OR;  Service: General;  Laterality: N/A;    There were no vitals filed for this visit.   Subjective Assessment - 03/06/20 0856    Subjective Patient is feeling good today, and states he is in no pain. Felt a little sore after last session.    Limitations Lifting;House hold activities    Currently in Pain? No/denies    Pain Score 0-No pain    Pain Location Shoulder    Pain Orientation Left    Pain Type Chronic pain    Pain Onset More than a month ago    Pain Frequency Intermittent                              [facility] Adult PT Treatment/Exercise - 03/06/20 0001      Shoulder Exercises: Supine   Flexion AAROM;15 reps   1 set   Flexion Limitations AROM 10 reps      Shoulder Exercises: Sidelying   External Rotation AROM;Left;10 reps      Shoulder Exercises: Standing   Extension 15 reps   red   Row 15 reps   red     Shoulder Exercises: Pulleys   Flexion 2 minutes    Scaption 2 minutes      Shoulder Exercises: Isometric Strengthening   External Rotation Limitations Standing Isometric ER x10      Vasopneumatic   Number Minutes Vasopneumatic  15 minutes    Vasopnuematic Location  Shoulder    Vasopneumatic Pressure Low    Vasopneumatic Temperature  34      Manual Therapy   Manual Therapy Passive ROM;Joint mobilization    Joint Mobilization Gentle GHJ mobs to reduce guarding; Posterior and inferior mobs    Soft tissue mobilization to upper trap    Passive ROM Flexion, abduction, ER to tolerance                   PT Education -  03/06/20 1116    Education Details encouraged to continue working on PROM and isometric ER at home    Person(s) Educated Patient    Methods Explanation;Demonstration;Tactile cues;Verbal cues;Handout    Comprehension Verbalized understanding;Returned demonstration;Verbal cues required;Tactile cues required            PT Short Term Goals - 01/27/20 1347      PT SHORT TERM GOAL #1   Title He will be independent with initial HEP    Time 4    Period Weeks    Status New      PT SHORT TERM GOAL #2   Title safely progress  into 4-6 weeks of protocol.    Time 4    Period Weeks    Status New      PT SHORT TERM GOAL #3   Title No increased pain    Time 4    Period Weeks    Status New             PT Long Term Goals - 01/27/20 1348      PT LONG TERM GOAL #1   Title He will be indpendent with all hEP    Time 16    Period Weeks    Status New      PT LONG TERM GOAL #2   Title He will be able to lift  25# or higher with 1-2 max pain to be able  to return to work.    Time 16    Period Weeks    Status New      PT LONG TERM GOAL #3   Title He will have fiull ROM to be able to RTW safely    Time 16    Period Weeks    Status New      PT LONG TERM GOAL #4   Title He wil have pain max 1-2/10 with all normal home tasks    Time 16    Period Weeks    Status New      PT LONG TERM GOAL #5   Title Normal self care without compensation no pain    Time 16    Period Weeks    Status New      Additional Long Term Goals   Additional Long Term Goals Yes      PT LONG TERM GOAL #6   Title FOTO score will improve to 31% limited from 68% limited    Time 16    Period Weeks    Status New                 Plan - 03/06/20 1120    Clinical Impression Statement Therapy worked on PROM for ER and flexion today. ER measured at 46 degrees. Patient is tolerating AAROM well and progressed to some AROM today for flexion. Isometric ER introduced to begin strengthening rotator cuff muscles. Therapy will continue to progress exercises as protocol allows.    Examination-Activity Limitations Reach Overhead;Hygiene/Grooming;Bathing;Lift    Examination-Participation Restrictions Cleaning;Community Activity;Driving;Yard Work    Stability/Clinical Decision Making Stable/Uncomplicated    Clinical Decision Making Low    Rehab Potential Good    PT Frequency 2x / week    PT Duration 12 weeks    PT Treatment/Interventions Passive range of motion;Manual techniques;Patient/family education;Therapeutic exercise;Cryotherapy;Vasopneumatic Device;Electrical Stimulation    PT Next Visit Plan NEEDS FOTO STATUS ;manual for ROM and pain within protocol limits, progress AAROM as tolerated, scap stabilization, review and progress HEP    PT  Home Exercise Plan pendulum, scap AROM, elbow AROM, gripping, supine shoulder ER PROM with dowel, seated table slide flexion PROM, supine AAROM flexion with dowel    Consulted and Agree with Plan of Care Patient            Patient will benefit from skilled therapeutic intervention in order to improve the following deficits and impairments:  Pain, Impaired UE functional use, Decreased activity tolerance, Decreased range of motion, Increased muscle spasms, Decreased strength  Visit Diagnosis: No diagnosis found.     Problem List Patient Active Problem List   Diagnosis Date Noted   Contusion 10/22/2019   Zoster 12/28/2018   Emphysema of lung (HCC) 01/02/2017   Smoker 01/02/2017   Rash and nonspecific skin eruption 12/07/2015   HIV disease (HCC) 03/06/2010    Beata Beason SPT  03/06/2020, 11:25 AM  [facility] [address] [place], [place], [address] Phone: [phone]   Fax:  [phone]  Name: Jeremy Harper MRN: [id number] Date of Birth: Apr 25, 1981

## 2020-03-08 ENCOUNTER — Ambulatory Visit: Payer: 59 | Admitting: Physical Therapy

## 2020-03-08 ENCOUNTER — Other Ambulatory Visit: Payer: Self-pay

## 2020-03-08 ENCOUNTER — Encounter: Payer: Self-pay | Admitting: Physical Therapy

## 2020-03-08 DIAGNOSIS — R600 Localized edema: Secondary | ICD-10-CM

## 2020-03-08 DIAGNOSIS — M6281 Muscle weakness (generalized): Secondary | ICD-10-CM | POA: Diagnosis not present

## 2020-03-08 DIAGNOSIS — Z9889 Other specified postprocedural states: Secondary | ICD-10-CM | POA: Diagnosis not present

## 2020-03-08 DIAGNOSIS — M25512 Pain in left shoulder: Secondary | ICD-10-CM | POA: Diagnosis not present

## 2020-03-08 DIAGNOSIS — M25612 Stiffness of left shoulder, not elsewhere classified: Secondary | ICD-10-CM

## 2020-03-08 DIAGNOSIS — R252 Cramp and spasm: Secondary | ICD-10-CM

## 2020-03-08 NOTE — Therapy (Signed)
[facility] Outpatient Rehabilitation [facility] [address] [place], [place], [address] Phone: [phone]   Fax:  [phone]  Physical Therapy Treatment  Patient Details  Name: Jeremy Harper MRN: [id number] Date of Birth: 04-09-1981 Referring Provider (PT): Valma Cava, MD   Encounter Date: 03/08/2020   PT End of Session - 03/08/20 1021    Visit Number 13    Number of Visits 31    Date for PT Re-Evaluation 05/12/20    PT Start Time 0846    PT Stop Time 0945    PT Time Calculation (min) 59 min    Activity Tolerance Patient tolerated treatment well    Behavior During Therapy [facility] for tasks assessed/performed           Past Medical History:  Diagnosis Date  . AIDS (HCC) 12/07/2015  . Emphysema of lung (HCC) 01/02/2017  . Rash and nonspecific skin eruption 12/07/2015  . Smoker 01/02/2017  . Thrush 12/07/2015  . Zoster 12/28/2018    Past Surgical History:  Procedure Laterality Date  . LAPAROSCOPY N/A 10/22/2019   Procedure: LAPAROSCOPY DIAGNOSTIC;  Surgeon: Diamantina Monks, MD;  Location: [facility] OR;  Service: General;  Laterality: N/A;  . LAPAROTOMY N/A 10/22/2019   Procedure: EXPLORATORY LAPAROTOMY;  Surgeon: Diamantina Monks, MD;  Location: [facility] OR;  Service: General;  Laterality: N/A;  . SMALL BOWEL REPAIR N/A 10/22/2019   Procedure: Small Bowel Repair;  Surgeon: Diamantina Monks, MD;  Location: [facility] OR;  Service: General;  Laterality: N/A;    There were no vitals filed for this visit.   Subjective Assessment - 03/08/20 0854    Subjective Patient is feeling a constant ache since last session. Occurs when he moves it and sometimes when he does not move it. Pt reports it seems a little easier to do certain tasks like reaching and putting his shirt on.    Limitations Lifting;House hold activities    Patient Stated Goals To return to using LT arm for normal self care and home and work tasks.    Currently in Pain? Yes    Pain Score 4     Pain Location Shoulder     Pain Orientation Left    Pain Descriptors / Indicators Aching    Pain Type Chronic pain    Pain Onset More than a month ago    Pain Frequency Intermittent    Aggravating Factors  movement of the arm    Pain Relieving Factors rest and meds                             [facility] Adult PT Treatment/Exercise - 03/08/20 0001      Shoulder Exercises: Pulleys   Flexion 2 minutes    Scaption 2 minutes      Manual Therapy   Manual Therapy Passive ROM;Joint mobilization    Joint Mobilization Gentle GHJ mobs to reduce guarding; Posterior and inferior mobs    Soft tissue mobilization to upper trap    Passive ROM Flexion, abduction, ER to tolerance                   PT Education - 03/08/20 1019    Education Details educated on HEP and stretches to work on at home    [employer]) Educated Patient    Methods Explanation;Demonstration;Tactile cues;Verbal cues    Comprehension Verbalized understanding;Returned demonstration;Verbal cues required;Tactile cues required  PT Short Term Goals - 03/08/20 1315      PT SHORT TERM GOAL #1   Title He will be independent with initial HEP    Time 4    Period Weeks    Status On-going      PT SHORT TERM GOAL #2   Title safely progress  into 4-6 weeks of protocol.    Time 4    Period Weeks    Status On-going      PT SHORT TERM GOAL #3   Title No increased pain    Time 4    Period Weeks    Status On-going             PT Long Term Goals - 01/27/20 1348      PT LONG TERM GOAL #1   Title He will be indpendent with all hEP    Time 16    Period Weeks    Status New      PT LONG TERM GOAL #2   Title He will be able to lift  25# or higher with 1-2 max pain to be able to return to work.    Time 16    Period Weeks    Status New      PT LONG TERM GOAL #3   Title He will have fiull ROM to be able to RTW safely    Time 16    Period Weeks    Status New      PT LONG TERM GOAL #4   Title He wil have pain  max 1-2/10 with all normal home tasks    Time 16    Period Weeks    Status New      PT LONG TERM GOAL #5   Title Normal self care without compensation no pain    Time 16    Period Weeks    Status New      Additional Long Term Goals   Additional Long Term Goals Yes      PT LONG TERM GOAL #6   Title FOTO score will improve to 31% limited from 68% limited    Time 16    Period Weeks    Status New                 Plan - 03/08/20 1021    Clinical Impression Statement Therapy worked on PROM stretching for ER today. Posterior and inferior glides were done to help improve ER. ER was measured at 40 degrees today. He has increased pain with exercises today. He was scaled back to wand flexion and wand ER instead of active flexion and door ER stretch. Isometrics also held. he was advised to ice and stretch over the ext week but not to ove-do it. he will be going on vaction. He was advised tcontinue stretching ER even on vacation. if he continues to have pain when he comes back we can consider Iotno or other anti-inflamatory modalities    Examination-Activity Limitations Reach Overhead;Hygiene/Grooming;Bathing;Lift    Examination-Participation Restrictions Cleaning;Community Activity;Driving;Yard Work    Stability/Clinical Decision Making Stable/Uncomplicated    Clinical Decision Making Low    Rehab Potential Good    PT Frequency 2x / week    PT Duration 12 weeks    PT Treatment/Interventions Passive range of motion;Manual techniques;Patient/family education;Therapeutic exercise;Cryotherapy;Vasopneumatic Device;Electrical Stimulation    PT Next Visit Plan NEEDS FOTO STATUS ;manual for ROM and pain within protocol limits, progress AAROM as tolerated, scap stabilization, review and progress HEP  PT Home Exercise Plan pendulum, scap AROM, elbow AROM, gripping, supine shoulder ER PROM with dowel, seated table slide flexion PROM, supine AAROM flexion with dowel    Consulted and Agree with  Plan of Care Patient           Patient will benefit from skilled therapeutic intervention in order to improve the following deficits and impairments:  Pain, Impaired UE functional use, Decreased activity tolerance, Decreased range of motion, Increased muscle spasms, Decreased strength  Visit Diagnosis: Acute pain of left shoulder  Stiffness of left shoulder, not elsewhere classified  Muscle weakness (generalized)  Localized edema  Cramp and spasm  Status post left rotator cuff repair     Problem List Patient Active Problem List   Diagnosis Date Noted  . Contusion 10/22/2019  . Zoster 12/28/2018  . Emphysema of lung (HCC) 01/02/2017  . Smoker 01/02/2017  . Rash and nonspecific skin eruption 12/07/2015  . HIV disease (HCC) 03/06/2010     Dessie Coma PT DPT  03/08/2020, 1:17 PM  [facility] [address] [place], [place], [address] Phone: [phone]   Fax:  [phone]  Name: Jeremy Harper MRN: [id number] Date of Birth: 10-Jun-1981

## 2020-03-20 ENCOUNTER — Other Ambulatory Visit: Payer: Self-pay

## 2020-03-20 ENCOUNTER — Ambulatory Visit: Payer: 59 | Attending: Specialist | Admitting: Physical Therapy

## 2020-03-20 DIAGNOSIS — Z9889 Other specified postprocedural states: Secondary | ICD-10-CM | POA: Diagnosis not present

## 2020-03-20 DIAGNOSIS — R600 Localized edema: Secondary | ICD-10-CM | POA: Diagnosis not present

## 2020-03-20 DIAGNOSIS — M6281 Muscle weakness (generalized): Secondary | ICD-10-CM | POA: Insufficient documentation

## 2020-03-20 DIAGNOSIS — M25612 Stiffness of left shoulder, not elsewhere classified: Secondary | ICD-10-CM | POA: Insufficient documentation

## 2020-03-20 DIAGNOSIS — M25512 Pain in left shoulder: Secondary | ICD-10-CM | POA: Insufficient documentation

## 2020-03-20 DIAGNOSIS — R252 Cramp and spasm: Secondary | ICD-10-CM | POA: Insufficient documentation

## 2020-03-20 NOTE — Therapy (Addendum)
[place], [place], [address] Phone: [phone]   Fax:  [phone]  Physical Therapy Treatment  Patient Details  Name: Jeremy Harper MRN: [id number] Date of Birth: 1980/11/22 Referring Provider (PT): Hart Robinsons, MD   Encounter Date: 03/20/2020   PT End of Session - 03/20/20 1051    Visit Number 14    Number of Visits 31    Date for PT Re-Evaluation 05/12/20    PT Start Time 2993    PT Stop Time 1140    PT Time Calculation (min) 48 min    Activity Tolerance Patient tolerated treatment well    Behavior During Therapy [facility] for tasks assessed/performed           Past Medical History:  Diagnosis Date  . AIDS ([place]) 12/07/2015  . Emphysema of lung ([place]) 01/02/2017  . Rash and nonspecific skin eruption 12/07/2015  . Smoker 01/02/2017  . Thrush 12/07/2015  . Zoster 12/28/2018    Past Surgical History:  Procedure Laterality Date  . LAPAROSCOPY N/A 10/22/2019   Procedure: LAPAROSCOPY DIAGNOSTIC;  Surgeon: Jesusita Oka, MD;  Location: [place];  Service: General;  Laterality: N/A;  . LAPAROTOMY N/A 10/22/2019   Procedure: EXPLORATORY LAPAROTOMY;  Surgeon: Jesusita Oka, MD;  Location: [place];  Service: General;  Laterality: N/A;  . SMALL BOWEL REPAIR N/A 10/22/2019   Procedure: Small Bowel Repair;  Surgeon: Jesusita Oka, MD;  Location: [place];  Service: General;  Laterality: N/A;    There were no vitals filed for this visit.                      [facility] Adult PT Treatment/Exercise - 03/20/20 0001      Shoulder Exercises: ROM/Strengthening   Wall Pushups 20 reps    Prot/Ret//Elev/Dep x10 reps    Other ROM/Strengthening Exercises Finger wall crawl in flex & abd 3 reps hold 20 sec at end range    Other ROM/Strengthening Exercises Rolling foam roll on wall for flexion x 10 reps      Vasopneumatic   Number Minutes Vasopneumatic  10 minutes    Vasopnuematic Location  Shoulder     Vasopneumatic Pressure Low    Vasopneumatic Temperature  38      Manual Therapy   Manual Therapy Passive ROM;Joint mobilization    Joint Mobilization Gentle GHJ mobs to reduce guarding; Posterior and inferior mobs    Passive ROM Flexion, abduction, ER to tolerance                     PT Short Term Goals - 03/08/20 1315      PT SHORT TERM GOAL #1   Title He will be independent with initial HEP    Time 4    Period Weeks    Status On-going      PT SHORT TERM GOAL #2   Title safely progress  into 4-6 weeks of protocol.    Time 4    Period Weeks    Status On-going      PT SHORT TERM GOAL #3   Title No increased pain    Time 4    Period Weeks    Status On-going             PT Long Term Goals - 01/27/20 1348      PT LONG TERM GOAL #1   Title He will be indpendent with all hEP  Time 16    Period Weeks    Status New      PT LONG TERM GOAL #2   Title He will be able to lift  25# or higher with 1-2 max pain to be able to return to work.    Time 16    Period Weeks    Status New      PT LONG TERM GOAL #3   Title He will have fiull ROM to be able to RTW safely    Time 16    Period Weeks    Status New      PT LONG TERM GOAL #4   Title He wil have pain max 1-2/10 with all normal home tasks    Time 16    Period Weeks    Status New      PT LONG TERM GOAL #5   Title Normal self care without compensation no pain    Time 16    Period Weeks    Status New      Additional Long Term Goals   Additional Long Term Goals Yes      PT LONG TERM GOAL #6   Title FOTO score will improve to 31% limited from 68% limited    Time 16    Period Weeks    Status New                 Plan - 03/20/20 1124    Clinical Impression Statement Continued PROM to improve ER, flexion and abduction. Initiated scapular and shoulder stabilization exercises. Pt with improved FOTO score to 46%    Examination-Activity Limitations Reach Overhead;Hygiene/Grooming;Bathing;Lift     Examination-Participation Restrictions Cleaning;Community Activity;Driving;Yard Work    Stability/Clinical Decision Making Stable/Uncomplicated    Rehab Potential Good    PT Frequency 2x / week    PT Duration 12 weeks    PT Treatment/Interventions Passive range of motion;Manual techniques;Patient/family education;Therapeutic exercise;Cryotherapy;Vasopneumatic Device;Electrical Stimulation    PT Next Visit Plan manual for ROM and pain within protocol limits, progress AAROM as tolerated, scap stabilization, review and progress HEP    PT Home Exercise Plan pendulum, scap AROM, elbow AROM, gripping, supine shoulder ER PROM with dowel, seated table slide flexion PROM, supine AAROM flexion with dowel    Consulted and Agree with Plan of Care Patient           Patient will benefit from skilled therapeutic intervention in order to improve the following deficits and impairments:  Pain, Impaired UE functional use, Decreased activity tolerance, Decreased range of motion, Increased muscle spasms, Decreased strength  Visit Diagnosis: Acute pain of left shoulder  Stiffness of left shoulder, not elsewhere classified  Muscle weakness (generalized)  Localized edema  Cramp and spasm  Status post left rotator cuff repair     Problem List Patient Active Problem List   Diagnosis Date Noted  . Contusion 10/22/2019  . Zoster 12/28/2018  . Emphysema of lung ([place]) 01/02/2017  . Smoker 01/02/2017  . Rash and nonspecific skin eruption 12/07/2015  . HIV disease [facility]) 03/06/2010    Lucus Lambertson April Ma L [place] PT, DPT 03/20/2020, 11:40 AM  [facility] [address] [place], [place], [address] Phone: [phone]   Fax:  [phone]  Name: Jeremy Harper MRN: [id number] Date of Birth: [birth year]

## 2020-03-23 ENCOUNTER — Other Ambulatory Visit: Payer: Self-pay

## 2020-03-23 ENCOUNTER — Encounter: Payer: Self-pay | Admitting: Physical Therapy

## 2020-03-23 ENCOUNTER — Ambulatory Visit: Payer: 59 | Admitting: Physical Therapy

## 2020-03-23 DIAGNOSIS — M6281 Muscle weakness (generalized): Secondary | ICD-10-CM

## 2020-03-23 DIAGNOSIS — R252 Cramp and spasm: Secondary | ICD-10-CM | POA: Diagnosis not present

## 2020-03-23 DIAGNOSIS — Z9889 Other specified postprocedural states: Secondary | ICD-10-CM | POA: Diagnosis not present

## 2020-03-23 DIAGNOSIS — M25612 Stiffness of left shoulder, not elsewhere classified: Secondary | ICD-10-CM | POA: Diagnosis not present

## 2020-03-23 DIAGNOSIS — R600 Localized edema: Secondary | ICD-10-CM

## 2020-03-23 DIAGNOSIS — M25512 Pain in left shoulder: Secondary | ICD-10-CM | POA: Diagnosis not present

## 2020-03-23 NOTE — Therapy (Signed)
[facility] Outpatient Rehabilitation [facility] [address] [place], [place], [address] Phone: [phone]   Fax:  [phone]  Physical Therapy Treatment  Patient Details  Name: Jeremy Harper MRN: [id number] Date of Birth: [birth year] Referring Provider (PT): Valma Cava, MD   Encounter Date: 03/23/2020   PT End of Session - 03/23/20 1020    Visit Number 15    Number of Visits 31    Date for PT Re-Evaluation 05/12/20    Authorization Type UMR    PT Start Time 1017    PT Stop Time 1100    PT Time Calculation (min) 43 min    Activity Tolerance Patient tolerated treatment well    Behavior During Therapy [facility] for tasks assessed/performed           Past Medical History:  Diagnosis Date  . AIDS (HCC) 12/07/2015  . Emphysema of lung (HCC) 01/02/2017  . Rash and nonspecific skin eruption 12/07/2015  . Smoker 01/02/2017  . Thrush 12/07/2015  . Zoster 12/28/2018    Past Surgical History:  Procedure Laterality Date  . LAPAROSCOPY N/A 10/22/2019   Procedure: LAPAROSCOPY DIAGNOSTIC;  Surgeon: Diamantina Monks, MD;  [facility];  Service: General;  Laterality: N/A;  . LAPAROTOMY N/A 10/22/2019   Procedure: EXPLORATORY LAPAROTOMY;  Surgeon: Diamantina Monks, MD;  [facility];  Service: General;  Laterality: N/A;  . SMALL BOWEL REPAIR N/A 10/22/2019   Procedure: Small Bowel Repair;  Surgeon: Diamantina Monks, MD;  [facility];  Service: General;  Laterality: N/A;    There were no vitals filed for this visit.   Subjective Assessment - 03/23/20 1018    Subjective Patient is feeling pretty good today. Had some pain during last session but no pain/soreness afterwards.    Limitations Lifting;House hold activities    Patient Stated Goals To return to using LT arm for normal self care and home and work tasks.    Currently in Pain? Yes    Pain Score 1     Pain Location Shoulder    Pain Orientation Left    Pain Descriptors / Indicators Aching    Pain Type  Chronic pain    Pain Onset More than a month ago    Pain Frequency Intermittent    Aggravating Factors  movement of the arm    Pain Relieving Factors rest and meds              [facility] PT Assessment - 03/23/20 0001      PROM   Right Shoulder External Rotation 52 Degrees                         [facility] Adult PT Treatment/Exercise - 03/23/20 0001      Shoulder Exercises: Supine   Flexion AAROM   2x10; 2.5 lbs     Shoulder Exercises: Sidelying   External Rotation Limitations AROM 2x10 left      Shoulder Exercises: Standing   Extension Limitations 2x10 green    Row Limitations 2x10 green      Shoulder Exercises: Pulleys   Flexion 2 minutes    Scaption 2 minutes      Shoulder Exercises: ROM/Strengthening   Wall Pushups 20 reps    Other ROM/Strengthening Exercises Finger wall crawl in flex & abd 5 reps     Other ROM/Strengthening Exercises Rolling foam roll on wall for flexion x 10 reps; scap push up on wall x10  Shoulder Exercises: Stretch   Star Gazer Stretch 2 reps;30 seconds      Manual Therapy   Manual Therapy Passive ROM;Joint mobilization    Joint Mobilization Gentle GHJ mobs to reduce guarding; Posterior and inferior mobs    Soft tissue mobilization to upper trap    Passive ROM Flexion, abduction, ER to tolerance                     PT Short Term Goals - 03/23/20 1139      PT SHORT TERM GOAL #1   Title He will be independent with initial HEP    Time 4    Period Weeks    Status On-going      PT SHORT TERM GOAL #2   Title safely progress  into 4-6 weeks of protocol.    Time 4    Period Weeks    Status On-going      PT SHORT TERM GOAL #3   Title No increased pain    Time 4    Period Weeks    Status On-going             PT Long Term Goals - 03/23/20 1139      PT LONG TERM GOAL #1   Title He will be indpendent with all hEP    Time 16    Period Weeks    Status On-going      PT LONG TERM GOAL #2   Title He will be  able to lift  25# or higher with 1-2 max pain to be able to return to work.    Time 16    Period Weeks    Status On-going      PT LONG TERM GOAL #3   Title He will have full ROM to be able to RTW safely    Time 16    Period Weeks    Status On-going      PT LONG TERM GOAL #4   Title He wil have pain max 1-2/10 with all normal home tasks    Time 16    Period Weeks    Status On-going      PT LONG TERM GOAL #5   Title Normal self care without compensation no pain    Time 16    Period Weeks    Status On-going      PT LONG TERM GOAL #6   Title FOTO score will improve to 31% limited from 68% limited    Time 16    Period Weeks    Status On-going                 Plan - 03/23/20 1038    Clinical Impression Statement Therapy reviewed star gazer stretch with patient. Although patients ER has improved it is still behind where we would hope it would be at this time. He hd better functional ER then passive ER. He tolerated hter-ex well. he has some soreness with ther-ex but it does not last long. Therapy will continue to advance ther-ex to progress him towards return to work.    Examination-Activity Limitations Reach Overhead;Hygiene/Grooming;Bathing;Lift    Examination-Participation Restrictions Cleaning;Community Activity;Driving;Yard Work    Stability/Clinical Decision Making Stable/Uncomplicated    Clinical Decision Making Low    Rehab Potential Good    PT Frequency 2x / week    PT Duration 12 weeks    PT Treatment/Interventions Passive range of motion;Manual techniques;Patient/family education;Therapeutic exercise;Cryotherapy;Vasopneumatic Device;Electrical Stimulation    PT  Next Visit Plan manual for ROM and pain within protocol limits, progress AAROM as tolerated, scap stabilization, review and progress HEP    PT Home Exercise Plan pendulum, scap AROM, elbow AROM, gripping, supine shoulder ER PROM with dowel, seated table slide flexion PROM, supine AAROM flexion with dowel     Consulted and Agree with Plan of Care Patient           Patient will benefit from skilled therapeutic intervention in order to improve the following deficits and impairments:  Pain, Impaired UE functional use, Decreased activity tolerance, Decreased range of motion, Increased muscle spasms, Decreased strength  Visit Diagnosis: Acute pain of left shoulder  Stiffness of left shoulder, not elsewhere classified  Muscle weakness (generalized)  Localized edema  Cramp and spasm  Status post left rotator cuff repair     Problem List Patient Active Problem List   Diagnosis Date Noted  . Contusion 10/22/2019  . Zoster 12/28/2018  . Emphysema of lung (HCC) 01/02/2017  . Smoker 01/02/2017  . Rash and nonspecific skin eruption 12/07/2015  . HIV disease (HCC) 03/06/2010    Dessie Coma  PT DPT  03/23/2020, 2:30 PM   Lowell Bouton SPT  03/23/2020  [facility] Outpatient Rehabilitation [facility] [address] [place], [place], [address] Phone: [phone]   Fax:  [phone]  Name: LAVONTA TILLIS MRN: [id number] Date of Birth: 09/27/1980

## 2020-03-27 ENCOUNTER — Other Ambulatory Visit: Payer: Self-pay

## 2020-03-27 ENCOUNTER — Ambulatory Visit: Payer: 59 | Admitting: Physical Therapy

## 2020-03-27 DIAGNOSIS — M25512 Pain in left shoulder: Secondary | ICD-10-CM

## 2020-03-27 DIAGNOSIS — R252 Cramp and spasm: Secondary | ICD-10-CM

## 2020-03-27 DIAGNOSIS — M25612 Stiffness of left shoulder, not elsewhere classified: Secondary | ICD-10-CM

## 2020-03-27 DIAGNOSIS — Z9889 Other specified postprocedural states: Secondary | ICD-10-CM | POA: Diagnosis not present

## 2020-03-27 DIAGNOSIS — M6281 Muscle weakness (generalized): Secondary | ICD-10-CM | POA: Diagnosis not present

## 2020-03-27 DIAGNOSIS — R600 Localized edema: Secondary | ICD-10-CM

## 2020-03-27 NOTE — Therapy (Signed)
[facility] Outpatient Rehabilitation [facility] [address] [place], [place], [address] Phone: [phone]   Fax:  [phone]  Physical Therapy Treatment  Patient Details  Name: Jeremy Harper MRN: [id number] Date of Birth: 05/18/1981 Referring Provider (PT): Valma Cava, MD   Encounter Date: 03/27/2020   PT End of Session - 03/27/20 1005    Visit Number 16    Number of Visits 31    Date for PT Re-Evaluation 05/12/20    Authorization Type UMR    PT Start Time 0931    PT Stop Time 1001    PT Time Calculation (min) 30 min    Activity Tolerance Patient tolerated treatment well    Behavior During Therapy [facility] for tasks assessed/performed           Past Medical History:  Diagnosis Date   AIDS (HCC) 12/07/2015   Emphysema of lung (HCC) 01/02/2017   Rash and nonspecific skin eruption 12/07/2015   Smoker 01/02/2017   Thrush 12/07/2015   Zoster 12/28/2018    Past Surgical History:  Procedure Laterality Date   LAPAROSCOPY N/A 10/22/2019   Procedure: LAPAROSCOPY DIAGNOSTIC;  Surgeon: Diamantina Monks, MD;  Location: [facility] OR;  Service: General;  Laterality: N/A;   LAPAROTOMY N/A 10/22/2019   Procedure: EXPLORATORY LAPAROTOMY;  Surgeon: Diamantina Monks, MD;  Location: [facility] OR;  Service: General;  Laterality: N/A;   SMALL BOWEL REPAIR N/A 10/22/2019   Procedure: Small Bowel Repair;  Surgeon: Diamantina Monks, MD;  Location: [facility] OR;  Service: General;  Laterality: N/A;    There were no vitals filed for this visit.   Subjective Assessment - 03/27/20 0934    Subjective Pt states his arm will randomly hurt and then it would go away.    Limitations Lifting;House hold activities    Patient Stated Goals To return to using LT arm for normal self care and home and work tasks.    Currently in Pain? Yes    Pain Score 1     Pain Location Shoulder    Pain Orientation Left    Pain Descriptors / Indicators Aching    Pain Onset More than a month ago                              [facility] Adult PT Treatment/Exercise - 03/27/20 0001      Shoulder Exercises: Standing   Flexion AROM;10 reps;Left    ABduction AROM;Left;10 reps      Shoulder Exercises: ROM/Strengthening   UBE (Upper Arm Bike) L2 x 5 min    Other ROM/Strengthening Exercises Finger wall crawl in flex & abd 3 reps       Shoulder Exercises: Stretch   Star Gazer Stretch 3 reps;10 seconds    Other Shoulder Stretches Doorway stretch low, mid, high x 30 sec each      Vasopneumatic   Number Minutes Vasopneumatic  10 minutes    Vasopnuematic Location  Shoulder    Vasopneumatic Pressure Low    Vasopneumatic Temperature  34      Manual Therapy   Manual Therapy Passive ROM;Joint mobilization    Joint Mobilization Gentle GHJ mobs to reduce guarding; A/P and inferior mobs    Passive ROM Flexion, abduction, ER to tolerance                     PT Short Term Goals - 03/23/20 1139      PT SHORT  TERM GOAL #1   Title He will be independent with initial HEP    Time 4    Period Weeks    Status On-going      PT SHORT TERM GOAL #2   Title safely progress  into 4-6 weeks of protocol.    Time 4    Period Weeks    Status On-going      PT SHORT TERM GOAL #3   Title No increased pain    Time 4    Period Weeks    Status On-going             PT Long Term Goals - 03/23/20 1139      PT LONG TERM GOAL #1   Title He will be indpendent with all hEP    Time 16    Period Weeks    Status On-going      PT LONG TERM GOAL #2   Title He will be able to lift  25# or higher with 1-2 max pain to be able to return to work.    Time 16    Period Weeks    Status On-going      PT LONG TERM GOAL #3   Title He will have full ROM to be able to RTW safely    Time 16    Period Weeks    Status On-going      PT LONG TERM GOAL #4   Title He wil have pain max 1-2/10 with all normal home tasks    Time 16    Period Weeks    Status On-going      PT LONG TERM GOAL #5    Title Normal self care without compensation no pain    Time 16    Period Weeks    Status On-going      PT LONG TERM GOAL #6   Title FOTO score will improve to 31% limited from 68% limited    Time 16    Period Weeks    Status On-going                 Plan - 03/27/20 1006    Clinical Impression Statement Provided pt with alternative stretch for ER and pecs due to pt reporting he does not feel the star gazer stretch is as effective when he performs them at home. Treatment continues to focus on improving ER with manual therapy and stretching/strengthening exercises. Pt mostly limited in ER and abduction. Complains of pain near insertion of supraspinatus during PROM.    Examination-Activity Limitations Reach Overhead;Hygiene/Grooming;Bathing;Lift    Examination-Participation Restrictions Cleaning;Community Activity;Driving;Yard Work    Stability/Clinical Decision Making Stable/Uncomplicated    Rehab Potential Good    PT Frequency 2x / week    PT Duration 12 weeks    PT Treatment/Interventions Passive range of motion;Manual techniques;Patient/family education;Therapeutic exercise;Cryotherapy;Vasopneumatic Device;Electrical Stimulation    PT Next Visit Plan manual for ROM and pain within protocol limits, progress AAROM as tolerated, scap stabilization, review and progress HEP    PT Home Exercise Plan pendulum, scap AROM, elbow AROM, gripping, supine shoulder ER PROM with dowel, seated table slide flexion PROM, supine AAROM flexion with dowel    Consulted and Agree with Plan of Care Patient           Patient will benefit from skilled therapeutic intervention in order to improve the following deficits and impairments:  Pain, Impaired UE functional use, Decreased activity tolerance, Decreased range of motion, Increased muscle spasms, Decreased  strength  Visit Diagnosis: Acute pain of left shoulder  Stiffness of left shoulder, not elsewhere classified  Muscle weakness  (generalized)  Localized edema  Cramp and spasm  Status post left rotator cuff repair     Problem List Patient Active Problem List   Diagnosis Date Noted   Contusion 10/22/2019   Zoster 12/28/2018   Emphysema of lung (HCC) 01/02/2017   Smoker 01/02/2017   Rash and nonspecific skin eruption 12/07/2015   HIV disease (HCC) 03/06/2010    [place] Devine April Ma L Quintavia Rogstad PT, DPT 03/27/2020, 11:21 AM  [facility] [address] [place], [place], [address] Phone: [phone]   Fax:  [phone]  Name: DAMASO LADAY MRN: [id number] Date of Birth: Jul 28, 1981

## 2020-03-29 ENCOUNTER — Other Ambulatory Visit: Payer: Self-pay

## 2020-03-29 ENCOUNTER — Ambulatory Visit: Payer: 59 | Admitting: Physical Therapy

## 2020-03-29 DIAGNOSIS — M25612 Stiffness of left shoulder, not elsewhere classified: Secondary | ICD-10-CM

## 2020-03-29 DIAGNOSIS — M6281 Muscle weakness (generalized): Secondary | ICD-10-CM | POA: Diagnosis not present

## 2020-03-29 DIAGNOSIS — Z9889 Other specified postprocedural states: Secondary | ICD-10-CM | POA: Diagnosis not present

## 2020-03-29 DIAGNOSIS — M25512 Pain in left shoulder: Secondary | ICD-10-CM

## 2020-03-29 DIAGNOSIS — R252 Cramp and spasm: Secondary | ICD-10-CM | POA: Diagnosis not present

## 2020-03-29 DIAGNOSIS — R600 Localized edema: Secondary | ICD-10-CM

## 2020-03-29 NOTE — Patient Instructions (Signed)
Access Code: [email or web address] URL: [email or web address] Date: 03/29/2020 Prepared by: Vernon Prey April Kirstie Peri  Exercises Supine Shoulder Horizontal Abduction with Resistance - 1 x daily - 7 x weekly - 3 sets - 10 reps Supine Single Arm Shoulder Flexion with Resistance - 1 x daily - 7 x weekly - 3 sets - 10 reps Supine Shoulder Abduction AROM - 1 x daily - 7 x weekly - 3 sets - 10 reps Supine Bilateral Shoulder External Rotation with Resistance around Wrists - 1 x daily - 7 x weekly - 3 sets - 10 reps Sleeper Stretch - 1 x daily - 7 x weekly - 3 sets - 30 sec hold Standing Shoulder External Rotation Stretch in Doorway - 1 x daily - 7 x weekly - 3 sets - 30 sec hold

## 2020-03-29 NOTE — Therapy (Signed)
[facility] Outpatient Rehabilitation [facility] [address] [place], [place], [address] Phone: [phone]   Fax:  [phone]  Physical Therapy Treatment  Patient Details  Name: Jeremy Harper MRN: [id number] Date of Birth: 15-Oct-1980 Referring Provider (PT): Valma Cava, MD   Encounter Date: 03/29/2020   PT End of Session - 03/29/20 0928    Visit Number 17    Number of Visits 31    Date for PT Re-Evaluation 05/12/20    Authorization Type UMR    PT Start Time 0920    PT Stop Time 1001    PT Time Calculation (min) 41 min    Activity Tolerance Patient tolerated treatment well    Behavior During Therapy [facility] for tasks assessed/performed           Past Medical History:  Diagnosis Date  . AIDS (HCC) 12/07/2015  . Emphysema of lung (HCC) 01/02/2017  . Rash and nonspecific skin eruption 12/07/2015  . Smoker 01/02/2017  . Thrush 12/07/2015  . Zoster 12/28/2018    Past Surgical History:  Procedure Laterality Date  . LAPAROSCOPY N/A 10/22/2019   Procedure: LAPAROSCOPY DIAGNOSTIC;  Surgeon: Diamantina Monks, MD;  [facility]: [facility] OR;  Service: General;  Laterality: N/A;  . LAPAROTOMY N/A 10/22/2019   Procedure: EXPLORATORY LAPAROTOMY;  Surgeon: Diamantina Monks, MD;  Location: [facility] OR;  Service: General;  Laterality: N/A;  . SMALL BOWEL REPAIR N/A 10/22/2019   Procedure: Small Bowel Repair;  Surgeon: Diamantina Monks, MD;  Location: [facility] OR;  Service: General;  Laterality: N/A;    There were no vitals filed for this visit.   Subjective Assessment - 03/29/20 0923    Subjective Pt states his arm wasn't too sore from last session.    Limitations Lifting;House hold activities    Patient Stated Goals To return to using LT arm for normal self care and home and work tasks.    Currently in Pain? Yes    Pain Score 1     Pain Onset More than a month ago                             [facility] Adult PT Treatment/Exercise - 03/29/20 0001      Shoulder  Exercises: Supine   Horizontal ABduction Strengthening;Left;Theraband;20 reps    Theraband Level (Shoulder Horizontal ABduction) Level 1 (Yellow)    External Rotation Strengthening;Both;20 reps;Theraband    Theraband Level (Shoulder External Rotation) Level 1 (Yellow)    Flexion Strengthening;Left;Theraband;20 reps    Theraband Level (Shoulder Flexion) Level 1 (Yellow)    ABduction Strengthening;Left;20 reps;Theraband    Theraband Level (Shoulder ABduction) Level 1 (Yellow)      Shoulder Exercises: ROM/Strengthening   UBE (Upper Arm Bike) L4 x 6 min    Wall Wash flexion, abduction, CW & CCW x 30 sec each      Shoulder Exercises: Stretch   Cross Chest Stretch 30 seconds;2 reps    Other Shoulder Stretches sleeper stretch 10 x 3 sec      Manual Therapy   Manual Therapy Passive ROM;Joint mobilization    Joint Mobilization GHJ mobs to reduce guarding; A/P and inferior mobs    Passive ROM Flexion, abduction, ER to tolerance                     PT Short Term Goals - 03/23/20 1139      PT SHORT TERM GOAL #1   Title He  will be independent with initial HEP    Time 4    Period Weeks    Status On-going      PT SHORT TERM GOAL #2   Title safely progress  into 4-6 weeks of protocol.    Time 4    Period Weeks    Status On-going      PT SHORT TERM GOAL #3   Title No increased pain    Time 4    Period Weeks    Status On-going             PT Long Term Goals - 03/23/20 1139      PT LONG TERM GOAL #1   Title He will be indpendent with all hEP    Time 16    Period Weeks    Status On-going      PT LONG TERM GOAL #2   Title He will be able to lift  25# or higher with 1-2 max pain to be able to return to work.    Time 16    Period Weeks    Status On-going      PT LONG TERM GOAL #3   Title He will have full ROM to be able to RTW safely    Time 16    Period Weeks    Status On-going      PT LONG TERM GOAL #4   Title He wil have pain max 1-2/10 with all normal  home tasks    Time 16    Period Weeks    Status On-going      PT LONG TERM GOAL #5   Title Normal self care without compensation no pain    Time 16    Period Weeks    Status On-going      PT LONG TERM GOAL #6   Title FOTO score will improve to 31% limited from 68% limited    Time 16    Period Weeks    Status On-going                 Plan - 03/29/20 4967    Clinical Impression Statement Pt is 10 weeks post-op. PT initiating light strengthening with yellow tband. Continued focus on improving ER with manual therapy and stretching.    Examination-Activity Limitations Reach Overhead;Hygiene/Grooming;Bathing;Lift    Examination-Participation Restrictions Cleaning;Community Activity;Driving;Yard Work    Stability/Clinical Decision Making Stable/Uncomplicated    Rehab Potential Good    PT Frequency 2x / week    PT Duration 12 weeks    PT Treatment/Interventions Passive range of motion;Manual techniques;Patient/family education;Therapeutic exercise;Cryotherapy;Vasopneumatic Device;Electrical Stimulation    PT Next Visit Plan manual for ROM and pain within protocol limits, progress AAROM as tolerated, scap stabilization, review and progress HEP    PT Home Exercise Plan pendulum, scap AROM, elbow AROM, gripping, supine shoulder ER PROM with dowel, seated table slide flexion PROM, supine AAROM flexion with dowel    Consulted and Agree with Plan of Care Patient           Patient will benefit from skilled therapeutic intervention in order to improve the following deficits and impairments:  Pain, Impaired UE functional use, Decreased activity tolerance, Decreased range of motion, Increased muscle spasms, Decreased strength  Visit Diagnosis: Acute pain of left shoulder  Stiffness of left shoulder, not elsewhere classified  Muscle weakness (generalized)  Localized edema  Cramp and spasm  Status post left rotator cuff repair     Problem List Patient Active Problem  List    Diagnosis Date Noted  . Contusion 10/22/2019  . Zoster 12/28/2018  . Emphysema of lung (HCC) 01/02/2017  . Smoker 01/02/2017  . Rash and nonspecific skin eruption 12/07/2015  . HIV disease [facility]) 03/06/2010    Griselle Rufer April Ma L Jashua Knaak PT, DPT 03/29/2020, 11:54 AM  [facility] [address] [place], [place], [address] Phone: [phone]   Fax:  [phone]  Name: Jeremy Harper MRN: [id number] Date of Birth: [birth year]

## 2020-03-30 MED FILL — DESCOVY 200-25 MG TABS: 200-25 | 30 days supply | Qty: 30 | Fill #1

## 2020-03-30 MED FILL — TIVICAY 50 MG TABLET: 50 | 30 days supply | Qty: 30 | Fill #10

## 2020-04-03 ENCOUNTER — Ambulatory Visit: Payer: 59 | Admitting: Physical Therapy

## 2020-04-03 ENCOUNTER — Other Ambulatory Visit: Payer: Self-pay

## 2020-04-03 DIAGNOSIS — R600 Localized edema: Secondary | ICD-10-CM

## 2020-04-03 DIAGNOSIS — M25512 Pain in left shoulder: Secondary | ICD-10-CM

## 2020-04-03 DIAGNOSIS — Z9889 Other specified postprocedural states: Secondary | ICD-10-CM | POA: Diagnosis not present

## 2020-04-03 DIAGNOSIS — R252 Cramp and spasm: Secondary | ICD-10-CM

## 2020-04-03 DIAGNOSIS — M25612 Stiffness of left shoulder, not elsewhere classified: Secondary | ICD-10-CM

## 2020-04-03 DIAGNOSIS — M6281 Muscle weakness (generalized): Secondary | ICD-10-CM | POA: Diagnosis not present

## 2020-04-03 NOTE — Therapy (Addendum)
[facility] Outpatient Rehabilitation [facility] [address] [place], [place], [address] Phone: [phone]   Fax:  [phone]  Physical Therapy Treatment  Patient Details  Name: Jeremy Harper MRN: [id number] Date of Birth: [birth year] Referring Provider (PT): Valma Cava, MD   Encounter Date: 04/03/2020   PT End of Session - 04/03/20 0919    Visit Number 18    Number of Visits 31    Date for PT Re-Evaluation 05/12/20    Authorization Type UMR    PT Start Time 0916    Activity Tolerance Patient tolerated treatment well    Behavior During Therapy [facility] for tasks assessed/performed           Past Medical History:  Diagnosis Date  . AIDS (HCC) 12/07/2015  . Emphysema of lung (HCC) 01/02/2017  . Rash and nonspecific skin eruption 12/07/2015  . Smoker 01/02/2017  . Thrush 12/07/2015  . Zoster 12/28/2018    Past Surgical History:  Procedure Laterality Date  . LAPAROSCOPY N/A 10/22/2019   Procedure: LAPAROSCOPY DIAGNOSTIC;  Surgeon: Diamantina Monks, MD;  Location: [facility] OR;  Service: General;  Laterality: N/A;  . LAPAROTOMY N/A 10/22/2019   Procedure: EXPLORATORY LAPAROTOMY;  Surgeon: Diamantina Monks, MD;  Location: [facility] OR;  Service: General;  Laterality: N/A;  . SMALL BOWEL REPAIR N/A 10/22/2019   Procedure: Small Bowel Repair;  Surgeon: Diamantina Monks, MD;  Location: [facility] OR;  Service: General;  Laterality: N/A;    There were no vitals filed for this visit.   Subjective Assessment - 04/03/20 0918    Subjective Pt states he was sore for a little bit after last treatment but it went away. Pt reports that he has definitely been feeling the stretches.    Limitations Lifting;House hold activities    Patient Stated Goals To return to using LT arm for normal self care and home and work tasks.    Currently in Pain? No/denies    Pain Onset More than a month ago                             [facility] Adult PT Treatment/Exercise - 04/03/20 0001       Shoulder Exercises: Supine   External Rotation Strengthening;Both;Theraband;10 reps    Theraband Level (Shoulder External Rotation) Level 1 (Yellow)    Flexion Strengthening;Left;Theraband;10 reps    Theraband Level (Shoulder Flexion) Level 2 (Red)    ABduction Strengthening;Left;Theraband;10 reps    Theraband Level (Shoulder ABduction) Level 2 (Red)      Shoulder Exercises: Standing   Other Standing Exercises counter push - up 2 x10 reps      Shoulder Exercises: ROM/Strengthening   UBE (Upper Arm Bike) L4 x 5 min      Shoulder Exercises: Stretch   Cross Chest Stretch 30 seconds;2 reps    Other Shoulder Stretches sleeper stretch 2x30 sec    Other Shoulder Stretches contract relax x3 for flexion, abduction, ER & IR      Vasopneumatic   Number Minutes Vasopneumatic  10 minutes    Vasopnuematic Location  Shoulder    Vasopneumatic Pressure Low    Vasopneumatic Temperature  34      Manual Therapy   Manual Therapy Passive ROM;Joint mobilization    Joint Mobilization GHJ mobs to reduce guarding; A/P and inferior mobs    Soft tissue mobilization pec major & minor    Passive ROM Flexion, abduction, ER to tolerance  PT Short Term Goals - 04/03/20 0102      PT SHORT TERM GOAL #1   Title He will be independent with initial HEP    Time 4    Period Weeks    Status Achieved      PT SHORT TERM GOAL #2   Title safely progress  into 4-6 weeks of protocol.    Time 4    Period Weeks    Status Achieved      PT SHORT TERM GOAL #3   Title No increased pain    Time 4    Period Weeks    Status Achieved             PT Long Term Goals - 03/23/20 1139      PT LONG TERM GOAL #1   Title He will be indpendent with all hEP    Time 16    Period Weeks    Status On-going      PT LONG TERM GOAL #2   Title He will be able to lift  25# or higher with 1-2 max pain to be able to return to work.    Time 16    Period Weeks    Status On-going      PT LONG  TERM GOAL #3   Title He will have full ROM to be able to RTW safely    Time 16    Period Weeks    Status On-going      PT LONG TERM GOAL #4   Title He wil have pain max 1-2/10 with all normal home tasks    Time 16    Period Weeks    Status On-going      PT LONG TERM GOAL #5   Title Normal self care without compensation no pain    Time 16    Period Weeks    Status On-going      PT LONG TERM GOAL #6   Title FOTO score will improve to 31% limited from 68% limited    Time 16    Period Weeks    Status On-going                 Plan - 04/03/20 7253    Clinical Impression Statement Pt with improving strength and ROM. ER PROM to ~70 deg after extensive contract-relax, PROM, and joint mobilization. Flexion and abduction to almost end range.    Examination-Activity Limitations Reach Overhead;Hygiene/Grooming;Bathing;Lift    Examination-Participation Restrictions Cleaning;Community Activity;Driving;Yard Work    Stability/Clinical Decision Making Stable/Uncomplicated    Rehab Potential Good    PT Frequency 2x / week    PT Duration 12 weeks    PT Treatment/Interventions Passive range of motion;Manual techniques;Patient/family education;Therapeutic exercise;Cryotherapy;Vasopneumatic Device;Electrical Stimulation    PT Next Visit Plan manual for ROM and pain within protocol limits, progress AAROM as tolerated, scap stabilization, review and progress HEP    PT Home Exercise Plan Access Code: [email or web address]    Consulted and Agree with Plan of Care Patient           Patient will benefit from skilled therapeutic intervention in order to improve the following deficits and impairments:  Pain, Impaired UE functional use, Decreased activity tolerance, Decreased range of motion, Increased muscle spasms, Decreased strength  Visit Diagnosis: Acute pain of left shoulder  Stiffness of left shoulder, not elsewhere classified  Muscle weakness (generalized)  Localized edema  Cramp and  spasm  Status post left rotator cuff repair  Problem List Patient Active Problem List   Diagnosis Date Noted  . Contusion 10/22/2019  . Zoster 12/28/2018  . Emphysema of lung (HCC) 01/02/2017  . Smoker 01/02/2017  . Rash and nonspecific skin eruption 12/07/2015  . HIV disease [facility]) 03/06/2010    [facility] [address] [place] PT, DPT 04/03/2020, 10:02 AM  [facility] [address] [place], [place], [address] Phone: [phone]   Fax:  [phone]  Name: Jeremy Harper MRN: [id number] Date of Birth: August 29, 1981

## 2020-04-05 ENCOUNTER — Encounter: Payer: Self-pay | Admitting: Physical Therapy

## 2020-04-05 ENCOUNTER — Other Ambulatory Visit: Payer: Self-pay

## 2020-04-05 ENCOUNTER — Ambulatory Visit: Payer: 59 | Admitting: Physical Therapy

## 2020-04-05 DIAGNOSIS — R252 Cramp and spasm: Secondary | ICD-10-CM

## 2020-04-05 DIAGNOSIS — R600 Localized edema: Secondary | ICD-10-CM | POA: Diagnosis not present

## 2020-04-05 DIAGNOSIS — Z9889 Other specified postprocedural states: Secondary | ICD-10-CM

## 2020-04-05 DIAGNOSIS — M6281 Muscle weakness (generalized): Secondary | ICD-10-CM

## 2020-04-05 DIAGNOSIS — M25612 Stiffness of left shoulder, not elsewhere classified: Secondary | ICD-10-CM

## 2020-04-05 DIAGNOSIS — M25512 Pain in left shoulder: Secondary | ICD-10-CM | POA: Diagnosis not present

## 2020-04-05 NOTE — Therapy (Signed)
[facility] [address] [place], [place], [address] Phone: [phone]   Fax:  [phone]  Physical Therapy Treatment  Patient Details  Name: Jeremy Harper MRN: [id number] Date of Birth: [birth year] Referring Provider (PT): Valma Cava, MD   Encounter Date: 04/05/2020   PT End of Session - 04/05/20 1449    Visit Number 19    Number of Visits 31    Date for PT Re-Evaluation 05/12/20    Authorization Type UMR    PT Start Time [phone]   Patient 4 minutes late   PT Stop Time 1012    PT Time Calculation (min) 38 min    Activity Tolerance Patient tolerated treatment well    Behavior During Therapy [facility] for tasks assessed/performed           Past Medical History:  Diagnosis Date  . AIDS (HCC) 12/07/2015  . Emphysema of lung (HCC) 01/02/2017  . Rash and nonspecific skin eruption 12/07/2015  . Smoker 01/02/2017  . Thrush 12/07/2015  . Zoster 12/28/2018    Past Surgical History:  Procedure Laterality Date  . LAPAROSCOPY N/A 10/22/2019   Procedure: LAPAROSCOPY DIAGNOSTIC;  Surgeon: Diamantina Monks, MD;  Location: [facility] OR;  Service: General;  Laterality: N/A;  . LAPAROTOMY N/A 10/22/2019   Procedure: EXPLORATORY LAPAROTOMY;  Surgeon: Diamantina Monks, MD;  Location: [facility] OR;  Service: General;  Laterality: N/A;  . SMALL BOWEL REPAIR N/A 10/22/2019   Procedure: Small Bowel Repair;  Surgeon: Diamantina Monks, MD;  Location: [facility] OR;  Service: General;  Laterality: N/A;    There were no vitals filed for this visit.   Subjective Assessment - 04/05/20 0941    Subjective Patient has a little soreness at times but no pain at this time. He has been working on his exercises. he feels like his shoulder range has improved.    Limitations Lifting;House hold activities    Patient Stated Goals To return to using LT arm for normal self care and home and work tasks.    Currently in Pain? No/denies              [facility] PT Assessment - 04/05/20  0001      PROM   Right Shoulder External Rotation 67 Degrees                         [facility] Adult PT Treatment/Exercise - 04/05/20 0001      Shoulder Exercises: Seated   Other Seated Exercises row machine 25lbs 3x10; lat pull down 3x10;       Shoulder Exercises: Standing   Other Standing Exercises shelf reach 2lb 3x10     Other Standing Exercises towel vs wall 15 up and down 15 cw 15 ccw 15 side to side       Shoulder Exercises: ROM/Strengthening   UBE (Upper Arm Bike) 82fwd 2 back       Manual Therapy   Manual Therapy Passive ROM;Joint mobilization    Joint Mobilization GHJ mobs to reduce guarding; A/P and inferior mobs    Soft tissue mobilization pec major & minor    Passive ROM Flexion, abduction, ER to tolerance                   PT Education - 04/05/20 1449    Education Details reviewed HEP and symptom mangement    Person(s) Educated Patient    Methods Explanation;Demonstration    Comprehension Verbalized understanding;Returned demonstration;Verbal  cues required;Tactile cues required            PT Short Term Goals - 04/03/20 0958      PT SHORT TERM GOAL #1   Title He will be independent with initial HEP    Time 4    Period Weeks    Status Achieved      PT SHORT TERM GOAL #2   Title safely progress  into 4-6 weeks of protocol.    Time 4    Period Weeks    Status Achieved      PT SHORT TERM GOAL #3   Title No increased pain    Time 4    Period Weeks    Status Achieved             PT Long Term Goals - 03/23/20 1139      PT LONG TERM GOAL #1   Title He will be indpendent with all hEP    Time 16    Period Weeks    Status On-going      PT LONG TERM GOAL #2   Title He will be able to lift  25# or higher with 1-2 max pain to be able to return to work.    Time 16    Period Weeks    Status On-going      PT LONG TERM GOAL #3   Title He will have full ROM to be able to RTW safely    Time 16    Period Weeks    Status On-going       PT LONG TERM GOAL #4   Title He wil have pain max 1-2/10 with all normal home tasks    Time 16    Period Weeks    Status On-going      PT LONG TERM GOAL #5   Title Normal self care without compensation no pain    Time 16    Period Weeks    Status On-going      PT LONG TERM GOAL #6   Title FOTO score will improve to 31% limited from 68% limited    Time 16    Period Weeks    Status On-going                 Plan - 04/05/20 1451    Clinical Impression Statement Patients er improved significantly today. it was measured at 67 degrees. He was advised to continue to progress as tolerated.    Examination-Activity Limitations Reach Overhead;Hygiene/Grooming;Bathing;Lift    Examination-Participation Restrictions Cleaning;Community Activity;Driving;Yard Work    Stability/Clinical Decision Making Stable/Uncomplicated    Clinical Decision Making Low    Rehab Potential Good    PT Frequency 2x / week    PT Duration 12 weeks    PT Treatment/Interventions Passive range of motion;Manual techniques;Patient/family education;Therapeutic exercise;Cryotherapy;Vasopneumatic Device;Electrical Stimulation    PT Next Visit Plan manual for ROM and pain within protocol limits, progress AAROM as tolerated, scap stabilization, review and progress HEP    PT Home Exercise Plan Access Code: [email or web address]    Consulted and Agree with Plan of Care Patient           Patient will benefit from skilled therapeutic intervention in order to improve the following deficits and impairments:  Pain, Impaired UE functional use, Decreased activity tolerance, Decreased range of motion, Increased muscle spasms, Decreased strength  Visit Diagnosis: Acute pain of left shoulder  Stiffness of left shoulder, not elsewhere classified  Muscle weakness (generalized)  Localized edema  Cramp and spasm  Status post left rotator cuff repair     Problem List Patient Active Problem List   Diagnosis Date Noted  .  Contusion 10/22/2019  . Zoster 12/28/2018  . Emphysema of lung (HCC) 01/02/2017  . Smoker 01/02/2017  . Rash and nonspecific skin eruption 12/07/2015  . HIV disease (HCC) 03/06/2010    Dessie Coma PT DPT  04/05/2020, 2:58 PM  [facility] [address] [place], [place], [address] Phone: [phone]   Fax:  [phone]  Name: Jeremy Harper MRN: [id number] Date of Birth: 10/07/1980

## 2020-04-10 ENCOUNTER — Encounter (HOSPITAL_COMMUNITY): Payer: Self-pay | Admitting: Emergency Medicine

## 2020-04-10 ENCOUNTER — Other Ambulatory Visit: Payer: Self-pay

## 2020-04-10 ENCOUNTER — Emergency Department (HOSPITAL_COMMUNITY)
Admission: EM | Admit: 2020-04-10 | Discharge: 2020-04-11 | Disposition: A | Discharge status: Home / Self Care | Payer: 59 | Attending: Emergency Medicine | Admitting: Emergency Medicine

## 2020-04-10 DIAGNOSIS — M791 Myalgia, unspecified site: Secondary | ICD-10-CM | POA: Diagnosis not present

## 2020-04-10 DIAGNOSIS — Y92410 Unspecified street and highway as the place of occurrence of the external cause: Secondary | ICD-10-CM | POA: Insufficient documentation

## 2020-04-10 DIAGNOSIS — Y999 Unspecified external cause status: Secondary | ICD-10-CM | POA: Insufficient documentation

## 2020-04-10 DIAGNOSIS — Y939 Activity, unspecified: Secondary | ICD-10-CM | POA: Diagnosis not present

## 2020-04-10 DIAGNOSIS — Z041 Encounter for examination and observation following transport accident: Secondary | ICD-10-CM | POA: Diagnosis not present

## 2020-04-10 DIAGNOSIS — Z5321 Procedure and treatment not carried out due to patient leaving prior to being seen by health care provider: Secondary | ICD-10-CM | POA: Diagnosis not present

## 2020-04-10 NOTE — ED Notes (Signed)
Pt stated that he would like to be taken out of the system because he cannot wait any longer. Pt advised to stay. Pt seen leaving ED.
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
## 2020-04-11 ENCOUNTER — Ambulatory Visit (HOSPITAL_COMMUNITY)
Admission: EM | Admit: 2020-04-11 | Discharge: 2020-04-11 | Disposition: A | Discharge status: Home / Self Care | Payer: 59

## 2020-04-11 ENCOUNTER — Encounter (HOSPITAL_COMMUNITY): Payer: Self-pay

## 2020-04-11 ENCOUNTER — Other Ambulatory Visit: Payer: Self-pay

## 2020-04-11 ENCOUNTER — Ambulatory Visit: Payer: 59 | Admitting: Physical Therapy

## 2020-04-11 DIAGNOSIS — M25512 Pain in left shoulder: Secondary | ICD-10-CM | POA: Diagnosis not present
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
## 2020-04-12 ENCOUNTER — Encounter: Payer: 59 | Admitting: Physical Therapy

## 2020-04-12 DIAGNOSIS — S46012D Strain of muscle(s) and tendon(s) of the rotator cuff of left shoulder, subsequent encounter: Secondary | ICD-10-CM | POA: Diagnosis not present

## 2020-04-12 DIAGNOSIS — Z4789 Encounter for other orthopedic aftercare: Secondary | ICD-10-CM | POA: Diagnosis not present
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
## 2020-04-14 ENCOUNTER — Encounter: Payer: Self-pay | Admitting: Physical Therapy

## 2020-04-14 ENCOUNTER — Ambulatory Visit: Payer: 59 | Attending: Specialist | Admitting: Physical Therapy

## 2020-04-14 ENCOUNTER — Other Ambulatory Visit: Payer: Self-pay

## 2020-04-14 DIAGNOSIS — M6281 Muscle weakness (generalized): Secondary | ICD-10-CM | POA: Diagnosis not present

## 2020-04-14 DIAGNOSIS — Z9889 Other specified postprocedural states: Secondary | ICD-10-CM | POA: Insufficient documentation

## 2020-04-14 DIAGNOSIS — R252 Cramp and spasm: Secondary | ICD-10-CM | POA: Insufficient documentation

## 2020-04-14 DIAGNOSIS — R600 Localized edema: Secondary | ICD-10-CM | POA: Insufficient documentation

## 2020-04-14 DIAGNOSIS — M25512 Pain in left shoulder: Secondary | ICD-10-CM | POA: Insufficient documentation

## 2020-04-14 DIAGNOSIS — M25612 Stiffness of left shoulder, not elsewhere classified: Secondary | ICD-10-CM | POA: Insufficient documentation
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
## 2020-04-19 ENCOUNTER — Encounter: Payer: Self-pay | Admitting: Physical Therapy

## 2020-04-19 ENCOUNTER — Other Ambulatory Visit: Payer: Self-pay

## 2020-04-19 ENCOUNTER — Ambulatory Visit: Payer: 59 | Admitting: Physical Therapy

## 2020-04-19 DIAGNOSIS — R600 Localized edema: Secondary | ICD-10-CM

## 2020-04-19 DIAGNOSIS — M25512 Pain in left shoulder: Secondary | ICD-10-CM

## 2020-04-19 DIAGNOSIS — R252 Cramp and spasm: Secondary | ICD-10-CM | POA: Diagnosis not present

## 2020-04-19 DIAGNOSIS — Z9889 Other specified postprocedural states: Secondary | ICD-10-CM | POA: Diagnosis not present

## 2020-04-19 DIAGNOSIS — M6281 Muscle weakness (generalized): Secondary | ICD-10-CM

## 2020-04-19 DIAGNOSIS — M25612 Stiffness of left shoulder, not elsewhere classified: Secondary | ICD-10-CM

## 2020-04-19 NOTE — Therapy (Signed)
[facility] Outpatient Rehabilitation [facility] [address] [place], [place], [address] Phone: [phone]   Fax:  [phone]  Physical Therapy Treatment  Patient Details  Name: Jeremy Harper MRN: [id number] Date of Birth: Feb 17, 1981 Referring Provider (PT): Valma Cava, MD   Encounter Date: 04/19/2020   PT End of Session - 04/19/20 0815    Visit Number 21    Number of Visits 31    Date for PT Re-Evaluation 05/12/20    Authorization Type UMR    PT Start Time [phone]   pt arrived 12 minutes late   PT Stop Time 0846    PT Time Calculation (min) 32 min    Activity Tolerance Patient tolerated treatment well    Behavior During Therapy [facility] for tasks assessed/performed           Past Medical History:  Diagnosis Date  . AIDS (HCC) 12/07/2015  . Emphysema of lung (HCC) 01/02/2017  . Rash and nonspecific skin eruption 12/07/2015  . Smoker 01/02/2017  . Thrush 12/07/2015  . Zoster 12/28/2018    Past Surgical History:  Procedure Laterality Date  . LAPAROSCOPY N/A 10/22/2019   Procedure: LAPAROSCOPY DIAGNOSTIC;  Surgeon: Diamantina Monks, MD;  Location: [facility] OR;  Service: General;  Laterality: N/A;  . LAPAROTOMY N/A 10/22/2019   Procedure: EXPLORATORY LAPAROTOMY;  Surgeon: Diamantina Monks, MD;  Location: [facility] OR;  Service: General;  Laterality: N/A;  . SMALL BOWEL REPAIR N/A 10/22/2019   Procedure: Small Bowel Repair;  Surgeon: Diamantina Monks, MD;  Location: [facility] OR;  Service: General;  Laterality: N/A;    There were no vitals filed for this visit.   Subjective Assessment - 04/19/20 0816    Subjective Pt reports he is feeling good today. He has no pain and is feeling better from the accident last week.    Limitations Lifting;House hold activities    Patient Stated Goals To return to using LT arm for normal self care and home and work tasks.    Currently in Pain? No/denies    Pain Score 0-No pain    Pain Location Shoulder    Pain Orientation Left    Pain  Descriptors / Indicators Aching    Pain Type Acute pain    Pain Onset More than a month ago    Pain Frequency Intermittent    Aggravating Factors  movement of the arm    Pain Relieving Factors rest and meds              [facility] PT Assessment - 04/19/20 0001      Assessment   Medical Diagnosis Left rotator cuff repair    Referring Provider (PT) Valma Cava, MD                         [facility] Adult PT Treatment/Exercise - 04/19/20 0001      Shoulder Exercises: Standing   External Rotation Strengthening;Left;15 reps   x 2 sets   Theraband Level (Shoulder External Rotation) Level 3 (Green)    Internal Rotation Strengthening;15 reps;Left   x 2 sets   Theraband Level (Shoulder Internal Rotation) Level 3 (Green)    Flexion 15 reps   x 2 sets   Other Standing Exercises shelf reach, 1x15 on lower shelf 2 lbs, 2x15 on middle shelf 3lbs    Other Standing Exercises Scaption 2x15 AROM      Shoulder Exercises: Pulleys   Flexion 2 minutes    Scaption 2 minutes  Manual Therapy   Manual Therapy Passive ROM;Joint mobilization    Joint Mobilization GHJ mobs to reduce guarding; A/P and inferior mobs    Passive ROM Flexion, abduction, ER to tolerance                   PT Education - 04/19/20 0853    Education Details HEP, symptom management    Person(s) Educated Patient    Methods Explanation;Demonstration    Comprehension Verbalized understanding;Returned demonstration            PT Short Term Goals - 04/14/20 1036      PT SHORT TERM GOAL #1   Title He will be independent with initial HEP    Time 4    Period Weeks    Status Achieved      PT SHORT TERM GOAL #2   Title safely progress  into 4-6 weeks of protocol.    Time 4    Period Weeks    Status Achieved             PT Long Term Goals - 03/23/20 1139      PT LONG TERM GOAL #1   Title He will be indpendent with all hEP    Time 16    Period Weeks    Status On-going      PT LONG TERM  GOAL #2   Title He will be able to lift  25# or higher with 1-2 max pain to be able to return to work.    Time 16    Period Weeks    Status On-going      PT LONG TERM GOAL #3   Title He will have full ROM to be able to RTW safely    Time 16    Period Weeks    Status On-going      PT LONG TERM GOAL #4   Title He wil have pain max 1-2/10 with all normal home tasks    Time 16    Period Weeks    Status On-going      PT LONG TERM GOAL #5   Title Normal self care without compensation no pain    Time 16    Period Weeks    Status On-going      PT LONG TERM GOAL #6   Title FOTO score will improve to 31% limited from 68% limited    Time 16    Period Weeks    Status On-going                 Plan - 04/19/20 0854    Clinical Impression Statement Pt had no pain at the beginning of the session. Therapy worked on PROM and GH joint mobs to increase shoulder motion. Pt continues to be most limited in ER, measured at 60 degrees today. Therapy advised pt to continue stretching ROM at home. Therapy continues to progress shoulder strengthening. He demonstrates upper trap compensation during active shoulder flexion, but he was able to correct with verbal cues. Pt reported no pain at the end of the session and he tolerated treatment well.    Examination-Activity Limitations Reach Overhead;Hygiene/Grooming;Bathing;Lift    Examination-Participation Restrictions Cleaning;Community Activity;Driving;Yard Work    Stability/Clinical Decision Making Stable/Uncomplicated    Clinical Decision Making Low    Rehab Potential Good    PT Frequency 2x / week    PT Duration 12 weeks    PT Treatment/Interventions Passive range of motion;Manual techniques;Patient/family education;Therapeutic exercise;Cryotherapy;Vasopneumatic Device;Statistician  PT Next Visit Plan manual for ROM and pain within protocol limits, progress AAROM as tolerated, scap stabilization, review and progress HEP    PT Home  Exercise Plan Access Code: [email or web address]    Consulted and Agree with Plan of Care Patient           Patient will benefit from skilled therapeutic intervention in order to improve the following deficits and impairments:  Pain, Impaired UE functional use, Decreased activity tolerance, Decreased range of motion, Increased muscle spasms, Decreased strength  Visit Diagnosis: Acute pain of left shoulder  Stiffness of left shoulder, not elsewhere classified  Muscle weakness (generalized)  Localized edema  Cramp and spasm  Status post left rotator cuff repair     Problem List Patient Active Problem List   Diagnosis Date Noted  . Contusion 10/22/2019  . Zoster 12/28/2018  . Emphysema of lung (HCC) 01/02/2017  . Smoker 01/02/2017  . Rash and nonspecific skin eruption 12/07/2015  . HIV disease (HCC) 03/06/2010    Dessie Coma  PT DPT  04/19/2020, 9:57 AM   Lowell Bouton SPT   During this treatment session, the therapist was present, participating in and directing the treatment.    [facility] [address] [place], [place], [address] Phone: [phone]   Fax:  [phone]  Name: Jeremy Harper MRN: [id number] Date of Birth: [birth year]

## 2020-04-21 ENCOUNTER — Ambulatory Visit: Payer: 59 | Admitting: Physical Therapy

## 2020-04-21 ENCOUNTER — Encounter: Payer: Self-pay | Admitting: Physical Therapy

## 2020-04-21 ENCOUNTER — Other Ambulatory Visit: Payer: Self-pay

## 2020-04-21 DIAGNOSIS — R252 Cramp and spasm: Secondary | ICD-10-CM | POA: Diagnosis not present

## 2020-04-21 DIAGNOSIS — Z9889 Other specified postprocedural states: Secondary | ICD-10-CM | POA: Diagnosis not present

## 2020-04-21 DIAGNOSIS — M25612 Stiffness of left shoulder, not elsewhere classified: Secondary | ICD-10-CM

## 2020-04-21 DIAGNOSIS — R600 Localized edema: Secondary | ICD-10-CM | POA: Diagnosis not present

## 2020-04-21 DIAGNOSIS — M6281 Muscle weakness (generalized): Secondary | ICD-10-CM

## 2020-04-21 DIAGNOSIS — M25512 Pain in left shoulder: Secondary | ICD-10-CM | POA: Diagnosis not present

## 2020-04-21 NOTE — Therapy (Signed)
[facility] Outpatient Rehabilitation [facility] [address] [place], [place], [address] Phone: [phone]   Fax:  [phone]  Physical Therapy Treatment  Patient Details  Name: Jeremy Harper MRN: [id number] Date of Birth: Feb 10, 1981 Referring Provider (PT): Valma Cava, MD   Encounter Date: 04/21/2020   PT End of Session - 04/21/20 0726    Visit Number 22    Number of Visits 31    Date for PT Re-Evaluation 05/12/20    Authorization Type UMR    PT Start Time 0725   pt arrived late   PT Stop Time 0756    PT Time Calculation (min) 31 min    Activity Tolerance Patient tolerated treatment well    Behavior During Therapy [facility] for tasks assessed/performed           Past Medical History:  Diagnosis Date  . AIDS (HCC) 12/07/2015  . Emphysema of lung (HCC) 01/02/2017  . Rash and nonspecific skin eruption 12/07/2015  . Smoker 01/02/2017  . Thrush 12/07/2015  . Zoster 12/28/2018    Past Surgical History:  Procedure Laterality Date  . LAPAROSCOPY N/A 10/22/2019   Procedure: LAPAROSCOPY DIAGNOSTIC;  Surgeon: Diamantina Monks, MD;  [facility] OR;  Service: General;  Laterality: N/A;  . LAPAROTOMY N/A 10/22/2019   Procedure: EXPLORATORY LAPAROTOMY;  Surgeon: Diamantina Monks, MD;  [facility] OR;  Service: General;  Laterality: N/A;  . SMALL BOWEL REPAIR N/A 10/22/2019   Procedure: Small Bowel Repair;  Surgeon: Diamantina Monks, MD;  [facility] OR;  Service: General;  Laterality: N/A;    There were no vitals filed for this visit.   Subjective Assessment - 04/21/20 0725    Subjective Overall doing ok, no pain right now    Currently in Pain? No/denies    Aggravating Factors  reaching up    Pain Relieving Factors rest                             [facility] Adult PT Treatment/Exercise - 04/21/20 0001      Shoulder Exercises: Seated   Flexion AAROM;Strengthening;15 reps    Other Seated Exercises scaption AAROM x15      Shoulder Exercises:  Sidelying   Flexion Limitations to 90, cues for scap retraction    ABduction Limitations palm forward, to 90, focus scap retraction    Other Sidelying Exercises horizontal abduction      Shoulder Exercises: Standing   Other Standing Exercises OH lower trap set off of wall      Manual Therapy   Passive ROM all ranges to tolerance                    PT Short Term Goals - 04/14/20 1036      PT SHORT TERM GOAL #1   Title He will be independent with initial HEP    Time 4    Period Weeks    Status Achieved      PT SHORT TERM GOAL #2   Title safely progress  into 4-6 weeks of protocol.    Time 4    Period Weeks    Status Achieved             PT Long Term Goals - 03/23/20 1139      PT LONG TERM GOAL #1   Title He will be indpendent with all hEP    Time 16    Period Weeks  Status On-going      PT LONG TERM GOAL #2   Title He will be able to lift  25# or higher with 1-2 max pain to be able to return to work.    Time 16    Period Weeks    Status On-going      PT LONG TERM GOAL #3   Title He will have full ROM to be able to RTW safely    Time 16    Period Weeks    Status On-going      PT LONG TERM GOAL #4   Title He wil have pain max 1-2/10 with all normal home tasks    Time 16    Period Weeks    Status On-going      PT LONG TERM GOAL #5   Title Normal self care without compensation no pain    Time 16    Period Weeks    Status On-going      PT LONG TERM GOAL #6   Title FOTO score will improve to 31% limited from 68% limited    Time 16    Period Weeks    Status On-going                 Plan - 04/21/20 0756    Clinical Impression Statement Focused on scapular control today to decrease upper trap use. pt tolerated well and reported fatigue as expected. added sidelying exercises and lower trap setting to HEP.    PT Treatment/Interventions Passive range of motion;Manual techniques;Patient/family education;Therapeutic  exercise;Cryotherapy;Vasopneumatic Device;Electrical Stimulation    PT Next Visit Plan review/progress sidelying, full range scap control    PT Home Exercise Plan Access Code: [email or web address]    Consulted and Agree with Plan of Care Patient           Patient will benefit from skilled therapeutic intervention in order to improve the following deficits and impairments:  Pain, Impaired UE functional use, Decreased activity tolerance, Decreased range of motion, Increased muscle spasms, Decreased strength  Visit Diagnosis: Acute pain of left shoulder  Stiffness of left shoulder, not elsewhere classified  Muscle weakness (generalized)     Problem List Patient Active Problem List   Diagnosis Date Noted  . Contusion 10/22/2019  . Zoster 12/28/2018  . Emphysema of lung (HCC) 01/02/2017  . Smoker 01/02/2017  . Rash and nonspecific skin eruption 12/07/2015  . HIV disease (HCC) 03/06/2010   Tagen Milby C. Cythia Bachtel PT, DPT 04/21/20 7:59 AM   [facility] Outpatient Rehabilitation [facility] [address] [place], [place], [address] Phone: [phone]   Fax:  [phone]  Name: Jeremy Harper MRN: [id number] Date of Birth: March 09, 1981

## 2020-04-25 ENCOUNTER — Other Ambulatory Visit: Payer: Self-pay | Admitting: Infectious Disease

## 2020-04-25 ENCOUNTER — Other Ambulatory Visit: Payer: Self-pay

## 2020-04-25 DIAGNOSIS — B20 Human immunodeficiency virus [HIV] disease: Secondary | ICD-10-CM

## 2020-04-25 MED ORDER — TIVICAY 50 MG PO TABS: ORAL_TABLET | ORAL | 0 refills | Status: DC

## 2020-04-25 MED ORDER — DESCOVY 200-25 MG PO TABS: 1.0000 | ORAL_TABLET | Freq: Every day | ORAL | 0 refills | Status: DC

## 2020-04-25 MED FILL — DESCOVY 200-25 MG TABS: 200-25 | 30 days supply | Qty: 30 | Fill #0

## 2020-04-25 MED FILL — TIVICAY 50 MG TABLET: 50 | 30 days supply | Qty: 30 | Fill #11

## 2020-04-26 ENCOUNTER — Ambulatory Visit: Payer: 59 | Admitting: Physical Therapy

## 2020-04-26 ENCOUNTER — Encounter: Payer: Self-pay | Admitting: Physical Therapy

## 2020-04-26 ENCOUNTER — Other Ambulatory Visit: Payer: Self-pay

## 2020-04-26 DIAGNOSIS — M6281 Muscle weakness (generalized): Secondary | ICD-10-CM

## 2020-04-26 DIAGNOSIS — M25612 Stiffness of left shoulder, not elsewhere classified: Secondary | ICD-10-CM | POA: Diagnosis not present

## 2020-04-26 DIAGNOSIS — R600 Localized edema: Secondary | ICD-10-CM

## 2020-04-26 DIAGNOSIS — R252 Cramp and spasm: Secondary | ICD-10-CM

## 2020-04-26 DIAGNOSIS — M25512 Pain in left shoulder: Secondary | ICD-10-CM

## 2020-04-26 DIAGNOSIS — Z9889 Other specified postprocedural states: Secondary | ICD-10-CM

## 2020-04-26 NOTE — Therapy (Signed)
[facility] Outpatient Rehabilitation [facility] [address] [place], [place], [address] Phone: [phone]   Fax:  [phone]  Physical Therapy Treatment  Patient Details  Name: Jeremy Harper MRN: [id number] Date of Birth: 1981-06-23 Referring Provider (PT): Valma Cava, MD   Encounter Date: 04/26/2020   PT End of Session - 04/26/20 0813    Visit Number 23    Number of Visits 31    Date for PT Re-Evaluation 05/12/20    Authorization Type UMR    PT Start Time 0806    PT Stop Time 0845    PT Time Calculation (min) 39 min    Activity Tolerance Patient tolerated treatment well    Behavior During Therapy [facility] for tasks assessed/performed           Past Medical History:  Diagnosis Date  . AIDS (HCC) 12/07/2015  . Emphysema of lung (HCC) 01/02/2017  . Rash and nonspecific skin eruption 12/07/2015  . Smoker 01/02/2017  . Thrush 12/07/2015  . Zoster 12/28/2018    Past Surgical History:  Procedure Laterality Date  . LAPAROSCOPY N/A 10/22/2019   Procedure: LAPAROSCOPY DIAGNOSTIC;  Surgeon: Diamantina Monks, MD;  Location: [facility] OR;  Service: General;  Laterality: N/A;  . LAPAROTOMY N/A 10/22/2019   Procedure: EXPLORATORY LAPAROTOMY;  Surgeon: Diamantina Monks, MD;  Location: [facility] OR;  Service: General;  Laterality: N/A;  . SMALL BOWEL REPAIR N/A 10/22/2019   Procedure: Small Bowel Repair;  Surgeon: Diamantina Monks, MD;  Location: [facility] OR;  Service: General;  Laterality: N/A;    There were no vitals filed for this visit.   Subjective Assessment - 04/26/20 0811    Subjective Patient reports overall he is doing well.  He has had no pain. He has been working on his exercses    Limitations Lifting;House hold activities    Patient Stated Goals To return to using LT arm for normal self care and home and work tasks.    Currently in Pain? No/denies                             [facility] Adult PT Treatment/Exercise - 04/26/20 0001      Shoulder Exercises:  Standing   External Rotation Strengthening;Left;15 reps   x 2 sets   Theraband Level (Shoulder External Rotation) Level 3 (Green)    Internal Rotation Strengthening;15 reps;Left   x 2 sets   Theraband Level (Shoulder Internal Rotation) Level 3 (Green)    Shoulder Flexion Weight (lbs) 2lb x20     Other Standing Exercises shelf reach x20 1lb ; towel v wall cw/ccw. x15 up/down x15 side to side x15     Other Standing Exercises scaption x20 2lb       Shoulder Exercises: ROM/Strengthening   UBE (Upper Arm Bike) 60fwd 2 back     Other ROM/Strengthening Exercises Finger wall crawl in flex & abd 3 reps       Manual Therapy   Manual Therapy Passive ROM;Joint mobilization    Joint Mobilization GHJ mobs to reduce guarding; A/P and inferior mobs    Passive ROM Flexion, abduction, ER to tolerance                   PT Education - 04/26/20 0812    Education Details reviewed HEp and symptom mangement    Person(s) Educated Patient    Methods Explanation;Demonstration;Tactile cues;Verbal cues    Comprehension Verbalized understanding;Returned demonstration;Verbal cues required;Tactile  cues required            PT Short Term Goals - 04/14/20 1036      PT SHORT TERM GOAL #1   Title He will be independent with initial HEP    Time 4    Period Weeks    Status Achieved      PT SHORT TERM GOAL #2   Title safely progress  into 4-6 weeks of protocol.    Time 4    Period Weeks    Status Achieved             PT Long Term Goals - 03/23/20 1139      PT LONG TERM GOAL #1   Title He will be indpendent with all hEP    Time 16    Period Weeks    Status On-going      PT LONG TERM GOAL #2   Title He will be able to lift  25# or higher with 1-2 max pain to be able to return to work.    Time 16    Period Weeks    Status On-going      PT LONG TERM GOAL #3   Title He will have full ROM to be able to RTW safely    Time 16    Period Weeks    Status On-going      PT LONG TERM GOAL #4    Title He wil have pain max 1-2/10 with all normal home tasks    Time 16    Period Weeks    Status On-going      PT LONG TERM GOAL #5   Title Normal self care without compensation no pain    Time 16    Period Weeks    Status On-going      PT LONG TERM GOAL #6   Title FOTO score will improve to 31% limited from 68% limited    Time 16    Period Weeks    Status On-going                 Plan - 04/26/20 2951    Clinical Impression Statement Patient tolerated ther-ex well. Therpay worked on [employer] and endurance exercises. he was fiatuged but was able to complet the task. His ER has improved significanlty. therapy will continue to advance the patient as tolerated and move him towards work task .    Examination-Activity Limitations Reach Overhead;Hygiene/Grooming;Bathing;Lift    Examination-Participation Restrictions Cleaning;Community Activity;Driving;Yard Work    Rehab Potential Good    PT Frequency 2x / week    PT Duration 12 weeks    PT Treatment/Interventions Passive range of motion;Manual techniques;Patient/family education;Therapeutic exercise;Cryotherapy;Vasopneumatic Device;Electrical Stimulation    PT Next Visit Plan continue to progress functional ther-ex as tolerated    PT Home Exercise Plan Access Code: [email or web address]    Consulted and Agree with Plan of Care Patient           Patient will benefit from skilled therapeutic intervention in order to improve the following deficits and impairments:  Pain, Impaired UE functional use, Decreased activity tolerance, Decreased range of motion, Increased muscle spasms, Decreased strength  Visit Diagnosis: Acute pain of left shoulder  Stiffness of left shoulder, not elsewhere classified  Muscle weakness (generalized)  Localized edema  Cramp and spasm  Status post left rotator cuff repair     Problem List Patient Active Problem List   Diagnosis Date Noted  . Contusion 10/22/2019  . Zoster  12/28/2018  .  Emphysema of lung (HCC) 01/02/2017  . Smoker 01/02/2017  . Rash and nonspecific skin eruption 12/07/2015  . HIV disease (HCC) 03/06/2010    Dessie Coma PT DPT  04/26/2020, 2:56 PM  [facility] [address] [place], [place], [address] Phone: [phone]   Fax:  [phone]  Name: Jeremy Harper MRN: [id number] Date of Birth: 1981/03/21

## 2020-04-27 ENCOUNTER — Other Ambulatory Visit: Payer: 59

## 2020-04-27 DIAGNOSIS — J430 Unilateral pulmonary emphysema [MacLeod's syndrome]: Secondary | ICD-10-CM | POA: Diagnosis not present

## 2020-04-27 DIAGNOSIS — B20 Human immunodeficiency virus [HIV] disease: Secondary | ICD-10-CM | POA: Diagnosis not present

## 2020-04-27 DIAGNOSIS — F17200 Nicotine dependence, unspecified, uncomplicated: Secondary | ICD-10-CM | POA: Diagnosis not present

## 2020-04-27 NOTE — Addendum Note (Signed)
Addended by: Eula Mazzola D on: 04/27/2020 09:07 AM   Modules accepted: Orders  

## 2020-04-27 NOTE — Addendum Note (Signed)
Addended by: Mariea Clonts D on: 04/27/2020 09:07 AM   Modules accepted: Orders

## 2020-04-28 LAB — T-HELPER CELL (CD4) - (RCID CLINIC ONLY)
CD4 % Helper T Cell: 19 % — ABNORMAL LOW (ref 33–65)
CD4 T Cell Abs: 446 /uL (ref 400–1790)

## 2020-04-29 ENCOUNTER — Other Ambulatory Visit: Payer: Self-pay

## 2020-04-29 ENCOUNTER — Encounter: Payer: Self-pay | Admitting: Physical Therapy

## 2020-04-29 ENCOUNTER — Ambulatory Visit: Payer: 59 | Admitting: Physical Therapy

## 2020-04-29 DIAGNOSIS — M25512 Pain in left shoulder: Secondary | ICD-10-CM | POA: Diagnosis not present

## 2020-04-29 DIAGNOSIS — Z9889 Other specified postprocedural states: Secondary | ICD-10-CM | POA: Diagnosis not present

## 2020-04-29 DIAGNOSIS — R252 Cramp and spasm: Secondary | ICD-10-CM | POA: Diagnosis not present

## 2020-04-29 DIAGNOSIS — R600 Localized edema: Secondary | ICD-10-CM | POA: Diagnosis not present

## 2020-04-29 DIAGNOSIS — M25612 Stiffness of left shoulder, not elsewhere classified: Secondary | ICD-10-CM

## 2020-04-29 DIAGNOSIS — M6281 Muscle weakness (generalized): Secondary | ICD-10-CM | POA: Diagnosis not present

## 2020-04-29 NOTE — Therapy (Signed)
[facility] Outpatient Rehabilitation [facility] [address] [place], [place], [address] Phone: [phone]   Fax:  [phone]  Physical Therapy Treatment  Patient Details  Name: Jeremy Harper MRN: [id number] Date of Birth: 05-05-1981 Referring Provider (PT): Valma Cava, MD   Encounter Date: 04/29/2020   PT End of Session - 04/29/20 0957    Visit Number 24    Number of Visits 31    Date for PT Re-Evaluation 05/12/20    Authorization Type UMR    PT Start Time [phone]   pt arrived late   PT Stop Time 1025    PT Time Calculation (min) 30 min    Activity Tolerance Patient tolerated treatment well    Behavior During Therapy [facility] for tasks assessed/performed           Past Medical History:  Diagnosis Date  . AIDS (HCC) 12/07/2015  . Emphysema of lung (HCC) 01/02/2017  . Rash and nonspecific skin eruption 12/07/2015  . Smoker 01/02/2017  . Thrush 12/07/2015  . Zoster 12/28/2018    Past Surgical History:  Procedure Laterality Date  . LAPAROSCOPY N/A 10/22/2019   Procedure: LAPAROSCOPY DIAGNOSTIC;  Surgeon: Diamantina Monks, MD;  [facility]: [facility] OR;  Service: General;  Laterality: N/A;  . LAPAROTOMY N/A 10/22/2019   Procedure: EXPLORATORY LAPAROTOMY;  Surgeon: Diamantina Monks, MD;  Location: [facility] OR;  Service: General;  Laterality: N/A;  . SMALL BOWEL REPAIR N/A 10/22/2019   Procedure: Small Bowel Repair;  Surgeon: Diamantina Monks, MD;  [facility]: [facility] OR;  Service: General;  Laterality: N/A;    There were no vitals filed for this visit.   Subjective Assessment - 04/29/20 0956    Subjective A little extra ache the last 2 days, no known cause. I can reach higher and higher, almost all the way. Denies HA & radicular symptoms    Patient Stated Goals To return to using LT arm for normal self care and home and work tasks.    Currently in Pain? No/denies              [facility] PT Assessment - 04/29/20 0001      PROM   Right/Left Shoulder Left    Left Shoulder Flexion  155 Degrees    Left Shoulder ABduction 150 Degrees                         [facility] Adult PT Treatment/Exercise - 04/29/20 0001      Shoulder Exercises: Seated   Flexion Limitations AAROM to full range, eccentric lower without assist      Shoulder Exercises: Prone   Flexion 10 reps    Horizontal ABduction 1 10 reps;Left    Horizontal ABduction 1 Limitations palm down    Horizontal ABduction 2 10 reps;Left    Horizontal ABduction 2 Limitations thumb up    Other Prone Exercises triceps kicks 2lb      Shoulder Exercises: Standing   Other Standing Exercises low trap set off of wall      Shoulder Exercises: ROM/Strengthening   UBE (Upper Arm Bike) 3 back L2      Manual Therapy   Soft tissue mobilization subscap    Passive ROM flexion, abduction                    PT Short Term Goals - 04/14/20 1036      PT SHORT TERM GOAL #1   Title He will be independent with  initial HEP    Time 4    Period Weeks    Status Achieved      PT SHORT TERM GOAL #2   Title safely progress  into 4-6 weeks of protocol.    Time 4    Period Weeks    Status Achieved             PT Long Term Goals - 03/23/20 1139      PT LONG TERM GOAL #1   Title He will be indpendent with all hEP    Time 16    Period Weeks    Status On-going      PT LONG TERM GOAL #2   Title He will be able to lift  25# or higher with 1-2 max pain to be able to return to work.    Time 16    Period Weeks    Status On-going      PT LONG TERM GOAL #3   Title He will have full ROM to be able to RTW safely    Time 16    Period Weeks    Status On-going      PT LONG TERM GOAL #4   Title He wil have pain max 1-2/10 with all normal home tasks    Time 16    Period Weeks    Status On-going      PT LONG TERM GOAL #5   Title Normal self care without compensation no pain    Time 16    Period Weeks    Status On-going      PT LONG TERM GOAL #6   Title FOTO score will improve to 31% limited from  68% limited    Time 16    Period Weeks    Status On-going                 Plan - 04/29/20 1020    Clinical Impression Statement progressed sidelying exercises to prone and added to HEP. well tolerated with fatigue as expected.    PT Treatment/Interventions Passive range of motion;Manual techniques;Patient/family education;Therapeutic exercise;Cryotherapy;Vasopneumatic Device;Electrical Stimulation    PT Next Visit Plan 25th visit= measure/goals for eligibility check, progress resistance/weight into full avail range    PT Home Exercise Plan Access Code: [email or web address]    Consulted and Agree with Plan of Care Patient           Patient will benefit from skilled therapeutic intervention in order to improve the following deficits and impairments:  Pain, Impaired UE functional use, Decreased activity tolerance, Decreased range of motion, Increased muscle spasms, Decreased strength  Visit Diagnosis: Acute pain of left shoulder  Stiffness of left shoulder, not elsewhere classified  Muscle weakness (generalized)     Problem List Patient Active Problem List   Diagnosis Date Noted  . Contusion 10/22/2019  . Zoster 12/28/2018  . Emphysema of lung (HCC) 01/02/2017  . Smoker 01/02/2017  . Rash and nonspecific skin eruption 12/07/2015  . HIV disease (HCC) 03/06/2010    Page Lancon C. Amadi Frady PT, DPT 04/29/20 10:27 AM   [facility] [address] [place], [place], [address] Phone: [phone]   Fax:  [phone]  Name: GRAEDEN BITNER MRN: [id number] Date of Birth: 1981/02/26

## 2020-04-30 LAB — CBC WITH DIFFERENTIAL/PLATELET
Absolute Monocytes: 452 cells/uL (ref 200–950)
Basophils Absolute: 52 cells/uL (ref 0–200)
Basophils Relative: 0.9 %
Eosinophils Absolute: 313 cells/uL (ref 15–500)
Eosinophils Relative: 5.4 %
HCT: 41.3 % (ref 38.5–50.0)
Hemoglobin: 14.2 g/dL (ref 13.2–17.1)
Lymphs Abs: 2419 cells/uL (ref 850–3900)
MCH: 31.8 pg (ref 27.0–33.0)
MCHC: 34.4 g/dL (ref 32.0–36.0)
MCV: 92.6 fL (ref 80.0–100.0)
MPV: 10.9 fL (ref 7.5–12.5)
Monocytes Relative: 7.8 %
Neutro Abs: 2564 cells/uL (ref 1500–7800)
Neutrophils Relative %: 44.2 %
Platelets: 224 10*3/uL (ref 140–400)
RBC: 4.46 10*6/uL (ref 4.20–5.80)
RDW: 13.2 % (ref 11.0–15.0)
Total Lymphocyte: 41.7 %
WBC: 5.8 10*3/uL (ref 3.8–10.8)

## 2020-04-30 LAB — COMPLETE METABOLIC PANEL WITH GFR
AG Ratio: 1.4 (calc) (ref 1.0–2.5)
ALT: 13 U/L (ref 9–46)
AST: 15 U/L (ref 10–40)
Albumin: 4.3 g/dL (ref 3.6–5.1)
Alkaline phosphatase (APISO): 103 U/L (ref 36–130)
BUN: 11 mg/dL (ref 7–25)
CO2: 24 mmol/L (ref 20–32)
Calcium: 9.1 mg/dL (ref 8.6–10.3)
Chloride: 108 mmol/L (ref 98–110)
Creat: 1.06 mg/dL (ref 0.60–1.35)
GFR, Est African American: 104 mL/min/{1.73_m2} (ref >=60)
GFR, Est Non African American: 90 mL/min/{1.73_m2} (ref >=60)
Globulin: 3 g/dL (calc) (ref 1.9–3.7)
Glucose, Bld: 117 mg/dL — ABNORMAL HIGH (ref 65–99)
Potassium: 3.9 mmol/L (ref 3.5–5.3)
Sodium: 139 mmol/L (ref 135–146)
Total Bilirubin: 0.5 mg/dL (ref 0.2–1.2)
Total Protein: 7.3 g/dL (ref 6.1–8.1)

## 2020-04-30 LAB — HIV-1 RNA QUANT-NO REFLEX-BLD
HIV 1 RNA Quant: 55 Copies/mL — ABNORMAL HIGH
HIV-1 RNA Quant, Log: 1.74 Log cps/mL — ABNORMAL HIGH

## 2020-04-30 LAB — RPR: RPR Ser Ql: NONREACTIVE

## 2020-05-01 ENCOUNTER — Encounter: Payer: Self-pay | Admitting: Physical Therapy

## 2020-05-01 ENCOUNTER — Other Ambulatory Visit: Payer: Self-pay

## 2020-05-01 ENCOUNTER — Ambulatory Visit: Payer: 59 | Admitting: Physical Therapy

## 2020-05-01 DIAGNOSIS — Z9889 Other specified postprocedural states: Secondary | ICD-10-CM | POA: Diagnosis not present

## 2020-05-01 DIAGNOSIS — M25512 Pain in left shoulder: Secondary | ICD-10-CM | POA: Diagnosis not present

## 2020-05-01 DIAGNOSIS — R252 Cramp and spasm: Secondary | ICD-10-CM

## 2020-05-01 DIAGNOSIS — M25612 Stiffness of left shoulder, not elsewhere classified: Secondary | ICD-10-CM

## 2020-05-01 DIAGNOSIS — R600 Localized edema: Secondary | ICD-10-CM | POA: Diagnosis not present

## 2020-05-01 DIAGNOSIS — M6281 Muscle weakness (generalized): Secondary | ICD-10-CM | POA: Diagnosis not present

## 2020-05-01 NOTE — Therapy (Signed)
[facility] [address] [place], [place], [address] Phone: [phone]   Fax:  [phone]  Physical Therapy Treatment  Patient Details  Name: Jeremy Harper MRN: [id number] Date of Birth: Dec 18, 1980 Referring Provider (PT): Valma Cava, MD   Encounter Date: 05/01/2020   PT End of Session - 05/01/20 0922    Visit Number 25    Number of Visits 31    Date for PT Re-Evaluation 05/12/20    Authorization Type UMR    PT Start Time 0845    PT Stop Time 0930    PT Time Calculation (min) 45 min           Past Medical History:  Diagnosis Date  . AIDS (HCC) 12/07/2015  . Emphysema of lung (HCC) 01/02/2017  . Rash and nonspecific skin eruption 12/07/2015  . Smoker 01/02/2017  . Thrush 12/07/2015  . Zoster 12/28/2018    Past Surgical History:  Procedure Laterality Date  . LAPAROSCOPY N/A 10/22/2019   Procedure: LAPAROSCOPY DIAGNOSTIC;  Surgeon: Diamantina Monks, MD;  Location: [facility] OR;  Service: General;  Laterality: N/A;  . LAPAROTOMY N/A 10/22/2019   Procedure: EXPLORATORY LAPAROTOMY;  Surgeon: Diamantina Monks, MD;  Location: [facility] OR;  Service: General;  Laterality: N/A;  . SMALL BOWEL REPAIR N/A 10/22/2019   Procedure: Small Bowel Repair;  Surgeon: Diamantina Monks, MD;  Location: [facility] OR;  Service: General;  Laterality: N/A;    There were no vitals filed for this visit.   Subjective Assessment - 05/01/20 0851    Subjective No pain. Shoulder is feeling alot better.    Currently in Pain? No/denies              [facility] PT Assessment - 05/01/20 0001      PROM   Right Shoulder External Rotation 70 Degrees    Left Shoulder Flexion 155 Degrees    Left Shoulder ABduction 150 Degrees                         [facility] Adult PT Treatment/Exercise - 05/01/20 0001      Shoulder Exercises: Supine   Flexion 10 reps   3 sets   Shoulder Flexion Weight (lbs) 2      Shoulder Exercises: Prone   Flexion 10 reps   3 sets 2#    Horizontal ABduction 1 10 reps;Left   2# 3 sets   Horizontal ABduction 1 Limitations palm down    Horizontal ABduction 2 10 reps;Left   2# 3 sets   Horizontal ABduction 2 Limitations thumb up    Other Prone Exercises triceps kicks 2lb      Shoulder Exercises: Sidelying   External Rotation 20 reps   AROM, x10 with 1#   ABduction 10 reps   3 sets, AROM     Shoulder Exercises: Standing   Other Standing Exercises low trap set off of wall 10x 3       Shoulder Exercises: ROM/Strengthening   UBE (Upper Arm Bike) 3 back L2      Manual Therapy   Passive ROM Flexion, abduction, ER to tolerance                     PT Short Term Goals - 04/14/20 1036      PT SHORT TERM GOAL #1   Title He will be independent with initial HEP    Time 4    Period Weeks  Status Achieved      PT SHORT TERM GOAL #2   Title safely progress  into 4-6 weeks of protocol.    Time 4    Period Weeks    Status Achieved             PT Long Term Goals - 03/23/20 1139      PT LONG TERM GOAL #1   Title He will be indpendent with all hEP    Time 16    Period Weeks    Status On-going      PT LONG TERM GOAL #2   Title He will be able to lift  25# or higher with 1-2 max pain to be able to return to work.    Time 16    Period Weeks    Status On-going      PT LONG TERM GOAL #3   Title He will have full ROM to be able to RTW safely    Time 16    Period Weeks    Status On-going      PT LONG TERM GOAL #4   Title He wil have pain max 1-2/10 with all normal home tasks    Time 16    Period Weeks    Status On-going      PT LONG TERM GOAL #5   Title Normal self care without compensation no pain    Time 16    Period Weeks    Status On-going      PT LONG TERM GOAL #6   Title FOTO score will improve to 31% limited from 68% limited    Time 16    Period Weeks    Status On-going                 Plan - 05/01/20 8889    Clinical Impression Statement Pt arrived reporting no pain.  Continued with prone shoulder/scap strength and sidelying RTC strength. Fatigue with reps but overall tolerated the session well. Will need eligibility check next session, check goals.    PT Next Visit Plan 25th visit= measure/goals for eligibility check, progress resistance/weight into full avail range    PT Home Exercise Plan Access Code: [email or web address]           Patient will benefit from skilled therapeutic intervention in order to improve the following deficits and impairments:  Pain, Impaired UE functional use, Decreased activity tolerance, Decreased range of motion, Increased muscle spasms, Decreased strength  Visit Diagnosis: Acute pain of left shoulder  Stiffness of left shoulder, not elsewhere classified  Muscle weakness (generalized)  Localized edema  Cramp and spasm  Status post left rotator cuff repair     Problem List Patient Active Problem List   Diagnosis Date Noted  . Contusion 10/22/2019  . Zoster 12/28/2018  . Emphysema of lung (HCC) 01/02/2017  . Smoker 01/02/2017  . Rash and nonspecific skin eruption 12/07/2015  . HIV disease (HCC) 03/06/2010    Sherrie Mustache, PTA 05/01/2020, 9:46 AM  [facility] [address] [place], [place], [address] Phone: [phone]   Fax:  [phone]  Name: Jeremy Harper MRN: [id number] Date of Birth: Sep 27, 1980

## 2020-05-03 ENCOUNTER — Ambulatory Visit: Payer: 59 | Admitting: Physical Therapy

## 2020-05-03 DIAGNOSIS — Z4789 Encounter for other orthopedic aftercare: Secondary | ICD-10-CM | POA: Diagnosis not present

## 2020-05-04 ENCOUNTER — Ambulatory Visit: Payer: 59 | Admitting: Physical Therapy

## 2020-05-04 ENCOUNTER — Encounter: Payer: Self-pay | Admitting: Physical Therapy

## 2020-05-04 ENCOUNTER — Other Ambulatory Visit: Payer: Self-pay

## 2020-05-04 DIAGNOSIS — M6281 Muscle weakness (generalized): Secondary | ICD-10-CM

## 2020-05-04 DIAGNOSIS — M25612 Stiffness of left shoulder, not elsewhere classified: Secondary | ICD-10-CM

## 2020-05-04 DIAGNOSIS — R252 Cramp and spasm: Secondary | ICD-10-CM

## 2020-05-04 DIAGNOSIS — Z9889 Other specified postprocedural states: Secondary | ICD-10-CM

## 2020-05-04 DIAGNOSIS — M25512 Pain in left shoulder: Secondary | ICD-10-CM

## 2020-05-04 DIAGNOSIS — R600 Localized edema: Secondary | ICD-10-CM

## 2020-05-04 NOTE — Therapy (Signed)
[facility] [address] [place], [place], [address] Phone: [phone]   Fax:  [phone]  Physical Therapy Treatment  Patient Details  Name: Jeremy Harper MRN: [id number] Date of Birth: 17-Mar-1981 Referring Provider (PT): Valma Cava, MD   Encounter Date: 05/04/2020   PT End of Session - 05/04/20 1109    Visit Number 26    Number of Visits 31    Date for PT Re-Evaluation 05/12/20    Authorization Type UMR    PT Start Time 1102    PT Stop Time 1143    PT Time Calculation (min) 41 min    Activity Tolerance Patient tolerated treatment well    Behavior During Therapy [facility] for tasks assessed/performed           Past Medical History:  Diagnosis Date   AIDS (HCC) 12/07/2015   Emphysema of lung (HCC) 01/02/2017   Rash and nonspecific skin eruption 12/07/2015   Smoker 01/02/2017   Thrush 12/07/2015   Zoster 12/28/2018    Past Surgical History:  Procedure Laterality Date   LAPAROSCOPY N/A 10/22/2019   Procedure: LAPAROSCOPY DIAGNOSTIC;  Surgeon: Diamantina Monks, MD;  Location: [facility] OR;  Service: General;  Laterality: N/A;   LAPAROTOMY N/A 10/22/2019   Procedure: EXPLORATORY LAPAROTOMY;  Surgeon: Diamantina Monks, MD;  Location: [facility] OR;  Service: General;  Laterality: N/A;   SMALL BOWEL REPAIR N/A 10/22/2019   Procedure: Small Bowel Repair;  Surgeon: Diamantina Monks, MD;  Location: [facility] OR;  Service: General;  Laterality: N/A;    There were no vitals filed for this visit.   Subjective Assessment - 05/04/20 1108    Subjective Patient continues to have no pain. He went to the MD. He went and saw the doc. He is out for 6 more weeks.    Limitations Lifting;House hold activities    Patient Stated Goals To return to using LT arm for normal self care and home and work tasks.    Currently in Pain? No/denies                             [facility] Adult PT Treatment/Exercise - 05/04/20 0001      Shoulder  Exercises: Supine   Other Supine Exercises wand flexion 3lb 2x10       Shoulder Exercises: Prone   Flexion Limitations x20 in available range     Other Prone Exercises row 20x 2lb; extension x20 2lb       Shoulder Exercises: Sidelying   External Rotation 20 reps   AROM, x10 with 1#     Shoulder Exercises: Standing   Other Standing Exercises bilateral shoulder flexion x15 2lb pain flet; standing Y 2x15 no weight     Other Standing Exercises box lift from floor 2x15; box lift from chair to counter 2x15;       Shoulder Exercises: ROM/Strengthening   UBE (Upper Arm Bike) 3 back L2    Lat Pull Limitations 2x15 25lbs     Cybex Row Limitations 2x15 20 lbs                   PT Education - 05/04/20 1109    Education Details reviewed functional strengthening exercsies    Person(s) Educated Patient    Methods Explanation;Demonstration;Tactile cues;Verbal cues    Comprehension Verbalized understanding;Returned demonstration;Verbal cues required;Tactile cues required            PT Short Term  Goals - 05/04/20 1611      PT SHORT TERM GOAL #1   Title He will be independent with initial HEP    Time 4    Period Weeks    Status Achieved      PT SHORT TERM GOAL #2   Title safely progress  into 4-6 weeks of protocol.    Time 4    Period Weeks    Status Achieved      PT SHORT TERM GOAL #3   Title No increased pain    Time 4    Period Weeks    Status Achieved             PT Long Term Goals - 03/23/20 1139      PT LONG TERM GOAL #1   Title He will be indpendent with all hEP    Time 16    Period Weeks    Status On-going      PT LONG TERM GOAL #2   Title He will be able to lift  25# or higher with 1-2 max pain to be able to return to work.    Time 16    Period Weeks    Status On-going      PT LONG TERM GOAL #3   Title He will have full ROM to be able to RTW safely    Time 16    Period Weeks    Status On-going      PT LONG TERM GOAL #4   Title He wil have  pain max 1-2/10 with all normal home tasks    Time 16    Period Weeks    Status On-going      PT LONG TERM GOAL #5   Title Normal self care without compensation no pain    Time 16    Period Weeks    Status On-going      PT LONG TERM GOAL #6   Title FOTO score will improve to 31% limited from 68% limited    Time 16    Period Weeks    Status On-going                 Plan - 05/04/20 1606    Clinical Impression Statement Patient had some pain with forward flexion. He had no pain when he did a standing Y without weight. Patient began box lifting from the ground and raised to go to a counter.    Examination-Activity Limitations Reach Overhead;Hygiene/Grooming;Bathing;Lift    Examination-Participation Restrictions Cleaning;Community Activity;Driving;Yard Work    Stability/Clinical Decision Making Stable/Uncomplicated    Clinical Decision Making Low    Rehab Potential Good    PT Frequency 2x / week    PT Duration 12 weeks    PT Treatment/Interventions Passive range of motion;Manual techniques;Patient/family education;Therapeutic exercise;Cryotherapy;Vasopneumatic Device;Electrical Stimulation    PT Next Visit Plan 25th visit= measure/goals for eligibility check, progress resistance/weight into full avail range    PT Home Exercise Plan Access Code: [email or web address]    Consulted and Agree with Plan of Care Patient           Patient will benefit from skilled therapeutic intervention in order to improve the following deficits and impairments:  Pain, Impaired UE functional use, Decreased activity tolerance, Decreased range of motion, Increased muscle spasms, Decreased strength  Visit Diagnosis: Acute pain of left shoulder  Stiffness of left shoulder, not elsewhere classified  Muscle weakness (generalized)  Cramp and spasm  Status post left rotator cuff repair  Localized edema     Problem List Patient Active Problem List   Diagnosis Date Noted   Contusion 10/22/2019    Zoster 12/28/2018   Emphysema of lung (HCC) 01/02/2017   Smoker 01/02/2017   Rash and nonspecific skin eruption 12/07/2015   HIV disease (HCC) 03/06/2010    Dessie Coma PT DPT 05/04/2020, 4:17 PM  [facility] [address] [place], [place], [address] Phone: [phone]   Fax:  [phone]  Name: JANOAH MENNA MRN: [id number] Date of Birth: 06/13/1981

## 2020-05-09 ENCOUNTER — Encounter: Payer: Self-pay | Admitting: Infectious Disease

## 2020-05-09 ENCOUNTER — Other Ambulatory Visit: Payer: Self-pay

## 2020-05-09 ENCOUNTER — Ambulatory Visit (INDEPENDENT_AMBULATORY_CARE_PROVIDER_SITE_OTHER): Payer: 59 | Admitting: Infectious Disease

## 2020-05-09 VITALS — BP 115/73 | HR 80 | Temp 98.6°F | Wt 183.0 lb

## 2020-05-09 DIAGNOSIS — B20 Human immunodeficiency virus [HIV] disease: Secondary | ICD-10-CM

## 2020-05-09 DIAGNOSIS — F17200 Nicotine dependence, unspecified, uncomplicated: Secondary | ICD-10-CM

## 2020-05-09 DIAGNOSIS — Z7189 Other specified counseling: Secondary | ICD-10-CM

## 2020-05-09 DIAGNOSIS — Z7185 Encounter for immunization safety counseling: Secondary | ICD-10-CM
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
## 2020-05-10 ENCOUNTER — Ambulatory Visit: Payer: 59 | Admitting: Physical Therapy

## 2020-05-11 ENCOUNTER — Ambulatory Visit: Payer: 59 | Admitting: Physical Therapy

## 2020-05-12 ENCOUNTER — Ambulatory Visit: Payer: 59 | Attending: Specialist | Admitting: Physical Therapy

## 2020-05-12 ENCOUNTER — Other Ambulatory Visit: Payer: Self-pay

## 2020-05-12 ENCOUNTER — Encounter: Payer: Self-pay | Admitting: Physical Therapy

## 2020-05-12 ENCOUNTER — Ambulatory Visit: Payer: 59 | Admitting: Physical Therapy

## 2020-05-12 DIAGNOSIS — R252 Cramp and spasm: Secondary | ICD-10-CM | POA: Insufficient documentation

## 2020-05-12 DIAGNOSIS — M25512 Pain in left shoulder: Secondary | ICD-10-CM | POA: Insufficient documentation

## 2020-05-12 DIAGNOSIS — M6281 Muscle weakness (generalized): Secondary | ICD-10-CM | POA: Diagnosis not present

## 2020-05-12 DIAGNOSIS — M25612 Stiffness of left shoulder, not elsewhere classified: Secondary | ICD-10-CM | POA: Diagnosis not present

## 2020-05-12 DIAGNOSIS — R600 Localized edema: Secondary | ICD-10-CM | POA: Insufficient documentation

## 2020-05-12 DIAGNOSIS — Z9889 Other specified postprocedural states: Secondary | ICD-10-CM | POA: Insufficient documentation

## 2020-05-12 NOTE — Therapy (Signed)
[facility] [place], [place], [address] Phone: [phone]   Fax:  [phone]  Physical Therapy Treatment/Discharge   Patient Details  Name: Jeremy Harper MRN: [id number] Date of Birth: [birth year] Referring Provider (PT): Hart Robinsons, MD   Encounter Date: 05/12/2020   PT End of Session - 05/12/20 1033    Visit Number 27    Number of Visits 31    Date for PT Re-Evaluation 05/12/20    Authorization Type UMR    PT Start Time 1018    PT Stop Time 1100    PT Time Calculation (min) 42 min    Activity Tolerance Patient tolerated treatment well    Behavior During Therapy [facility] for tasks assessed/performed           Past Medical History:  Diagnosis Date  . AIDS ([place]) 12/07/2015  . Emphysema of lung ([place]) 01/02/2017  . Rash and nonspecific skin eruption 12/07/2015  . Smoker 01/02/2017  . Thrush 12/07/2015  . Zoster 12/28/2018    Past Surgical History:  Procedure Laterality Date  . LAPAROSCOPY N/A 10/22/2019   Procedure: LAPAROSCOPY DIAGNOSTIC;  Surgeon: Jesusita Oka, MD;  Location: [place];  Service: General;  Laterality: N/A;  . LAPAROTOMY N/A 10/22/2019   Procedure: EXPLORATORY LAPAROTOMY;  Surgeon: Jesusita Oka, MD;  Location: [place];  Service: General;  Laterality: N/A;  . SMALL BOWEL REPAIR N/A 10/22/2019   Procedure: Small Bowel Repair;  Surgeon: Jesusita Oka, MD;  Location: [place];  Service: General;  Laterality: N/A;    There were no vitals filed for this visit.   Subjective Assessment - 05/12/20 1031    Subjective Patient reports a little soreness when he reaches overhead. Overall he feels like his biggest problem is doing overhead activity. He has reached his visit limit for therapy.    Limitations Lifting;House hold activities    Patient Stated Goals To return to using LT arm for normal self care and home and work tasks.    Currently in Pain? No/denies              [facility] PT Assessment -  05/12/20 0001      PROM   Right Shoulder External Rotation 78 Degrees    Left Shoulder Flexion 170 Degrees    Left Shoulder ABduction 160 Degrees      Strength   Overall Strength Comments 5/5 gross                          [facility] Adult PT Treatment/Exercise - 05/12/20 0001      Self-Care   Self-Care Other Self-Care Comments    Other Self-Care Comments  reviewed how to progress exercises and how to increase overhead endurance       Shoulder Exercises: Standing   External Rotation 20 reps    Theraband Level (Shoulder External Rotation) Level 3 (Green)    Extension 20 reps    Theraband Level (Shoulder Extension) Level 3 (Green)    Row 20 reps    Theraband Level (Shoulder Row) Level 3 (Green)    Other Standing Exercises internal rotation green x20     Other Standing Exercises standing flexion 3lb 2x10; standing y's 1lb x20 ; lateral band walk x5 each direction; wall clock 5x5 each direction                   PT Education - 05/12/20 1033    Education Details  final HEP    Person(s) Educated Patient    Methods Explanation;Demonstration;Tactile cues;Verbal cues    Comprehension Verbalized understanding;Returned demonstration;Verbal cues required;Tactile cues required            PT Short Term Goals - 05/12/20 1045      PT SHORT TERM GOAL #1   Title He will be independent with initial HEP    Time 4    Period Weeks    Status Achieved      PT SHORT TERM GOAL #2   Title safely progress  into 4-6 weeks of protocol.    Time 4    Period Weeks    Status Achieved             PT Long Term Goals - 03/23/20 1139      PT LONG TERM GOAL #1   Title He will be indpendent with all hEP    Time 16    Period Weeks    Status On-going      PT LONG TERM GOAL #2   Title He will be able to lift  25# or higher with 1-2 max pain to be able to return to work.    Time 16    Period Weeks    Status On-going      PT LONG TERM GOAL #3   Title He will have full ROM  to be able to RTW safely    Time 16    Period Weeks    Status On-going      PT LONG TERM GOAL #4   Title He wil have pain max 1-2/10 with all normal home tasks    Time 16    Period Weeks    Status On-going      PT LONG TERM GOAL #5   Title Normal self care without compensation no pain    Time 16    Period Weeks    Status On-going      PT LONG TERM GOAL #6   Title FOTO score will improve to 31% limited from 68% limited    Time 16    Period Weeks    Status On-going                 Plan - 05/12/20 1034    Clinical Impression Statement Patient has reached his visit limit for therapy at this time. He has also reached his max benefit from therapy. He has advanced stability and strengthening exercises that he can perfrom at home. 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 Therapy updated his HEP and talked to him about activity progression.    Examination-Activity Limitations Reach Overhead;Hygiene/Grooming;Bathing;Lift    Examination-Participation Restrictions Cleaning;Community Activity;Driving;Yard Work    Designer, jewellery Low    Rehab Potential Good    PT Frequency 2x / week    PT Duration 12 weeks    PT Treatment/Interventions Passive range of motion;Manual techniques;Patient/family education;Therapeutic exercise;Cryotherapy;Vasopneumatic Device;Electrical Stimulation    PT Next Visit Plan D/C to HEP    PT Home Exercise Plan Access Code: [email or web address]    Consulted and Agree with Plan of Care Patient           Patient will benefit from skilled therapeutic intervention in order to improve the following deficits and impairments:  Pain, Impaired UE functional use, Decreased activity tolerance, Decreased range of motion, Increased muscle spasms, Decreased strength  Visit Diagnosis:  Acute pain of left shoulder  Stiffness of left shoulder, not elsewhere classified  Muscle  weakness (generalized)  Cramp and spasm  Status post left rotator cuff repair  Localized edema  PHYSICAL THERAPY DISCHARGE SUMMARY  Visits from Start of Care: 27  Current functional level related to goals / functional outcomes: Has progressed well with strengthneing. Near full range of motion   Remaining deficits: Decreased endurance at end range   Education / Equipment: HEP   Plan: Patient agrees to discharge.  Patient goals were met. Patient is being discharged due to meeting the stated rehab goals.  ?????       Problem List Patient Active Problem List   Diagnosis Date Noted  . Contusion 10/22/2019  . Zoster 12/28/2018  . Emphysema of lung ([place]) 01/02/2017  . Smoker 01/02/2017  . Rash and nonspecific skin eruption 12/07/2015  . HIV disease ([place]) 03/06/2010    Carney Living 05/12/2020, 12:27 PM  [facility] [address] [place], [place], [address] Phone: [phone]   Fax:  [phone]  Name: Jeremy Harper MRN: [id number] Date of Birth: [birth year]

## 2020-05-18 ENCOUNTER — Ambulatory Visit: Payer: 59 | Admitting: Physical Therapy

## 2020-05-22 ENCOUNTER — Encounter: Payer: 59 | Admitting: Physical Therapy

## 2020-05-24 ENCOUNTER — Encounter: Payer: 59 | Admitting: Physical Therapy

## 2020-05-29 ENCOUNTER — Other Ambulatory Visit: Payer: Self-pay

## 2020-05-29 ENCOUNTER — Encounter: Payer: 59 | Admitting: Physical Therapy

## 2020-05-29 ENCOUNTER — Other Ambulatory Visit: Payer: Self-pay | Admitting: Infectious Disease

## 2020-05-29 DIAGNOSIS — B20 Human immunodeficiency virus [HIV] disease: Secondary | ICD-10-CM

## 2020-05-29 MED ORDER — TIVICAY 50 MG PO TABS: ORAL_TABLET | ORAL | 5 refills | Status: DC

## 2020-05-30 MED FILL — TIVICAY 50 MG TABLET: 50 | 30 days supply | Qty: 30 | Fill #0

## 2020-05-30 MED FILL — DESCOVY 200-25 MG TABS: 200-25 | 30 days supply | Qty: 30 | Fill #0

## 2020-05-31 ENCOUNTER — Encounter: Payer: 59 | Admitting: Physical Therapy

## 2020-06-05 ENCOUNTER — Encounter: Payer: 59 | Admitting: Physical Therapy

## 2020-06-07 ENCOUNTER — Encounter: Payer: 59 | Admitting: Physical Therapy

## 2020-06-19 DIAGNOSIS — Z9889 Other specified postprocedural states: Secondary | ICD-10-CM | POA: Diagnosis not present

## 2020-06-19 DIAGNOSIS — M25512 Pain in left shoulder: Secondary | ICD-10-CM | POA: Diagnosis not present

## 2020-07-03 ENCOUNTER — Telehealth: Payer: Self-pay

## 2020-07-03 NOTE — Telephone Encounter (Signed)
[facility] Patient Advocate Encounter  [facility] and I have been unsuccsessful in reaching patient to be able to refill medication.    We have tried multiple times without a response.  Kaylin Marcon, CPhT Specialty Pharmacy Patient Advocate [facility] Phone: [phone] Fax:  [phone]  

## 2020-08-07 ENCOUNTER — Other Ambulatory Visit: Payer: Self-pay

## 2020-08-07 ENCOUNTER — Other Ambulatory Visit: Payer: 59

## 2020-08-07 DIAGNOSIS — B20 Human immunodeficiency virus [HIV] disease: Secondary | ICD-10-CM | POA: Diagnosis not present

## 2020-08-08 LAB — T-HELPER CELL (CD4) - (RCID CLINIC ONLY)
CD4 % Helper T Cell: 19 % — ABNORMAL LOW (ref 33–65)
CD4 T Cell Abs: 585 /uL (ref 400–1790)

## 2020-08-09 LAB — CBC WITH DIFFERENTIAL/PLATELET
Absolute Monocytes: 423 cells/uL (ref 200–950)
Basophils Absolute: 39 cells/uL (ref 0–200)
Basophils Relative: 0.6 %
Eosinophils Absolute: 169 cells/uL (ref 15–500)
Eosinophils Relative: 2.6 %
HCT: 39.7 % (ref 38.5–50.0)
Hemoglobin: 13.5 g/dL (ref 13.2–17.1)
Lymphs Abs: 3361 cells/uL (ref 850–3900)
MCH: 31.3 pg (ref 27.0–33.0)
MCHC: 34 g/dL (ref 32.0–36.0)
MCV: 92.1 fL (ref 80.0–100.0)
MPV: 11.2 fL (ref 7.5–12.5)
Monocytes Relative: 6.5 %
Neutro Abs: 2509 cells/uL (ref 1500–7800)
Neutrophils Relative %: 38.6 %
Platelets: 211 10*3/uL (ref 140–400)
RBC: 4.31 10*6/uL (ref 4.20–5.80)
RDW: 12.8 % (ref 11.0–15.0)
Total Lymphocyte: 51.7 %
WBC: 6.5 10*3/uL (ref 3.8–10.8)

## 2020-08-09 LAB — COMPLETE METABOLIC PANEL WITH GFR
AG Ratio: 1.4 (calc) (ref 1.0–2.5)
ALT: 17 U/L (ref 9–46)
AST: 20 U/L (ref 10–40)
Albumin: 4.2 g/dL (ref 3.6–5.1)
Alkaline phosphatase (APISO): 97 U/L (ref 36–130)
BUN: 11 mg/dL (ref 7–25)
CO2: 26 mmol/L (ref 20–32)
Calcium: 9.2 mg/dL (ref 8.6–10.3)
Chloride: 106 mmol/L (ref 98–110)
Creat: 1.11 mg/dL (ref 0.60–1.35)
GFR, Est African American: 98 mL/min/{1.73_m2} (ref >=60)
GFR, Est Non African American: 84 mL/min/{1.73_m2} (ref >=60)
Globulin: 2.9 g/dL (calc) (ref 1.9–3.7)
Glucose, Bld: 114 mg/dL — ABNORMAL HIGH (ref 65–99)
Potassium: 3.7 mmol/L (ref 3.5–5.3)
Sodium: 139 mmol/L (ref 135–146)
Total Bilirubin: 0.4 mg/dL (ref 0.2–1.2)
Total Protein: 7.1 g/dL (ref 6.1–8.1)

## 2020-08-09 LAB — HIV-1 RNA QUANT-NO REFLEX-BLD
HIV 1 RNA Quant: 58 Copies/mL — ABNORMAL HIGH
HIV-1 RNA Quant, Log: 1.76 Log cps/mL — ABNORMAL HIGH

## 2020-08-09 LAB — RPR: RPR Ser Ql: NONREACTIVE

## 2020-08-28 ENCOUNTER — Encounter: Payer: Self-pay | Admitting: Infectious Disease

## 2020-08-28 ENCOUNTER — Other Ambulatory Visit: Payer: Self-pay

## 2020-08-28 ENCOUNTER — Ambulatory Visit (INDEPENDENT_AMBULATORY_CARE_PROVIDER_SITE_OTHER): Payer: 59 | Admitting: Infectious Disease

## 2020-08-28 ENCOUNTER — Other Ambulatory Visit: Payer: Self-pay | Admitting: Infectious Disease

## 2020-08-28 VITALS — BP 130/77 | HR 88 | Temp 97.7°F | Resp 16 | Ht 69.0 in | Wt 184.0 lb

## 2020-08-28 DIAGNOSIS — F17200 Nicotine dependence, unspecified, uncomplicated: Secondary | ICD-10-CM

## 2020-08-28 DIAGNOSIS — B20 Human immunodeficiency virus [HIV] disease: Secondary | ICD-10-CM | POA: Diagnosis not present

## 2020-08-28 DIAGNOSIS — J430 Unilateral pulmonary emphysema [MacLeod's syndrome]: Secondary | ICD-10-CM

## 2020-08-28 MED ORDER — DESCOVY 200-25 MG PO TABS: 1.0000 | ORAL_TABLET | Freq: Every day | ORAL | 11 refills | Status: DC

## 2020-08-28 MED ORDER — TIVICAY 50 MG PO TABS: ORAL_TABLET | ORAL | 11 refills | Status: DC

## 2020-08-28 MED ORDER — BIKTARVY 50-200-25 MG PO TABS: 1.0000 | ORAL_TABLET | Freq: Every day | ORAL | 11 refills | Status: DC
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
## 2020-08-29 MED FILL — BIKTARVY 50-200-25 MG TABS: 50-200-25 | 30 days supply | Qty: 30 | Fill #0

## 2020-09-09 DIAGNOSIS — Z20828 Contact with and (suspected) exposure to other viral communicable diseases: Secondary | ICD-10-CM | POA: Diagnosis not present

## 2020-09-15 ENCOUNTER — Other Ambulatory Visit: Payer: Self-pay

## 2020-09-15 ENCOUNTER — Other Ambulatory Visit: Payer: 59

## 2020-09-15 DIAGNOSIS — Z20822 Contact with and (suspected) exposure to covid-19: Secondary | ICD-10-CM

## 2020-09-15 NOTE — Progress Notes (Signed)
ab 

## 2020-09-18 LAB — NOVEL CORONAVIRUS, NAA: SARS-CoV-2, NAA: NOT DETECTED

## 2020-09-21 MED FILL — BIKTARVY 50-200-25 MG TABS: 50-200-25 | 30 days supply | Qty: 30 | Fill #1

## 2020-09-29 ENCOUNTER — Ambulatory Visit: Payer: 59 | Admitting: Infectious Disease

## 2020-10-17 MED FILL — BIKTARVY 50-200-25 MG TABS: 50-200-25 | 30 days supply | Qty: 30 | Fill #2

## 2020-11-15 MED FILL — BIKTARVY 50-200-25 MG TABS: 50-200-25 | 30 days supply | Qty: 30 | Fill #3

## 2020-12-05 ENCOUNTER — Other Ambulatory Visit (HOSPITAL_COMMUNITY): Payer: Self-pay

## 2020-12-09 ENCOUNTER — Other Ambulatory Visit (HOSPITAL_COMMUNITY): Payer: Self-pay

## 2020-12-09 MED FILL — Bictegravir-Emtricitabine-Tenofovir AF Tab 50-200-25 MG: ORAL | 30 days supply | Qty: 30 | Fill #0 | Status: AC

## 2020-12-10 ENCOUNTER — Other Ambulatory Visit (HOSPITAL_COMMUNITY): Payer: Self-pay

## 2020-12-11 ENCOUNTER — Other Ambulatory Visit (HOSPITAL_COMMUNITY): Payer: Self-pay

## 2020-12-20 ENCOUNTER — Other Ambulatory Visit (HOSPITAL_COMMUNITY): Payer: Self-pay

## 2020-12-20 DIAGNOSIS — Z0000 Encounter for general adult medical examination without abnormal findings: Secondary | ICD-10-CM | POA: Diagnosis not present

## 2020-12-20 DIAGNOSIS — Z13220 Encounter for screening for lipoid disorders: Secondary | ICD-10-CM | POA: Diagnosis not present

## 2020-12-20 DIAGNOSIS — B20 Human immunodeficiency virus [HIV] disease: Secondary | ICD-10-CM | POA: Diagnosis not present

## 2020-12-20 DIAGNOSIS — M21612 Bunion of left foot: Secondary | ICD-10-CM | POA: Diagnosis not present

## 2020-12-20 DIAGNOSIS — J449 Chronic obstructive pulmonary disease, unspecified: Secondary | ICD-10-CM | POA: Diagnosis not present

## 2020-12-20 DIAGNOSIS — M21611 Bunion of right foot: Secondary | ICD-10-CM | POA: Diagnosis not present

## 2020-12-20 DIAGNOSIS — R739 Hyperglycemia, unspecified: Secondary | ICD-10-CM | POA: Diagnosis not present

## 2020-12-20 DIAGNOSIS — R946 Abnormal results of thyroid function studies: Secondary | ICD-10-CM | POA: Diagnosis not present

## 2020-12-20 MED ORDER — ALBUTEROL SULFATE HFA 108 (90 BASE) MCG/ACT IN AERS
1.0000 | INHALATION_SPRAY | RESPIRATORY_TRACT | 5 refills | Status: DC
  Filled 2020-12-20: qty 18, 33d supply, fill #0
  Filled 2021-01-16: qty 18, 30d supply, fill #0

## 2020-12-28 ENCOUNTER — Other Ambulatory Visit (HOSPITAL_COMMUNITY): Payer: Self-pay

## 2021-01-04 ENCOUNTER — Other Ambulatory Visit (HOSPITAL_COMMUNITY): Payer: Self-pay

## 2021-01-04 MED FILL — Bictegravir-Emtricitabine-Tenofovir AF Tab 50-200-25 MG: ORAL | 30 days supply | Qty: 30 | Fill #1 | Status: CN

## 2021-01-04 MED FILL — Bictegravir-Emtricitabine-Tenofovir AF Tab 50-200-25 MG: ORAL | 30 days supply | Qty: 30 | Fill #1 | Status: AC

## 2021-01-05 ENCOUNTER — Other Ambulatory Visit (HOSPITAL_COMMUNITY): Payer: Self-pay

## 2021-01-16 ENCOUNTER — Other Ambulatory Visit (HOSPITAL_COMMUNITY): Payer: Self-pay

## 2021-02-02 ENCOUNTER — Other Ambulatory Visit (HOSPITAL_COMMUNITY): Payer: Self-pay

## 2021-02-02 MED FILL — Bictegravir-Emtricitabine-Tenofovir AF Tab 50-200-25 MG: ORAL | 30 days supply | Qty: 30 | Fill #2 | Status: AC

## 2021-03-01 ENCOUNTER — Other Ambulatory Visit (HOSPITAL_COMMUNITY): Payer: Self-pay

## 2021-03-01 MED FILL — Bictegravir-Emtricitabine-Tenofovir AF Tab 50-200-25 MG: ORAL | 30 days supply | Qty: 30 | Fill #3 | Status: CN

## 2021-03-05 ENCOUNTER — Other Ambulatory Visit (HOSPITAL_COMMUNITY): Payer: Self-pay

## 2021-03-05 MED FILL — Bictegravir-Emtricitabine-Tenofovir AF Tab 50-200-25 MG: ORAL | 30 days supply | Qty: 30 | Fill #3 | Status: AC

## 2021-03-30 ENCOUNTER — Other Ambulatory Visit (HOSPITAL_COMMUNITY): Payer: Self-pay

## 2021-04-02 ENCOUNTER — Other Ambulatory Visit (HOSPITAL_COMMUNITY): Payer: Self-pay

## 2021-04-02 ENCOUNTER — Other Ambulatory Visit: Payer: Self-pay

## 2021-04-02 ENCOUNTER — Ambulatory Visit: Payer: 59 | Admitting: Podiatry

## 2021-04-02 DIAGNOSIS — L989 Disorder of the skin and subcutaneous tissue, unspecified: Secondary | ICD-10-CM

## 2021-04-02 DIAGNOSIS — M79671 Pain in right foot: Secondary | ICD-10-CM

## 2021-04-02 DIAGNOSIS — M79672 Pain in left foot: Secondary | ICD-10-CM

## 2021-04-02 MED FILL — Bictegravir-Emtricitabine-Tenofovir AF Tab 50-200-25 MG: ORAL | 30 days supply | Qty: 30 | Fill #4 | Status: AC

## 2021-04-02 NOTE — Progress Notes (Signed)
   Subjective: 40 y.o. male presenting to the office today as a new patient for evaluation of symptomatic calluses that developed to the patient's bilateral great toes.  They have been present for several years.  He occasionally shaves them down himself.  He presents for further treatment and evaluation   Past Medical History:  Diagnosis Date   AIDS ([facility]) 12/07/2015   Emphysema of lung ([facility]) 01/02/2017   Rash and nonspecific skin eruption 12/07/2015   Smoker 01/02/2017   Thrush 12/07/2015   Zoster 12/28/2018     Objective:  Physical Exam General: Alert and oriented x3 in no acute distress  Dermatology: Hyperkeratotic lesion(s) present on the bilateral great toes medial aspect. Pain on palpation with a central nucleated core noted. Skin is warm, dry and supple bilateral lower extremities. Negative for open lesions or macerations.  Vascular: Palpable pedal pulses bilaterally. No edema or erythema noted. Capillary refill within normal limits.  Neurological: Epicritic and protective threshold grossly intact bilaterally.   Musculoskeletal Exam: Pain on palpation at the keratotic lesion(s) noted. Range of motion within normal limits bilateral. Muscle strength 5/5 in all groups bilateral.  Assessment: 1.  Preulcerative callus lesions bilateral great toes   Plan of Care:  1. Patient evaluated 2. Excisional debridement of keratoic lesion(s) using a chisel blade was performed without incident.  3. Dressed area with light dressing.  Salicylic acid provided for the patient to apply daily 4. Patient is to return to the clinic PRN.   Felecia Shelling, DPM [facility]  Dr. Felecia Shelling, DPM    [address] [place], [place] [address]                Office [phone]  Fax [phone]

## 2021-04-03 ENCOUNTER — Other Ambulatory Visit (HOSPITAL_COMMUNITY): Payer: Self-pay

## 2021-04-25 ENCOUNTER — Other Ambulatory Visit (HOSPITAL_COMMUNITY): Payer: Self-pay

## 2021-05-04 ENCOUNTER — Other Ambulatory Visit (HOSPITAL_COMMUNITY): Payer: Self-pay

## 2021-05-04 MED FILL — Bictegravir-Emtricitabine-Tenofovir AF Tab 50-200-25 MG: ORAL | 30 days supply | Qty: 30 | Fill #5 | Status: AC

## 2021-05-25 ENCOUNTER — Other Ambulatory Visit (HOSPITAL_COMMUNITY): Payer: Self-pay

## 2021-05-25 MED FILL — Bictegravir-Emtricitabine-Tenofovir AF Tab 50-200-25 MG: ORAL | 30 days supply | Qty: 30 | Fill #6 | Status: AC

## 2021-05-28 ENCOUNTER — Other Ambulatory Visit (HOSPITAL_COMMUNITY): Payer: Self-pay

## 2021-05-31 ENCOUNTER — Other Ambulatory Visit (HOSPITAL_COMMUNITY): Payer: Self-pay

## 2021-06-01 ENCOUNTER — Other Ambulatory Visit (HOSPITAL_COMMUNITY): Payer: Self-pay

## 2021-06-01 MED ORDER — AZITHROMYCIN 250 MG PO TABS
ORAL_TABLET | ORAL | 0 refills | Status: DC
  Filled 2021-06-01: qty 6, 5d supply, fill #0

## 2021-06-01 MED ORDER — PREDNISONE 10 MG PO TABS
ORAL_TABLET | ORAL | 0 refills | Status: DC
  Filled 2021-06-01: qty 19, 10d supply, fill #0

## 2021-06-07 ENCOUNTER — Ambulatory Visit: Payer: 59 | Admitting: Infectious Disease

## 2021-06-20 ENCOUNTER — Ambulatory Visit: Payer: 59 | Admitting: Infectious Disease

## 2021-06-27 ENCOUNTER — Other Ambulatory Visit (HOSPITAL_COMMUNITY): Payer: Self-pay

## 2021-06-27 MED FILL — Bictegravir-Emtricitabine-Tenofovir AF Tab 50-200-25 MG: ORAL | 30 days supply | Qty: 30 | Fill #7 | Status: AC

## 2021-06-28 ENCOUNTER — Other Ambulatory Visit (HOSPITAL_COMMUNITY): Payer: Self-pay

## 2021-07-20 ENCOUNTER — Ambulatory Visit: Payer: 59 | Admitting: Infectious Disease

## 2021-07-25 ENCOUNTER — Other Ambulatory Visit: Payer: Self-pay | Admitting: Infectious Disease

## 2021-07-25 ENCOUNTER — Other Ambulatory Visit (HOSPITAL_COMMUNITY): Payer: Self-pay

## 2021-07-26 ENCOUNTER — Other Ambulatory Visit (HOSPITAL_COMMUNITY): Payer: Self-pay

## 2021-08-17 ENCOUNTER — Ambulatory Visit: Payer: 59 | Admitting: Infectious Disease

## 2021-08-22 ENCOUNTER — Other Ambulatory Visit: Payer: Self-pay | Admitting: Infectious Disease

## 2021-08-22 ENCOUNTER — Other Ambulatory Visit (HOSPITAL_COMMUNITY): Payer: Self-pay

## 2021-08-23 ENCOUNTER — Other Ambulatory Visit (HOSPITAL_COMMUNITY): Payer: Self-pay

## 2021-08-23 ENCOUNTER — Other Ambulatory Visit: Payer: Self-pay | Admitting: Infectious Disease

## 2021-08-24 ENCOUNTER — Other Ambulatory Visit: Payer: Self-pay | Admitting: Infectious Disease

## 2021-08-24 ENCOUNTER — Other Ambulatory Visit (HOSPITAL_COMMUNITY): Payer: Self-pay

## 2021-08-24 NOTE — Telephone Encounter (Signed)
Patient scheduled on 12/20.

## 2021-08-28 ENCOUNTER — Other Ambulatory Visit: Payer: Self-pay

## 2021-08-28 ENCOUNTER — Other Ambulatory Visit (HOSPITAL_COMMUNITY)
Admission: RE | Admit: 2021-08-28 | Discharge: 2021-08-28 | Disposition: A | Discharge status: Home / Self Care | Payer: 59 | Source: Ambulatory Visit | Attending: Infectious Disease | Admitting: Infectious Disease

## 2021-08-28 ENCOUNTER — Other Ambulatory Visit (HOSPITAL_COMMUNITY): Payer: Self-pay

## 2021-08-28 ENCOUNTER — Ambulatory Visit: Payer: 59 | Admitting: Pharmacist

## 2021-08-28 DIAGNOSIS — B20 Human immunodeficiency virus [HIV] disease: Secondary | ICD-10-CM | POA: Diagnosis not present

## 2021-08-28 MED ORDER — BICTEGRAVIR-EMTRICITAB-TENOFOV 50-200-25 MG PO TABS
1.0000 | ORAL_TABLET | Freq: Every day | ORAL | 4 refills | Status: DC
  Filled 2021-08-28 – 2021-08-30 (×2): qty 30, 30d supply, fill #0
  Filled 2021-09-21: qty 30, 30d supply, fill #1
  Filled 2021-10-17: qty 30, 30d supply, fill #2
  Filled 2021-11-13: qty 30, 30d supply, fill #3
  Filled 2021-12-10: qty 30, 30d supply, fill #4

## 2021-08-28 NOTE — Progress Notes (Signed)
08/28/2021  HPI: Jeremy Harper is a 40 y.o. male who presents to the [facility] pharmacy clinic for HIV follow-up.  Patient Active Problem List   Diagnosis Date Noted   Contusion 10/22/2019   Zoster 12/28/2018   Emphysema of lung ([facility]) 01/02/2017   Smoker 01/02/2017   Rash and nonspecific skin eruption 12/07/2015   HIV disease ([facility]) 03/06/2010    Patient's Medications  New Prescriptions   No medications on file  Previous Medications   ACETAMINOPHEN (TYLENOL) 500 MG TABLET    Take 2 tablets (1,000 mg total) by mouth every 6 (six) hours as needed for mild pain.   ALBUTEROL (VENTOLIN HFA) 108 (90 BASE) MCG/ACT INHALER    Inhale 1 puff into the lungs every 4 (four) hours as needed   AZITHROMYCIN (ZITHROMAX) 250 MG TABLET    Take 2 tablets by mouth on the first day, then 1 tablet daily for the next 4 days   BICTEGRAVIR-EMTRICITABINE-TENOFOVIR AF (BIKTARVY) 50-200-25 MG TABS TABLET    TAKE 1 TABLET BY MOUTH DAILY.   IBUPROFEN (ADVIL) 200 MG TABLET    Take 3 tablets (600 mg total) by mouth every 6 (six) hours as needed for mild pain or moderate pain.   PREDNISONE (DELTASONE) 10 MG TABLET    Take 3 tablets daily in the morning for 3 days , 2 tabs for 3 days, then 1 tab for 4 days  Modified Medications   No medications on file  Discontinued Medications   No medications on file    Allergies: No Known Allergies  Past Medical History: Past Medical History:  Diagnosis Date   AIDS (HCC) 12/07/2015   Emphysema of lung ([facility]) 01/02/2017   Rash and nonspecific skin eruption 12/07/2015   Smoker 01/02/2017   Thrush 12/07/2015   Zoster 12/28/2018    Social History: Social History   Socioeconomic History   Marital status: Married    Spouse name: Not on file   Number of children: Not on file   Years of education: Not on file   Highest education level: Not on file  Occupational History   Not on file  Tobacco Use   Smoking status: Every Day    Packs/day: 0.60    Types: Cigarettes    Smokeless tobacco: Never   Tobacco comments:    cutting back  Substance and Sexual Activity   Alcohol use: Yes    Alcohol/week: 2.0 standard drinks    Types: 2 Standard drinks or equivalent per week    Comment: occasional   Drug use: Yes    Frequency: 2.0 times per week    Types: Marijuana   Sexual activity: Yes    Partners: Female    Birth control/protection: Condom    Comment: declined condoms  Other Topics Concern   Not on file  Social History Narrative   Not on file   Social Determinants of Health   Financial Resource Strain: Not on file  Food Insecurity: Not on file  Transportation Needs: Not on file  Physical Activity: Not on file  Stress: Not on file  Social Connections: Not on file    Labs: Lab Results  Component Value Date   HIV1RNAQUANT 58 (H) 08/07/2020   HIV1RNAQUANT 55 (H) 04/27/2020   HIV1RNAQUANT <20 DETECTED (A) 05/24/2019   CD4TABS 585 08/07/2020   CD4TABS 446 04/27/2020   CD4TABS 268 (L) 10/25/2019    RPR and STI Lab Results  Component Value Date   LABRPR NON-REACTIVE 08/07/2020   LABRPR NON-REACTIVE 04/27/2020  LABRPR REACTIVE (A) 05/24/2019   LABRPR NON-REACTIVE 12/28/2018   LABRPR NON-REACTIVE 03/05/2018   RPRTITER 1:2 (H) 05/24/2019    STI Results GC CT  05/24/2019 Negative Negative  11/10/2017 Negative Negative  11/29/2015 Negative Negative    Hepatitis B Lab Results  Component Value Date   HEPBSAB POS (A) 06/04/2016   HEPBSAG NEGATIVE 06/04/2016   HEPBCAB NEG 03/13/2010   Hepatitis C No results found for: HEPCAB, HCVRNAPCRQN Hepatitis A Lab Results  Component Value Date   HAV NEG 03/13/2010   Lipids: Lab Results  Component Value Date   CHOL 138 12/28/2018   TRIG 62 12/28/2018   HDL 44 12/28/2018   CHOLHDL 3.1 12/28/2018   VLDL 17 04/25/2011   LDLCALC 80 12/28/2018    Current HIV Regimen: Biktarvy  Assessment: Jeremy Harper presents to clinic today for his HIV follow-up appointment. He takes Radio producer daily without any  issues or missed doses. He reports that he has only missed a couple of doses since his last visit here last year. He receives his medications via mailing from [facility] without any issues. He just received his last refill last week. Overall he is feeling well. He apologized for multiple missed appointments and said that he had just gotten busy and forgot to reschedule appointments. I will send in a refills for Biktarvy to [facility]. All questions and concerns answered at his visit today.  Plan: Check HIV RNA, CD4, CBC, CMET, RPR, Urine Cytology Follow-up with Jeremy Harper on 12/17/21 @ 4:15  Jeremy Harper, PharmD PGY2 Infectious Diseases Pharmacy Resident

## 2021-08-28 NOTE — Progress Notes (Signed)
Patient should also have lipid profile assessed at his next visit.  Margarite Gouge, PharmD, CPP Clinical Pharmacist Practitioner Infectious Diseases Clinical Pharmacist [facility]

## 2021-08-28 NOTE — Progress Notes (Signed)
Biktarvy refills sent to [facility] for mailing.   Shirlee More, PharmD PGY2 Infectious Diseases Pharmacy Resident

## 2021-08-29 ENCOUNTER — Other Ambulatory Visit (HOSPITAL_COMMUNITY): Payer: Self-pay

## 2021-08-29 LAB — T-HELPER CELLS (CD4) COUNT (NOT AT ARMC)
CD4 % Helper T Cell: 19 % — ABNORMAL LOW (ref 33–65)
CD4 T Cell Abs: 689 /uL (ref 400–1790)

## 2021-08-30 ENCOUNTER — Other Ambulatory Visit (HOSPITAL_COMMUNITY): Payer: Self-pay

## 2021-08-30 LAB — URINE CYTOLOGY ANCILLARY ONLY
Chlamydia: NEGATIVE
Comment: NEGATIVE
Comment: NORMAL
Neisseria Gonorrhea: NEGATIVE

## 2021-08-30 LAB — CBC
HCT: 38.2 % — ABNORMAL LOW (ref 38.5–50.0)
Hemoglobin: 13.2 g/dL (ref 13.2–17.1)
MCH: 32 pg (ref 27.0–33.0)
MCHC: 34.6 g/dL (ref 32.0–36.0)
MCV: 92.7 fL (ref 80.0–100.0)
MPV: 10.6 fL (ref 7.5–12.5)
Platelets: 198 10*3/uL (ref 140–400)
RBC: 4.12 10*6/uL — ABNORMAL LOW (ref 4.20–5.80)
RDW: 12.6 % (ref 11.0–15.0)
WBC: 6 10*3/uL (ref 3.8–10.8)

## 2021-08-30 LAB — COMPREHENSIVE METABOLIC PANEL
AG Ratio: 1.7 (calc) (ref 1.0–2.5)
ALT: 16 U/L (ref 9–46)
AST: 18 U/L (ref 10–40)
Albumin: 4.3 g/dL (ref 3.6–5.1)
Alkaline phosphatase (APISO): 93 U/L (ref 36–130)
BUN: 11 mg/dL (ref 7–25)
CO2: 26 mmol/L (ref 20–32)
Calcium: 8.9 mg/dL (ref 8.6–10.3)
Chloride: 107 mmol/L (ref 98–110)
Creat: 1.16 mg/dL (ref 0.60–1.26)
Globulin: 2.6 g/dL (calc) (ref 1.9–3.7)
Glucose, Bld: 77 mg/dL (ref 65–99)
Potassium: 4.1 mmol/L (ref 3.5–5.3)
Sodium: 139 mmol/L (ref 135–146)
Total Bilirubin: 0.5 mg/dL (ref 0.2–1.2)
Total Protein: 6.9 g/dL (ref 6.1–8.1)

## 2021-08-30 LAB — RPR: RPR Ser Ql: NONREACTIVE

## 2021-08-30 LAB — HIV-1 RNA QUANT-NO REFLEX-BLD
HIV 1 RNA Quant: 24 Copies/mL — ABNORMAL HIGH
HIV-1 RNA Quant, Log: 1.38 Log cps/mL — ABNORMAL HIGH

## 2021-09-21 ENCOUNTER — Other Ambulatory Visit (HOSPITAL_COMMUNITY): Payer: Self-pay
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
## 2021-10-15 ENCOUNTER — Other Ambulatory Visit (HOSPITAL_COMMUNITY): Payer: Self-pay

## 2021-10-17 ENCOUNTER — Other Ambulatory Visit (HOSPITAL_COMMUNITY): Payer: Self-pay

## 2021-11-13 ENCOUNTER — Other Ambulatory Visit (HOSPITAL_COMMUNITY): Payer: Self-pay

## 2021-12-07 ENCOUNTER — Other Ambulatory Visit (HOSPITAL_COMMUNITY): Payer: Self-pay

## 2021-12-10 ENCOUNTER — Other Ambulatory Visit (HOSPITAL_COMMUNITY): Payer: Self-pay

## 2021-12-12 ENCOUNTER — Other Ambulatory Visit (HOSPITAL_COMMUNITY): Payer: Self-pay

## 2021-12-17 ENCOUNTER — Ambulatory Visit: Payer: 59 | Admitting: Infectious Disease

## 2021-12-24 ENCOUNTER — Ambulatory Visit (INDEPENDENT_AMBULATORY_CARE_PROVIDER_SITE_OTHER): Payer: 59 | Admitting: Infectious Disease

## 2021-12-24 ENCOUNTER — Other Ambulatory Visit (HOSPITAL_COMMUNITY): Payer: Self-pay

## 2021-12-24 ENCOUNTER — Other Ambulatory Visit: Payer: Self-pay

## 2021-12-24 ENCOUNTER — Encounter: Payer: Self-pay | Admitting: Infectious Disease

## 2021-12-24 VITALS — BP 134/78 | HR 70 | Temp 98.4°F | Ht 69.0 in | Wt 189.0 lb

## 2021-12-24 DIAGNOSIS — B20 Human immunodeficiency virus [HIV] disease: Secondary | ICD-10-CM | POA: Diagnosis not present

## 2021-12-24 DIAGNOSIS — F17200 Nicotine dependence, unspecified, uncomplicated: Secondary | ICD-10-CM

## 2021-12-24 MED ORDER — BICTEGRAVIR-EMTRICITAB-TENOFOV 50-200-25 MG PO TABS
1.0000 | ORAL_TABLET | Freq: Every day | ORAL | 11 refills | Status: DC
  Filled 2021-12-24 – 2022-01-08 (×2): qty 30, 30d supply, fill #0
  Filled 2022-02-18: qty 30, 30d supply, fill #1
  Filled 2022-03-28: qty 30, 30d supply, fill #2
  Filled 2022-04-22: qty 30, 30d supply, fill #3
  Filled 2022-05-17: qty 30, 30d supply, fill #4
  Filled 2022-06-11: qty 30, 30d supply, fill #5
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
## 2021-12-26 LAB — T-HELPER CELLS (CD4) COUNT (NOT AT ARMC)

## 2021-12-26 LAB — HELPER T-LYMPH-CD4 (ARMC ONLY)
% CD 4 Pos. Lymph.: 20.9 % — ABNORMAL LOW (ref 30.8–58.5)
Absolute CD 4 Helper: 648 /uL (ref 359–1519)
Basophils Absolute: 0.1 10*3/uL (ref 0.0–0.2)
Basos: 1 %
EOS (ABSOLUTE): 0.1 10*3/uL (ref 0.0–0.4)
Eos: 3 %
Hematocrit: 40.2 % (ref 37.5–51.0)
Hemoglobin: 13.3 g/dL (ref 13.0–17.7)
Immature Grans (Abs): 0 10*3/uL (ref 0.0–0.1)
Immature Granulocytes: 0 %
Lymphocytes Absolute: 3.1 10*3/uL (ref 0.7–3.1)
Lymphs: 60 %
MCH: 30.9 pg (ref 26.6–33.0)
MCHC: 33.1 g/dL (ref 31.5–35.7)
MCV: 94 fL (ref 79–97)
Monocytes Absolute: 0.3 10*3/uL (ref 0.1–0.9)
Monocytes: 6 %
Neutrophils Absolute: 1.5 10*3/uL (ref 1.4–7.0)
Neutrophils: 30 %
Platelets: 196 10*3/uL (ref 150–450)
RBC: 4.3 x10E6/uL (ref 4.14–5.80)
RDW: 13 % (ref 11.6–15.4)
WBC: 5.1 10*3/uL (ref 3.4–10.8)

## 2021-12-27 LAB — COMPLETE METABOLIC PANEL WITH GFR
AG Ratio: 1.4 (calc) (ref 1.0–2.5)
ALT: 16 U/L (ref 9–46)
AST: 17 U/L (ref 10–40)
Albumin: 4.1 g/dL (ref 3.6–5.1)
Alkaline phosphatase (APISO): 94 U/L (ref 36–130)
BUN/Creatinine Ratio: 9 (calc) (ref 6–22)
BUN: 12 mg/dL (ref 7–25)
CO2: 26 mmol/L (ref 20–32)
Calcium: 8.8 mg/dL (ref 8.6–10.3)
Chloride: 107 mmol/L (ref 98–110)
Creat: 1.32 mg/dL — ABNORMAL HIGH (ref 0.60–1.26)
Globulin: 2.9 g/dL (calc) (ref 1.9–3.7)
Glucose, Bld: 92 mg/dL (ref 65–99)
Potassium: 4.2 mmol/L (ref 3.5–5.3)
Sodium: 139 mmol/L (ref 135–146)
Total Bilirubin: 0.3 mg/dL (ref 0.2–1.2)
Total Protein: 7 g/dL (ref 6.1–8.1)
eGFR: 71 mL/min/{1.73_m2} (ref >=60)

## 2021-12-27 LAB — CBC WITH DIFFERENTIAL/PLATELET
Absolute Monocytes: 375 cells/uL (ref 200–950)
Basophils Absolute: 40 cells/uL (ref 0–200)
Basophils Relative: 0.8 %
Eosinophils Absolute: 170 cells/uL (ref 15–500)
Eosinophils Relative: 3.4 %
HCT: 40.1 % (ref 38.5–50.0)
Hemoglobin: 13.4 g/dL (ref 13.2–17.1)
Lymphs Abs: 3015 cells/uL (ref 850–3900)
MCH: 31.3 pg (ref 27.0–33.0)
MCHC: 33.4 g/dL (ref 32.0–36.0)
MCV: 93.7 fL (ref 80.0–100.0)
MPV: 11.1 fL (ref 7.5–12.5)
Monocytes Relative: 7.5 %
Neutro Abs: 1400 cells/uL — ABNORMAL LOW (ref 1500–7800)
Neutrophils Relative %: 28 %
Platelets: 196 10*3/uL (ref 140–400)
RBC: 4.28 10*6/uL (ref 4.20–5.80)
RDW: 12.9 % (ref 11.0–15.0)
Total Lymphocyte: 60.3 %
WBC: 5 10*3/uL (ref 3.8–10.8)

## 2021-12-27 LAB — LIPID PANEL
Cholesterol: 134 mg/dL (ref <200)
HDL: 39 mg/dL — ABNORMAL LOW (ref >=40)
LDL Cholesterol (Calc): 81 mg/dL (calc)
Non-HDL Cholesterol (Calc): 95 mg/dL (calc) (ref <130)
Total CHOL/HDL Ratio: 3.4 (calc) (ref <5.0)
Triglycerides: 65 mg/dL (ref <150)

## 2021-12-27 LAB — LDL CHOLESTEROL, DIRECT: Direct LDL: 92 mg/dL (ref <100)

## 2021-12-27 LAB — HIV-1 RNA QUANT-NO REFLEX-BLD
HIV 1 RNA Quant: NOT DETECTED copies/mL
HIV-1 RNA Quant, Log: NOT DETECTED Log copies/mL

## 2021-12-27 LAB — RPR: RPR Ser Ql: NONREACTIVE

## 2022-01-03 ENCOUNTER — Other Ambulatory Visit (HOSPITAL_COMMUNITY): Payer: Self-pay

## 2022-01-08 ENCOUNTER — Other Ambulatory Visit (HOSPITAL_COMMUNITY): Payer: Self-pay

## 2022-02-01 ENCOUNTER — Other Ambulatory Visit (HOSPITAL_COMMUNITY): Payer: Self-pay

## 2022-02-05 ENCOUNTER — Other Ambulatory Visit (HOSPITAL_COMMUNITY): Payer: Self-pay

## 2022-02-18 ENCOUNTER — Other Ambulatory Visit (HOSPITAL_COMMUNITY): Payer: Self-pay

## 2022-02-26 ENCOUNTER — Other Ambulatory Visit (HOSPITAL_COMMUNITY): Payer: Self-pay

## 2022-03-07 ENCOUNTER — Other Ambulatory Visit (HOSPITAL_COMMUNITY): Payer: Self-pay

## 2022-03-11 ENCOUNTER — Other Ambulatory Visit (HOSPITAL_COMMUNITY): Payer: Self-pay

## 2022-03-15 ENCOUNTER — Other Ambulatory Visit (HOSPITAL_COMMUNITY): Payer: Self-pay

## 2022-03-28 ENCOUNTER — Other Ambulatory Visit (HOSPITAL_COMMUNITY): Payer: Self-pay

## 2022-04-18 ENCOUNTER — Other Ambulatory Visit (HOSPITAL_COMMUNITY): Payer: Self-pay

## 2022-04-22 ENCOUNTER — Other Ambulatory Visit (HOSPITAL_COMMUNITY): Payer: Self-pay

## 2022-04-29 ENCOUNTER — Other Ambulatory Visit (HOSPITAL_COMMUNITY): Payer: Self-pay

## 2022-05-17 ENCOUNTER — Other Ambulatory Visit (HOSPITAL_COMMUNITY): Payer: Self-pay

## 2022-05-22 ENCOUNTER — Other Ambulatory Visit (HOSPITAL_COMMUNITY): Payer: Self-pay

## 2022-05-27 ENCOUNTER — Other Ambulatory Visit (HOSPITAL_COMMUNITY): Payer: Self-pay

## 2022-05-27 MED ORDER — ALBUTEROL SULFATE HFA 108 (90 BASE) MCG/ACT IN AERS
1.0000 | INHALATION_SPRAY | RESPIRATORY_TRACT | 5 refills | Status: DC
  Filled 2022-05-27 (×2): qty 6.7, 17d supply, fill #0

## 2022-06-11 ENCOUNTER — Other Ambulatory Visit (HOSPITAL_COMMUNITY): Payer: Self-pay

## 2022-06-13 ENCOUNTER — Other Ambulatory Visit (HOSPITAL_COMMUNITY): Payer: Self-pay

## 2022-06-14 ENCOUNTER — Other Ambulatory Visit (HOSPITAL_COMMUNITY): Payer: Self-pay

## 2022-06-21 ENCOUNTER — Other Ambulatory Visit (HOSPITAL_COMMUNITY): Payer: Self-pay

## 2022-06-24 ENCOUNTER — Encounter: Payer: Self-pay | Admitting: Infectious Disease

## 2022-06-24 ENCOUNTER — Ambulatory Visit (INDEPENDENT_AMBULATORY_CARE_PROVIDER_SITE_OTHER): Payer: 59 | Admitting: Infectious Disease

## 2022-06-24 ENCOUNTER — Other Ambulatory Visit: Payer: Self-pay

## 2022-06-24 ENCOUNTER — Other Ambulatory Visit (HOSPITAL_COMMUNITY)
Admission: RE | Admit: 2022-06-24 | Discharge: 2022-06-24 | Disposition: A | Discharge status: Home / Self Care | Payer: 59 | Source: Ambulatory Visit | Attending: Infectious Disease | Admitting: Infectious Disease

## 2022-06-24 ENCOUNTER — Other Ambulatory Visit (HOSPITAL_COMMUNITY): Payer: Self-pay

## 2022-06-24 VITALS — BP 120/79 | HR 76 | Temp 97.9°F | Ht 69.0 in | Wt 197.0 lb

## 2022-06-24 DIAGNOSIS — B20 Human immunodeficiency virus [HIV] disease: Secondary | ICD-10-CM | POA: Diagnosis not present

## 2022-06-24 DIAGNOSIS — E782 Mixed hyperlipidemia: Secondary | ICD-10-CM

## 2022-06-24 DIAGNOSIS — J438 Other emphysema: Secondary | ICD-10-CM | POA: Diagnosis not present

## 2022-06-24 DIAGNOSIS — Z7185 Encounter for immunization safety counseling: Secondary | ICD-10-CM

## 2022-06-24 DIAGNOSIS — F17200 Nicotine dependence, unspecified, uncomplicated: Secondary | ICD-10-CM

## 2022-06-24 MED ORDER — BICTEGRAVIR-EMTRICITAB-TENOFOV 50-200-25 MG PO TABS
1.0000 | ORAL_TABLET | Freq: Every day | ORAL | 11 refills | Status: DC
  Filled 2022-06-24 – 2022-07-08 (×2): qty 30, 30d supply, fill #0
  Filled 2022-08-02: qty 30, 30d supply, fill #1
  Filled 2022-09-03: qty 30, 30d supply, fill #2
  Filled 2022-10-09: qty 30, 30d supply, fill #3
  Filled 2022-10-30: qty 30, 30d supply, fill #4
  Filled 2022-11-29: qty 30, 30d supply, fill #5
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
## 2022-06-25 LAB — T-HELPER CELLS (CD4) COUNT (NOT AT ARMC)
CD4 % Helper T Cell: 20 % — ABNORMAL LOW (ref 33–65)
CD4 T Cell Abs: 616 /uL (ref 400–1790)

## 2022-06-26 LAB — COMPLETE METABOLIC PANEL WITH GFR
AG Ratio: 1.5 (calc) (ref 1.0–2.5)
ALT: 21 U/L (ref 9–46)
AST: 19 U/L (ref 10–40)
Albumin: 4.3 g/dL (ref 3.6–5.1)
Alkaline phosphatase (APISO): 106 U/L (ref 36–130)
BUN/Creatinine Ratio: 13 (calc) (ref 6–22)
BUN: 17 mg/dL (ref 7–25)
CO2: 27 mmol/L (ref 20–32)
Calcium: 9.4 mg/dL (ref 8.6–10.3)
Chloride: 106 mmol/L (ref 98–110)
Creat: 1.27 mg/dL — ABNORMAL HIGH (ref 0.60–1.26)
Globulin: 2.9 g/dL (calc) (ref 1.9–3.7)
Glucose, Bld: 78 mg/dL (ref 65–99)
Potassium: 4.2 mmol/L (ref 3.5–5.3)
Sodium: 140 mmol/L (ref 135–146)
Total Bilirubin: 0.3 mg/dL (ref 0.2–1.2)
Total Protein: 7.2 g/dL (ref 6.1–8.1)
eGFR: 74 mL/min/{1.73_m2} (ref >=60)

## 2022-06-26 LAB — LIPID PANEL
Cholesterol: 141 mg/dL (ref <200)
HDL: 38 mg/dL — ABNORMAL LOW (ref >=40)
LDL Cholesterol (Calc): 87 mg/dL (calc)
Non-HDL Cholesterol (Calc): 103 mg/dL (calc) (ref <130)
Total CHOL/HDL Ratio: 3.7 (calc) (ref <5.0)
Triglycerides: 74 mg/dL (ref <150)

## 2022-06-26 LAB — CBC WITH DIFFERENTIAL/PLATELET
Absolute Monocytes: 497 cells/uL (ref 200–950)
Basophils Absolute: 42 cells/uL (ref 0–200)
Basophils Relative: 0.6 %
Eosinophils Absolute: 161 cells/uL (ref 15–500)
Eosinophils Relative: 2.3 %
HCT: 40.4 % (ref 38.5–50.0)
Hemoglobin: 13.9 g/dL (ref 13.2–17.1)
Lymphs Abs: 3465 cells/uL (ref 850–3900)
MCH: 31.5 pg (ref 27.0–33.0)
MCHC: 34.4 g/dL (ref 32.0–36.0)
MCV: 91.6 fL (ref 80.0–100.0)
MPV: 10.8 fL (ref 7.5–12.5)
Monocytes Relative: 7.1 %
Neutro Abs: 2835 cells/uL (ref 1500–7800)
Neutrophils Relative %: 40.5 %
Platelets: 210 10*3/uL (ref 140–400)
RBC: 4.41 10*6/uL (ref 4.20–5.80)
RDW: 13 % (ref 11.0–15.0)
Total Lymphocyte: 49.5 %
WBC: 7 10*3/uL (ref 3.8–10.8)

## 2022-06-26 LAB — URINE CYTOLOGY ANCILLARY ONLY
Chlamydia: NEGATIVE
Comment: NEGATIVE
Comment: NORMAL
Neisseria Gonorrhea: NEGATIVE

## 2022-06-26 LAB — HIV-1 RNA QUANT-NO REFLEX-BLD
HIV 1 RNA Quant: <20 Copies/mL — ABNORMAL HIGH
HIV-1 RNA Quant, Log: <1.3 Log cps/mL — ABNORMAL HIGH

## 2022-06-26 LAB — RPR: RPR Ser Ql: NONREACTIVE

## 2022-07-05 ENCOUNTER — Other Ambulatory Visit (HOSPITAL_COMMUNITY): Payer: Self-pay

## 2022-07-08 ENCOUNTER — Other Ambulatory Visit (HOSPITAL_COMMUNITY): Payer: Self-pay

## 2022-07-10 ENCOUNTER — Other Ambulatory Visit (HOSPITAL_COMMUNITY): Payer: Self-pay

## 2022-08-02 ENCOUNTER — Other Ambulatory Visit (HOSPITAL_COMMUNITY): Payer: Self-pay

## 2022-08-06 ENCOUNTER — Other Ambulatory Visit (HOSPITAL_COMMUNITY): Payer: Self-pay

## 2022-09-03 ENCOUNTER — Other Ambulatory Visit (HOSPITAL_COMMUNITY): Payer: Self-pay

## 2022-09-05 ENCOUNTER — Other Ambulatory Visit: Payer: Self-pay

## 2022-10-04 ENCOUNTER — Other Ambulatory Visit (HOSPITAL_COMMUNITY): Payer: Self-pay

## 2022-10-07 ENCOUNTER — Other Ambulatory Visit (HOSPITAL_COMMUNITY): Payer: Self-pay

## 2022-10-09 ENCOUNTER — Other Ambulatory Visit (HOSPITAL_COMMUNITY): Payer: Self-pay

## 2022-10-30 ENCOUNTER — Other Ambulatory Visit (HOSPITAL_COMMUNITY): Payer: Self-pay

## 2022-11-04 ENCOUNTER — Other Ambulatory Visit: Payer: Self-pay

## 2022-11-29 ENCOUNTER — Other Ambulatory Visit (HOSPITAL_COMMUNITY): Payer: Self-pay

## 2022-12-02 ENCOUNTER — Other Ambulatory Visit: Payer: Self-pay

## 2022-12-24 ENCOUNTER — Other Ambulatory Visit (HOSPITAL_COMMUNITY)
Admission: RE | Admit: 2022-12-24 | Discharge: 2022-12-24 | Disposition: A | Discharge status: Home / Self Care | Payer: 59 | Source: Ambulatory Visit | Attending: Infectious Disease | Admitting: Infectious Disease

## 2022-12-24 ENCOUNTER — Other Ambulatory Visit: Payer: Self-pay

## 2022-12-24 ENCOUNTER — Ambulatory Visit: Payer: 59 | Admitting: Infectious Disease

## 2022-12-24 ENCOUNTER — Encounter: Payer: Self-pay | Admitting: Infectious Disease

## 2022-12-24 ENCOUNTER — Other Ambulatory Visit (HOSPITAL_COMMUNITY): Payer: Self-pay

## 2022-12-24 VITALS — Ht 69.0 in | Wt 199.0 lb

## 2022-12-24 DIAGNOSIS — G4709 Other insomnia: Secondary | ICD-10-CM

## 2022-12-24 DIAGNOSIS — Z23 Encounter for immunization: Secondary | ICD-10-CM

## 2022-12-24 DIAGNOSIS — F17210 Nicotine dependence, cigarettes, uncomplicated: Secondary | ICD-10-CM

## 2022-12-24 DIAGNOSIS — R5383 Other fatigue: Secondary | ICD-10-CM | POA: Diagnosis not present

## 2022-12-24 DIAGNOSIS — B20 Human immunodeficiency virus [HIV] disease: Secondary | ICD-10-CM

## 2022-12-24 DIAGNOSIS — F17200 Nicotine dependence, unspecified, uncomplicated: Secondary | ICD-10-CM

## 2022-12-24 DIAGNOSIS — G4700 Insomnia, unspecified: Secondary | ICD-10-CM | POA: Insufficient documentation

## 2022-12-24 DIAGNOSIS — Z7185 Encounter for immunization safety counseling: Secondary | ICD-10-CM | POA: Insufficient documentation

## 2022-12-24 HISTORY — DX: Other fatigue: R53.83

## 2022-12-24 HISTORY — DX: Insomnia, unspecified: G47.00

## 2022-12-24 HISTORY — DX: Encounter for immunization safety counseling: Z71.85

## 2022-12-24 MED ORDER — BICTEGRAVIR-EMTRICITAB-TENOFOV 50-200-25 MG PO TABS
1.0000 | ORAL_TABLET | Freq: Every day | ORAL | 11 refills | Status: DC
Start: 2022-12-24 — End: 2023-07-02
  Filled 2022-12-24 – 2022-12-30 (×2): qty 30, 30d supply, fill #0
  Filled 2023-01-21: qty 30, 30d supply, fill #1
  Filled 2023-02-21: qty 30, 30d supply, fill #2
  Filled 2023-03-21: qty 30, 30d supply, fill #3
  Filled 2023-04-18: qty 30, 30d supply, fill #4
  Filled 2023-05-13: qty 30, 30d supply, fill #5
  Filled 2023-06-10: qty 30, 30d supply, fill #6
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
## 2022-12-25 LAB — LIPID PANEL: Total CHOL/HDL Ratio: 3.6 (calc) (ref <5.0)

## 2022-12-26 LAB — URINE CYTOLOGY ANCILLARY ONLY
Chlamydia: NEGATIVE
Comment: NEGATIVE
Comment: NORMAL
Neisseria Gonorrhea: NEGATIVE

## 2022-12-26 LAB — T-HELPER CELLS (CD4) COUNT (NOT AT ARMC)
CD4 % Helper T Cell: 21 % — ABNORMAL LOW (ref 33–65)
CD4 T Cell Abs: 603 /uL (ref 400–1790)

## 2022-12-27 LAB — LIPID PANEL
Cholesterol: 143 mg/dL (ref <200)
HDL: 40 mg/dL (ref >=40)
LDL Cholesterol (Calc): 89 mg/dL (calc)
Non-HDL Cholesterol (Calc): 103 mg/dL (calc) (ref <130)
Triglycerides: 58 mg/dL (ref <150)

## 2022-12-27 LAB — CBC WITH DIFFERENTIAL/PLATELET
Absolute Monocytes: 470 cells/uL (ref 200–950)
Basophils Absolute: 29 cells/uL (ref 0–200)
Basophils Relative: 0.5 %
Eosinophils Absolute: 139 cells/uL (ref 15–500)
Eosinophils Relative: 2.4 %
HCT: 38.5 % (ref 38.5–50.0)
Hemoglobin: 12.9 g/dL — ABNORMAL LOW (ref 13.2–17.1)
Lymphs Abs: 3161 cells/uL (ref 850–3900)
MCH: 30.6 pg (ref 27.0–33.0)
MCHC: 33.5 g/dL (ref 32.0–36.0)
MCV: 91.2 fL (ref 80.0–100.0)
MPV: 10.8 fL (ref 7.5–12.5)
Monocytes Relative: 8.1 %
Neutro Abs: 2001 cells/uL (ref 1500–7800)
Neutrophils Relative %: 34.5 %
Platelets: 214 10*3/uL (ref 140–400)
RBC: 4.22 10*6/uL (ref 4.20–5.80)
RDW: 12.9 % (ref 11.0–15.0)
Total Lymphocyte: 54.5 %
WBC: 5.8 10*3/uL (ref 3.8–10.8)

## 2022-12-27 LAB — COMPLETE METABOLIC PANEL WITH GFR
AG Ratio: 1.7 (calc) (ref 1.0–2.5)
ALT: 44 U/L (ref 9–46)
AST: 51 U/L — ABNORMAL HIGH (ref 10–40)
Albumin: 4.3 g/dL (ref 3.6–5.1)
Alkaline phosphatase (APISO): 87 U/L (ref 36–130)
BUN: 16 mg/dL (ref 7–25)
CO2: 25 mmol/L (ref 20–32)
Calcium: 9.1 mg/dL (ref 8.6–10.3)
Chloride: 108 mmol/L (ref 98–110)
Creat: 1.26 mg/dL (ref 0.60–1.26)
Globulin: 2.6 g/dL (calc) (ref 1.9–3.7)
Glucose, Bld: 111 mg/dL — ABNORMAL HIGH (ref 65–99)
Potassium: 3.9 mmol/L (ref 3.5–5.3)
Sodium: 141 mmol/L (ref 135–146)
Total Bilirubin: 0.3 mg/dL (ref 0.2–1.2)
Total Protein: 6.9 g/dL (ref 6.1–8.1)
eGFR: 74 mL/min/{1.73_m2} (ref >=60)

## 2022-12-27 LAB — RPR: RPR Ser Ql: NONREACTIVE

## 2022-12-27 LAB — HIV-1 RNA QUANT-NO REFLEX-BLD
HIV 1 RNA Quant: 31 Copies/mL — ABNORMAL HIGH
HIV-1 RNA Quant, Log: 1.49 Log cps/mL — ABNORMAL HIGH

## 2022-12-30 ENCOUNTER — Other Ambulatory Visit: Payer: Self-pay

## 2022-12-30 ENCOUNTER — Other Ambulatory Visit (HOSPITAL_COMMUNITY): Payer: Self-pay

## 2023-01-14 ENCOUNTER — Other Ambulatory Visit (HOSPITAL_COMMUNITY): Payer: Self-pay

## 2023-01-14 MED ORDER — ALBUTEROL SULFATE HFA 108 (90 BASE) MCG/ACT IN AERS
1.0000 | INHALATION_SPRAY | RESPIRATORY_TRACT | 5 refills | Status: DC
  Filled 2023-01-14: qty 6.7, 25d supply, fill #0

## 2023-01-14 MED ORDER — TRIAMCINOLONE ACETONIDE 0.1 % EX CREA
TOPICAL_CREAM | Freq: Two times a day (BID) | CUTANEOUS | 3 refills | Status: AC | PRN
  Filled 2023-01-14: qty 15, 30d supply, fill #0
  Filled 2024-01-12 – 2024-01-13 (×2): qty 15, 30d supply, fill #1

## 2023-01-15 ENCOUNTER — Other Ambulatory Visit (HOSPITAL_COMMUNITY): Payer: Self-pay

## 2023-01-21 ENCOUNTER — Other Ambulatory Visit (HOSPITAL_COMMUNITY): Payer: Self-pay

## 2023-01-23 ENCOUNTER — Other Ambulatory Visit (HOSPITAL_COMMUNITY): Payer: Self-pay

## 2023-02-19 ENCOUNTER — Other Ambulatory Visit (HOSPITAL_COMMUNITY): Payer: Self-pay

## 2023-02-21 ENCOUNTER — Other Ambulatory Visit (HOSPITAL_COMMUNITY): Payer: Self-pay

## 2023-02-24 ENCOUNTER — Other Ambulatory Visit (HOSPITAL_COMMUNITY): Payer: Self-pay

## 2023-03-21 ENCOUNTER — Other Ambulatory Visit (HOSPITAL_COMMUNITY): Payer: Self-pay

## 2023-03-24 ENCOUNTER — Other Ambulatory Visit (HOSPITAL_COMMUNITY): Payer: Self-pay

## 2023-04-18 ENCOUNTER — Other Ambulatory Visit: Payer: Self-pay

## 2023-04-21 ENCOUNTER — Other Ambulatory Visit (HOSPITAL_COMMUNITY): Payer: Self-pay

## 2023-05-13 ENCOUNTER — Other Ambulatory Visit (HOSPITAL_COMMUNITY): Payer: Self-pay

## 2023-05-19 ENCOUNTER — Other Ambulatory Visit (HOSPITAL_COMMUNITY): Payer: Self-pay

## 2023-06-10 ENCOUNTER — Other Ambulatory Visit: Payer: Self-pay

## 2023-06-10 NOTE — Progress Notes (Signed)
Specialty Pharmacy Refill Coordination Note  [name] is a 42 y.o. male contacted today regarding refills of specialty medication(s) Bictegravir-Emtricitab-Tenofov   Patient requested Delivery   Delivery date: 06/19/23   Verified address: [address]  [name] [address]   Medication will be filled on 06/18/23.

## 2023-06-18 ENCOUNTER — Other Ambulatory Visit: Payer: 59

## 2023-06-18 ENCOUNTER — Other Ambulatory Visit: Payer: Self-pay

## 2023-06-18 DIAGNOSIS — Z113 Encounter for screening for infections with a predominantly sexual mode of transmission: Secondary | ICD-10-CM

## 2023-06-18 DIAGNOSIS — B20 Human immunodeficiency virus [HIV] disease: Secondary | ICD-10-CM

## 2023-06-19 LAB — T-HELPER CELL (CD4) - (RCID CLINIC ONLY)
CD4 % Helper T Cell: 23 % — ABNORMAL LOW (ref 33–65)
CD4 T Cell Abs: 616 /uL (ref 400–1790)

## 2023-06-20 LAB — CBC WITH DIFFERENTIAL/PLATELET
Absolute Monocytes: 498 {cells}/uL (ref 200–950)
Basophils Absolute: 48 {cells}/uL (ref 0–200)
Basophils Relative: 0.8 %
Eosinophils Absolute: 150 {cells}/uL (ref 15–500)
Eosinophils Relative: 2.5 %
HCT: 40.2 % (ref 38.5–50.0)
Hemoglobin: 13.4 g/dL (ref 13.2–17.1)
Lymphs Abs: 3204 {cells}/uL (ref 850–3900)
MCH: 31 pg (ref 27.0–33.0)
MCHC: 33.3 g/dL (ref 32.0–36.0)
MCV: 93.1 fL (ref 80.0–100.0)
MPV: 10.8 fL (ref 7.5–12.5)
Monocytes Relative: 8.3 %
Neutro Abs: 2100 {cells}/uL (ref 1500–7800)
Neutrophils Relative %: 35 %
Platelets: 228 10*3/uL (ref 140–400)
RBC: 4.32 10*6/uL (ref 4.20–5.80)
RDW: 12.6 % (ref 11.0–15.0)
Total Lymphocyte: 53.4 %
WBC: 6 10*3/uL (ref 3.8–10.8)

## 2023-06-20 LAB — COMPLETE METABOLIC PANEL WITH GFR
AG Ratio: 1.6 (calc) (ref 1.0–2.5)
ALT: 20 U/L (ref 9–46)
AST: 21 U/L (ref 10–40)
Albumin: 4.1 g/dL (ref 3.6–5.1)
Alkaline phosphatase (APISO): 100 U/L (ref 36–130)
BUN/Creatinine Ratio: 9 (calc) (ref 6–22)
BUN: 12 mg/dL (ref 7–25)
CO2: 25 mmol/L (ref 20–32)
Calcium: 9.2 mg/dL (ref 8.6–10.3)
Chloride: 107 mmol/L (ref 98–110)
Creat: 1.3 mg/dL — ABNORMAL HIGH (ref 0.60–1.29)
Globulin: 2.6 g/dL (ref 1.9–3.7)
Glucose, Bld: 95 mg/dL (ref 65–99)
Potassium: 4 mmol/L (ref 3.5–5.3)
Sodium: 139 mmol/L (ref 135–146)
Total Bilirubin: 0.3 mg/dL (ref 0.2–1.2)
Total Protein: 6.7 g/dL (ref 6.1–8.1)
eGFR: 71 mL/min/{1.73_m2} (ref >=60)

## 2023-06-20 LAB — HIV-1 RNA QUANT-NO REFLEX-BLD
HIV 1 RNA Quant: <20 {copies}/mL — ABNORMAL HIGH
HIV-1 RNA Quant, Log: <1.3 {Log} — ABNORMAL HIGH

## 2023-06-20 LAB — RPR: RPR Ser Ql: NONREACTIVE

## 2023-07-02 ENCOUNTER — Ambulatory Visit: Payer: 59 | Admitting: Infectious Disease

## 2023-07-02 ENCOUNTER — Encounter: Payer: Self-pay | Admitting: Infectious Disease

## 2023-07-02 ENCOUNTER — Other Ambulatory Visit: Payer: Self-pay

## 2023-07-02 VITALS — BP 124/77 | HR 60 | Temp 98.1°F | Ht 69.0 in | Wt 199.0 lb

## 2023-07-02 DIAGNOSIS — F17200 Nicotine dependence, unspecified, uncomplicated: Secondary | ICD-10-CM

## 2023-07-02 DIAGNOSIS — G4700 Insomnia, unspecified: Secondary | ICD-10-CM

## 2023-07-02 DIAGNOSIS — B20 Human immunodeficiency virus [HIV] disease: Secondary | ICD-10-CM | POA: Diagnosis not present

## 2023-07-02 DIAGNOSIS — Z7185 Encounter for immunization safety counseling: Secondary | ICD-10-CM

## 2023-07-02 DIAGNOSIS — R5383 Other fatigue: Secondary | ICD-10-CM

## 2023-07-02 MED ORDER — BICTEGRAVIR-EMTRICITAB-TENOFOV 50-200-25 MG PO TABS
1.0000 | ORAL_TABLET | Freq: Every day | ORAL | 11 refills | Status: DC
  Filled 2023-07-03 – 2023-07-10 (×2): qty 30, 30d supply, fill #0
  Filled 2023-08-04: qty 30, 30d supply, fill #1
  Filled 2023-09-04: qty 30, 30d supply, fill #2
  Filled 2023-09-25: qty 30, 30d supply, fill #3
  Filled 2023-11-04: qty 30, 30d supply, fill #4
  Filled 2023-12-01: qty 30, 30d supply, fill #5
  Filled 2023-12-31: qty 30, 30d supply, fill #6

## 2023-07-02 NOTE — Progress Notes (Signed)
Subjective:   Chief complaint: Follow-up for HIV disease on medications still having some trouble with feeling fatigued due to poor sleep   Patient ID: Jeremy Harper, male    DOB: 1981-03-07, 42 y.o.   MRN: [id number]  HPI  Discussed the use of AI scribe software for clinical note transcription with the patient, who gave verbal consent to proceed.  History of Present Illness   The patient, with a history of HIV, presents for routine follow-up. He reports adherence to his current antiretroviral therapy, Biktarvy, and has maintained an undetectable viral load for several years. He expresses some confusion about recent lab results indicating a viral load of less than 20 but detected, which was clarified by the provider. His CD4 count remains in a healthy range.  The patient also reports persistent fatigue, which he attributes to a busy work schedule and lack of sleep. He describes feeling tired all the time, often falling asleep as soon as he sits down. Despite this, he has not noticed any episodes of waking up gasping for air during the night. He has considered a sleep study but has not pursued it yet.  The patient admits to smoking a small amount daily, primarily during work hours. He has not received a flu shot and expresses reluctance to receive additional vaccinations, including the COVID-19 vaccine.        Past Medical History:  Diagnosis Date   AIDS (HCC) 12/07/2015   Emphysema of lung (HCC) 01/02/2017   Fatigue 12/24/2022   Insomnia 12/24/2022   Rash and nonspecific skin eruption 12/07/2015   Smoker 01/02/2017   Thrush 12/07/2015   Vaccine counseling 12/24/2022   Zoster 12/28/2018    Past Surgical History:  Procedure Laterality Date   LAPAROSCOPY N/A 10/22/2019   Procedure: LAPAROSCOPY DIAGNOSTIC;  Surgeon: Diamantina Monks, MD;  Location: [facility];  Service: General;  Laterality: N/A;   LAPAROTOMY N/A 10/22/2019   Procedure: EXPLORATORY LAPAROTOMY;  Surgeon: Diamantina Monks, MD;  Location: [facility];  Service: General;  Laterality: N/A;   SMALL BOWEL REPAIR N/A 10/22/2019   Procedure: Small Bowel Repair;  Surgeon: Diamantina Monks, MD;  Location: [facility];  Service: General;  Laterality: N/A;    No family history on file.    Social History   Socioeconomic History   Marital status: Married    Spouse name: Not on file   Number of children: Not on file   Years of education: Not on file   Highest education level: Not on file  Occupational History   Not on file  Tobacco Use   Smoking status: Every Day    Types: Cigars, Cigarettes   Smokeless tobacco: Never   Tobacco comments:    Quit cigarettes, smokes 2 black and milds/day - states he is cutting back  Substance and Sexual Activity   Alcohol use: Yes    Alcohol/week: 2.0 standard drinks of alcohol    Types: 2 Standard drinks or equivalent per week    Comment: occasional   Drug use: Yes    Frequency: 2.0 times per week    Types: Marijuana    Comment: Smokes marijuana once or twice a week   Sexual activity: Yes    Partners: Female    Birth control/protection: Condom    Comment: declined condoms  Other Topics Concern   Not on file  Social History Narrative   Not on file   Social Determinants of Health   Financial Resource Strain: Not on file  Food Insecurity: Not on file  Transportation Needs: Not on file  Physical Activity: Not on file  Stress: Not on file  Social Connections: Unknown (01/21/2022)   Received from [facility]   Social Network    Social Network: Not on file    No Known Allergies   Current Outpatient Medications:    acetaminophen (TYLENOL) 500 MG tablet, Take 2 tablets (1,000 mg total) by mouth every 6 (six) hours as needed for mild pain., Disp: , Rfl:    albuterol (VENTOLIN HFA) 108 (90 Base) MCG/ACT inhaler, Inhale 1-2 puffs into the lungs every 4 (four) hours., Disp: 6.7 g, Rfl: 5   bictegravir-emtricitabine-tenofovir AF (BIKTARVY) 50-200-25 MG TABS  tablet, TAKE 1 TABLET BY MOUTH DAILY., Disp: 30 tablet, Rfl: 11   ibuprofen (ADVIL) 200 MG tablet, Take 3 tablets (600 mg total) by mouth every 6 (six) hours as needed for mild pain or moderate pain., Disp: , Rfl:    triamcinolone cream (KENALOG) 0.1 %, Apply  1 application topically 2 (two) times daily as needed., Disp: 60 g, Rfl: 3   Review of Systems  Constitutional:  Positive for fatigue. Negative for activity change, appetite change, chills, diaphoresis, fever and unexpected weight change.  HENT:  Negative for congestion, rhinorrhea, sinus pressure, sneezing, sore throat and trouble swallowing.   Eyes:  Negative for photophobia and visual disturbance.  Respiratory:  Negative for cough, chest tightness, shortness of breath, wheezing and stridor.   Cardiovascular:  Negative for chest pain, palpitations and leg swelling.  Gastrointestinal:  Negative for abdominal distention, abdominal pain, anal bleeding, blood in stool, constipation, diarrhea, nausea and vomiting.  Genitourinary:  Negative for difficulty urinating, dysuria, flank pain and hematuria.  Musculoskeletal:  Negative for arthralgias, back pain, gait problem, joint swelling and myalgias.  Skin:  Negative for color change, pallor, rash and wound.  Neurological:  Negative for dizziness, tremors, weakness and light-headedness.  Hematological:  Negative for adenopathy. Does not bruise/bleed easily.  Psychiatric/Behavioral:  Positive for sleep disturbance. Negative for agitation, behavioral problems, confusion, decreased concentration and dysphoric mood.        Objective:   Physical Exam Constitutional:      Appearance: He is well-developed.  HENT:     Head: Normocephalic and atraumatic.  Eyes:     Conjunctiva/sclera: Conjunctivae normal.  Cardiovascular:     Rate and Rhythm: Normal rate and regular rhythm.  Pulmonary:     Effort: Pulmonary effort is normal. No respiratory distress.     Breath sounds: No wheezing.  Abdominal:      General: There is no distension.     Palpations: Abdomen is soft.  Musculoskeletal:        General: No tenderness. Normal range of motion.     Cervical back: Normal range of motion and neck supple.  Skin:    General: Skin is warm and dry.     Coloration: Skin is not pale.     Findings: No erythema or rash.  Neurological:     General: No focal deficit present.     Mental Status: He is alert and oriented to person, place, and time.  Psychiatric:        Mood and Affect: Mood normal.        Behavior: Behavior normal.        Thought Content: Thought content normal.        Judgment: Judgment normal.           Assessment & Plan:   Assessment  and Plan    HIV Viral load less than 20, indicating effective suppression. Discussed the lab's reporting of "less than 20 but detected" and reassured that this is not clinically significant. -Continue current antiretroviral therapy (Biktarvy).  Chronic fatigue Reports feeling tired all the time, falling asleep easily when sitting down. No reported sleep apnea symptoms. Discussed potential contribution of poor sleep hygiene and busy lifestyle. -Consider referral for sleep study if symptoms persist after improving sleep hygiene.  Tobacco use Reports occasional smoking (1-2 cigarettes equivalent per day). Discussed potential health risks. -Encourage smoking cessation.  Cardiovascular risk Discussed recent study findings suggesting potential benefits of statin therapy in HIV patients for reducing inflammation and potentially lowering risk of heart disease and certain cancers. Patient is over 40 and may benefit from this preventative measure. -Discuss initiation of low-dose statin therapy at next visit.  Vaccinations Patient declined flu shot and did not confirm if he had received COVID-19 vaccine.   Follow-up in 6 months.

## 2023-07-03 ENCOUNTER — Other Ambulatory Visit (HOSPITAL_COMMUNITY): Payer: Self-pay

## 2023-07-10 ENCOUNTER — Other Ambulatory Visit: Payer: Self-pay

## 2023-07-10 ENCOUNTER — Other Ambulatory Visit (HOSPITAL_COMMUNITY): Payer: Self-pay

## 2023-07-10 NOTE — Progress Notes (Signed)
Specialty Pharmacy Refill Coordination Note  CHA PRESS is a 42 y.o. male contacted today regarding refills of specialty medication(s) Bictegravir-Emtricitab-Tenofov   Patient requested Delivery   Delivery date: 07/15/23   Verified address: [address]  [name] [address]   Medication will be filled on 07/14/23.

## 2023-07-14 ENCOUNTER — Other Ambulatory Visit: Payer: Self-pay

## 2023-08-04 ENCOUNTER — Other Ambulatory Visit: Payer: Self-pay

## 2023-08-04 ENCOUNTER — Other Ambulatory Visit (HOSPITAL_COMMUNITY): Payer: Self-pay

## 2023-08-04 NOTE — Progress Notes (Signed)
Specialty Pharmacy Refill Coordination Note  Jeremy Harper is a 42 y.o. male contacted today regarding refills of specialty medication(s) Bictegravir-Emtricitab-Tenofov   Patient requested Delivery   Delivery date: 08/14/23   Verified address: [address]  [name] [address]   Medication will be filled on 08/13/23.

## 2023-08-13 ENCOUNTER — Other Ambulatory Visit: Payer: Self-pay

## 2023-09-04 ENCOUNTER — Other Ambulatory Visit (HOSPITAL_COMMUNITY): Payer: Self-pay

## 2023-09-04 NOTE — Progress Notes (Signed)
Specialty Pharmacy Refill Coordination Note  Jeremy Harper is a 42 y.o. male contacted today regarding refills of specialty medication(s) Bictegravir-Emtricitab-Tenofov [name])   Patient requested Delivery   Delivery date: 09/09/23   Verified address: [address] [name] [address]   Medication will be filled on 09/08/23.

## 2023-09-04 NOTE — Progress Notes (Signed)
Specialty Pharmacy Ongoing Clinical Assessment Note  Jeremy Harper is a 42 y.o. male who is being followed by the specialty pharmacy service for RxSp HIV   Patient's specialty medication(s) reviewed today: Bictegravir-Emtricitab-Tenofov (BIKTARVY)   Missed doses in the last 4 weeks: 0   Patient/Caregiver did not have any additional questions or concerns.   Therapeutic benefit summary: Patient is achieving benefit   Adverse events/side effects summary: No adverse events/side effects   Patient's therapy is appropriate to: Continue    Goals Addressed             This Visit's Progress    Achieve Undetectable HIV Viral Load < 20       Patient is on track. Patient will maintain adherence.  Recent viral load on 06/18/23 was undetectable.          Follow up:  6 months  Servando Snare Specialty Pharmacist

## 2023-09-08 ENCOUNTER — Other Ambulatory Visit: Payer: Self-pay

## 2023-09-10 DIAGNOSIS — S4990XA Unspecified injury of shoulder and upper arm, unspecified arm, initial encounter: Secondary | ICD-10-CM

## 2023-09-10 HISTORY — DX: Unspecified injury of shoulder and upper arm, unspecified arm, initial encounter: S49.90XA

## 2023-09-25 ENCOUNTER — Other Ambulatory Visit (HOSPITAL_COMMUNITY): Payer: Self-pay

## 2023-09-25 NOTE — Progress Notes (Signed)
Specialty Pharmacy Refill Coordination Note  Jeremy Harper is a 43 y.o. male contacted today regarding refills of specialty medication(s) Bictegravir-Emtricitab-Tenofov [name])   Patient requested Delivery   Delivery date: 10/07/23   Verified address: [address] [name] [address]   Medication will be filled on 10/06/23.

## 2023-10-06 ENCOUNTER — Other Ambulatory Visit: Payer: Self-pay

## 2023-10-21 ENCOUNTER — Encounter (HOSPITAL_COMMUNITY): Payer: Self-pay

## 2023-10-21 ENCOUNTER — Ambulatory Visit (HOSPITAL_COMMUNITY)
Admission: EM | Admit: 2023-10-21 | Discharge: 2023-10-21 | Disposition: A | Discharge status: Home / Self Care | Payer: 59 | Attending: Family Medicine | Admitting: Family Medicine

## 2023-10-21 DIAGNOSIS — R0789 Other chest pain: Secondary | ICD-10-CM

## 2023-10-21 DIAGNOSIS — M549 Dorsalgia, unspecified: Secondary | ICD-10-CM | POA: Diagnosis not present

## 2023-10-21 DIAGNOSIS — M542 Cervicalgia: Secondary | ICD-10-CM

## 2023-10-21 MED ORDER — IBUPROFEN 600 MG PO TABS: 600.0000 mg | ORAL_TABLET | Freq: Four times a day (QID) | ORAL | 0 refills | Status: AC | PRN

## 2023-10-21 MED ORDER — KETOROLAC TROMETHAMINE 30 MG/ML IJ SOLN
30.0000 mg | Freq: Once | INTRAMUSCULAR | Status: AC
  Administered 2023-10-21: 30 mg via INTRAMUSCULAR

## 2023-10-21 MED ORDER — TIZANIDINE HCL 4 MG PO TABS
4.0000 mg | ORAL_TABLET | Freq: Three times a day (TID) | ORAL | 0 refills | Status: AC | PRN
Start: 2023-10-21 — End: ?

## 2023-10-21 MED ORDER — KETOROLAC TROMETHAMINE 30 MG/ML IJ SOLN
INTRAMUSCULAR | Status: AC
  Filled 2023-10-21: qty 1

## 2023-10-21 NOTE — Discharge Instructions (Addendum)
You were seen today for neck, back and chest pain.  This appears very muscular in nature.  I have given you a shot of a pain medication while here.  This usually takes effect within 30 mins.  I have sent out a muscle relaxer to help with pain.  This may make you tired/drowsy so please take when home and not driving.  I have also sent out an anti-inflammatory.  Please use heat/ice for relief.  Follow up if not improving or worsening despite medication.
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
## 2023-10-21 NOTE — ED Triage Notes (Signed)
Pt c/o of neck pain that progressed to right shoulder/arm pian then right leg pain and then right chest/back pain. The leg pain has now resolved. He says his neck and back feel stiff.   Interventions: Ibuprofen (Last hour), no relief

## 2023-10-28 ENCOUNTER — Other Ambulatory Visit: Payer: Self-pay

## 2023-11-04 ENCOUNTER — Other Ambulatory Visit: Payer: Self-pay

## 2023-11-06 ENCOUNTER — Other Ambulatory Visit (HOSPITAL_COMMUNITY): Payer: Self-pay

## 2023-11-06 ENCOUNTER — Other Ambulatory Visit: Payer: Self-pay

## 2023-11-10 ENCOUNTER — Other Ambulatory Visit: Payer: Self-pay

## 2023-11-10 NOTE — Progress Notes (Signed)
 Specialty Pharmacy Refill Coordination Note  Jeremy Harper is a 43 y.o. male contacted today regarding refills of specialty medication(s) Bictegravir-Emtricitab-Tenofov [name])   Patient requested Delivery   Delivery date: 11/07/23   Verified address: [address] [name] [address]   Medication will be filled on 02.27.25.   Spoke with patient 2.25.25.

## 2023-11-25 ENCOUNTER — Encounter: Payer: Self-pay | Admitting: Physical Therapy

## 2023-11-25 ENCOUNTER — Ambulatory Visit: Attending: Physician Assistant | Admitting: Physical Therapy

## 2023-11-25 ENCOUNTER — Other Ambulatory Visit: Payer: Self-pay

## 2023-11-25 DIAGNOSIS — M6281 Muscle weakness (generalized): Secondary | ICD-10-CM | POA: Diagnosis present

## 2023-11-25 DIAGNOSIS — M25511 Pain in right shoulder: Secondary | ICD-10-CM | POA: Diagnosis present

## 2023-11-25 DIAGNOSIS — M79601 Pain in right arm: Secondary | ICD-10-CM | POA: Diagnosis present

## 2023-11-25 DIAGNOSIS — G8929 Other chronic pain: Secondary | ICD-10-CM | POA: Insufficient documentation

## 2023-11-25 DIAGNOSIS — M542 Cervicalgia: Secondary | ICD-10-CM | POA: Insufficient documentation

## 2023-11-25 NOTE — Patient Instructions (Addendum)
 Access Code: [email or web address] URL: [email or web address] Date: 11/25/2023 Prepared by: Rosana Hoes  Exercises - Seated Cervical Sidebending Stretch  - 2 x daily - 3 reps - 15 seconds hold - Seated Neck Retraction  - 3-5 x daily - 10 reps - 5 seconds hold   Trigger Point Dry Needling  What is Trigger Point Dry Needling (DN)? DN is a physical therapy technique used to treat muscle pain and dysfunction. Specifically, DN helps deactivate muscle trigger points (muscle knots).  A thin filiform needle is used to penetrate the skin and stimulate the underlying trigger point. The goal is for a local twitch response (LTR) to occur and for the trigger point to relax. No medication of any kind is injected during the procedure.   What Does Trigger Point Dry Needling Feel Like?  The procedure feels different for each individual patient. Some patients report that they do not actually feel the needle enter the skin and overall the process is not painful. Very mild bleeding may occur. However, many patients feel a deep cramping in the muscle in which the needle was inserted. This is the local twitch response.   How Will I feel after the treatment? Soreness is normal, and the onset of soreness may not occur for a few hours. Typically this soreness does not last longer than two days.  Bruising is uncommon, however; ice can be used to decrease any possible bruising.  In rare cases feeling tired or nauseous after the treatment is normal. In addition, your symptoms may get worse before they get better, this period will typically not last longer than 24 hours.   What Can I do After My Treatment? Increase your hydration by drinking more water for the next 24 hours.  You may place ice or heat on the areas treated that have become sore, however, do not use heat on inflamed or bruised areas. Heat often brings more relief post needling. You can continue your regular activities, but vigorous activity is not  recommended initially after the treatment for 24 hours. DN is best combined with other physical therapy such as strengthening, stretching, and other therapies.   What are the complications? While your therapist has had extensive training in minimizing the risks of trigger point dry needling, it is important to understand the risks of any procedure.  Risks include bleeding, pain, fatigue, hematoma, infection, vertigo, nausea or nerve involvement. Monitor for any changes to your skin or sensation. Contact your therapist or MD with concerns.  A rare but serious complication is a pneumothorax over or near your middle and upper chest and back If you have dry needling in this area, monitor for the following symptoms: Shortness of breath on exertion and/or Difficulty taking a deep breath and/or Chest Pain and/or A dry cough If any of the above symptoms develop, please go to the nearest emergency room or call 911. Tell them you had dry needling over your thorax and report any symptoms you are having. Please follow-up with your treating therapist after you complete the medical evaluation.

## 2023-11-25 NOTE — Therapy (Signed)
 OUTPATIENT PHYSICAL THERAPY EVALUATION   Patient Name: Jeremy Harper MRN: [id number] DOB:September 25, 1980, 43 y.o., male Today's Date: 11/25/2023   END OF SESSION:  PT End of Session - 11/25/23 1022     Visit Number 1    Number of Visits 17    Date for PT Re-Evaluation 01/20/24    Authorization Type [employer]    PT Start Time 0845    PT Stop Time 0930    PT Time Calculation (min) 45 min    Activity Tolerance Patient tolerated treatment well    Behavior During Therapy [facility] for tasks assessed/performed             Past Medical History:  Diagnosis Date   AIDS (HCC) 12/07/2015   Emphysema of lung (HCC) 01/02/2017   Fatigue 12/24/2022   Insomnia 12/24/2022   Rash and nonspecific skin eruption 12/07/2015   Shoulder injury 2025   Smoker 01/02/2017   Thrush 12/07/2015   Vaccine counseling 12/24/2022   Zoster 12/28/2018   Past Surgical History:  Procedure Laterality Date   LAPAROSCOPY N/A 10/22/2019   Procedure: LAPAROSCOPY DIAGNOSTIC;  Surgeon: Diamantina Monks, MD;  Location: [facility] OR;  Service: General;  Laterality: N/A;   LAPAROTOMY N/A 10/22/2019   Procedure: EXPLORATORY LAPAROTOMY;  Surgeon: Diamantina Monks, MD;  Location: [facility] OR;  Service: General;  Laterality: N/A;   SMALL BOWEL REPAIR N/A 10/22/2019   Procedure: Small Bowel Repair;  Surgeon: Diamantina Monks, MD;  Location: [facility] OR;  Service: General;  Laterality: N/A;   Patient Active Problem List   Diagnosis Date Noted   Vaccine counseling 12/24/2022   Insomnia 12/24/2022   Fatigue 12/24/2022   Contusion 10/22/2019   Zoster 12/28/2018   Emphysema of lung (HCC) 01/02/2017   Smoker 01/02/2017   Rash and nonspecific skin eruption 12/07/2015   HIV disease (HCC) 03/06/2010    PCP: None  REFERRING PROVIDER: Dion Saucier D, PA  REFERRING DIAG: neck pain  THERAPY DIAG:  Cervicalgia  Chronic right shoulder pain  Pain in right arm  Muscle weakness (generalized)  Rationale for Evaluation and Treatment:  Rehabilitation  ONSET DATE: Chronic since 2021   SUBJECTIVE SUBJECTIVE STATEMENT: Patient reports in 2021 he had an accident where he had surgery, then when he got home his neck, right shoulder, chest, and upper back hurt. Then about 4 years later (2025) the pain in the right neck, shoulder, and arm started hurting again with no mechanism. He was given a steroid shot and prescription. He did have rotator cuff surgery on the left side and the right shoulder started feeling the same. The doctor told him he may have a pinched nerve in the right side. He also notes some pain in his legs that came on with his neck pain as well. He reports that currently when he has pain it is mainly on the right side from the right neck, shoulder, arm, and even into the leg. He also wakes up hurting so is wondering if how he sleeps affects his pain. Very rarely his right hand will tingle but denies much numbness. When his right shoulder is bothering him he has trouble lifting it. He did have an MRI on his neck yesterday and follows up with his provider on 12/03/2023.  Hand dominance: Right  PERTINENT HISTORY:  See PMH above  PAIN:  Are you having pain? Yes:  NPRS scale: 3/10 currently, 6-8/10 at worst Pain location: Right neck, shoulder, arm, upper back Pain description: Aching, tired, sharp Aggravating factors:  Sitting, turning head to right Relieving factors: Medication  PRECAUTIONS: None  RED FLAGS: None    WEIGHT BEARING RESTRICTIONS: No  FALLS:  Has patient fallen in last 6 months? No  OCCUPATION: Out of work, oversee and operating machines for paint, forklift driving, stacking cans  PLOF: Independent  PATIENT GOALS: Pain relief so he can return to work   OBJECTIVE:  Note: Objective measures were completed at Evaluation unless otherwise noted. PATIENT SURVEYS:  NDI 24/50 (48%)  COGNITION: Overall cognitive status: Within functional limits for tasks assessed  SENSATION: WFL  POSTURE:    Rounded shoulder and forward head  PALPATION: Tender to palpation right upper trap region, cervical paraspinals and suboccipitals,    CERVICAL ROM:   Active ROM A/PROM (deg) eval  Flexion 55  Extension 40  Right lateral flexion 25  Left lateral flexion 45  Right rotation 50  Left rotation 55   (Blank rows = not tested)  UPPER EXTREMITY ROM:   UE ROM grossly WFL  UPPER EXTREMITY MMT:  MMT Right eval Left eval  Shoulder flexion 4 5  Shoulder extension 5 5  Shoulder abduction 4 5  Shoulder adduction    Shoulder extension    Shoulder internal rotation 5 5  Shoulder external rotation 5 5  Middle trapezius 4- 4-  Lower trapezius 4- 4-  Elbow flexion    Elbow extension    Wrist flexion    Wrist extension    Wrist ulnar deviation    Wrist radial deviation    Wrist pronation    Wrist supination    Grip strength     (Blank rows = not tested)  CERVICAL SPECIAL TESTS:  Spurling's positive on right  FUNCTIONAL TESTS:  DNF endurance: 9 seconds   TREATMENT [facility] Adult PT Treatment:                                                DATE: 11/25/2023 Seated upper trap stretch Seated cervical retraction  Trigger Point Dry Needling  Initial Treatment: Pt instructed on Dry Needling rational, procedures, and possible side effects. Pt instructed to expect mild to moderate muscle soreness later in the day and/or into the next day.  Pt instructed in methods to reduce muscle soreness. Pt instructed to continue prescribed HEP. Because Dry Needling was performed over or adjacent to a lung field, pt was educated on S/S of pneumothorax and to seek immediate medical attention should they occur.  Patient was educated on signs and symptoms of infection and other risk factors and advised to seek medical attention should they occur.  Patient verbalized understanding of these instructions and education.   Patient Verbal Consent Given: Yes Education Handout Provided: Yes Muscles  Treated: Right upper trap and suboccipitals Electrical Stimulation Performed: No Treatment Response/Outcome: Twitch response   PATIENT EDUCATION:  Education details: Exam findings, POC, HEP, TPDN Person educated: Patient Education method: Explanation, Demonstration, Tactile cues, Verbal cues, and Handouts Education comprehension: verbalized understanding, returned demonstration, verbal cues required, tactile cues required, and needs further education  HOME EXERCISE PROGRAM: Access Code: [email or web address]    ASSESSMENT: CLINICAL IMPRESSION: Patient is a 43 y.o. male who was seen today for physical therapy evaluation and treatment primarily for chronic right sided neck, shoulder, and arm pain. He also notes occasional back, chest, and LE pain. Currently his symptoms do seem consistent with right cervical  radiculopathic pain. He exhibits limitations with his cervical motion and with increased muscular tension on right side. Postural deviations with DNF endurance deficit and periscapular strength impairment.   OBJECTIVE IMPAIRMENTS: decreased activity tolerance, decreased ROM, decreased strength, postural dysfunction, and pain.   ACTIVITY LIMITATIONS: lifting, sitting, and sleeping  PARTICIPATION LIMITATIONS: occupation  PERSONAL FACTORS: Past/current experiences and Time since onset of injury/illness/exacerbation are also affecting patient's functional outcome.   REHAB POTENTIAL: Good  CLINICAL DECISION MAKING: Stable/uncomplicated  EVALUATION COMPLEXITY: Low   GOALS: Goals reviewed with patient? Yes  SHORT TERM GOALS: Target date: 12/23/2023  Patient will be I with initial HEP in order to progress with therapy. Baseline: HEP provided at eval Goal status: INITIAL  2.  Patient will demonstrate improvement in cervical rotation >/= 10 deg bilaterally in order to improve mobility and reduce neck pain Baseline: see limitations above Goal status: INITIAL  LONG TERM GOALS: Target date:  01/20/2024  Patient will be I with final HEP to maintain progress from PT. Baseline: HEP provided at eval Goal status: INITIAL  2.  Patient will report </= 24% disability on the NDI in order to indicate improvement in functional status Baseline: 48% Goal status: INITIAL  3.  Patient will demonstrate DNF endurance >/= 30 seconds to improve postural control and reduce neck pain Baseline: 9 seconds Goal status: INITIAL  4.  Patient will report neck and shoulder/arm pain </= 4/10 with all activity in order to reduce functional limitations and return to work Baseline: 6-8/10 Goal status: INITIAL   PLAN: PT FREQUENCY: 1-2x/week  PT DURATION: 8 weeks  PLANNED INTERVENTIONS: 97164- PT Re-evaluation, 97110-Therapeutic exercises, 97530- Therapeutic activity, 97112- Neuromuscular re-education, 97535- Self Care, 44034- Manual therapy, [id number]- Electrical stimulation (unattended), [phone]- Electrical stimulation (manual), 97012- Traction (mechanical), Patient/Family education, Taping, Dry Needling, Joint mobilization, Joint manipulation, Spinal manipulation, Spinal mobilization, Cryotherapy, and Moist heat  PLAN FOR NEXT SESSION: Review HEP and progress PRN, manual/TPDN for right shoulder and neck region, cervical mobs to improve motion and reduce pain, assess neurodynamics, progress postural exercises    Rosana Hoes, PT, DPT, LAT, ATC 11/25/23  1:14 PM Phone: [phone] Fax: [phone]

## 2023-12-01 ENCOUNTER — Other Ambulatory Visit: Payer: Self-pay

## 2023-12-01 NOTE — Progress Notes (Signed)
 Specialty Pharmacy Refill Coordination Note  GLENROY CROSSEN is a 43 y.o. male contacted today regarding refills of specialty medication(s) Bictegravir-Emtricitab-Tenofov [name])   Patient requested Delivery   Delivery date: 12/03/23   Verified address: [address]   [name] [address]   Medication will be filled on 12/02/23.

## 2023-12-02 ENCOUNTER — Other Ambulatory Visit: Payer: Self-pay

## 2023-12-02 ENCOUNTER — Ambulatory Visit

## 2023-12-02 DIAGNOSIS — M542 Cervicalgia: Secondary | ICD-10-CM

## 2023-12-02 DIAGNOSIS — M6281 Muscle weakness (generalized): Secondary | ICD-10-CM

## 2023-12-02 DIAGNOSIS — M79601 Pain in right arm: Secondary | ICD-10-CM

## 2023-12-02 DIAGNOSIS — G8929 Other chronic pain: Secondary | ICD-10-CM

## 2023-12-02 NOTE — Therapy (Signed)
 OUTPATIENT PHYSICAL THERAPY TREATMENT   Patient Name: Jeremy Harper MRN: [id number] DOB:1981/01/07, 43 y.o., male Today's Date: 12/02/2023   END OF SESSION:  PT End of Session - 12/02/23 0849     Visit Number 2    Number of Visits 17    Date for PT Re-Evaluation 01/20/24    Authorization Type [employer]    PT Start Time 0849   arrived late   PT Stop Time 0927    PT Time Calculation (min) 38 min    Activity Tolerance Patient tolerated treatment well    Behavior During Therapy [facility] for tasks assessed/performed              Past Medical History:  Diagnosis Date   AIDS (HCC) 12/07/2015   Emphysema of lung (HCC) 01/02/2017   Fatigue 12/24/2022   Insomnia 12/24/2022   Rash and nonspecific skin eruption 12/07/2015   Shoulder injury 2025   Smoker 01/02/2017   Thrush 12/07/2015   Vaccine counseling 12/24/2022   Zoster 12/28/2018   Past Surgical History:  Procedure Laterality Date   LAPAROSCOPY N/A 10/22/2019   Procedure: LAPAROSCOPY DIAGNOSTIC;  Surgeon: Diamantina Monks, MD;  Location: [facility] OR;  Service: General;  Laterality: N/A;   LAPAROTOMY N/A 10/22/2019   Procedure: EXPLORATORY LAPAROTOMY;  Surgeon: Diamantina Monks, MD;  Location: [facility] OR;  Service: General;  Laterality: N/A;   SMALL BOWEL REPAIR N/A 10/22/2019   Procedure: Small Bowel Repair;  Surgeon: Diamantina Monks, MD;  Location: [facility] OR;  Service: General;  Laterality: N/A;   Patient Active Problem List   Diagnosis Date Noted   Vaccine counseling 12/24/2022   Insomnia 12/24/2022   Fatigue 12/24/2022   Contusion 10/22/2019   Zoster 12/28/2018   Emphysema of lung (HCC) 01/02/2017   Smoker 01/02/2017   Rash and nonspecific skin eruption 12/07/2015   HIV disease (HCC) 03/06/2010    PCP: None  REFERRING PROVIDER: Dion Saucier D, PA  REFERRING DIAG: neck pain  THERAPY DIAG:  Cervicalgia  Chronic right shoulder pain  Pain in right arm  Muscle weakness (generalized)  Rationale for Evaluation and  Treatment: Rehabilitation  ONSET DATE: Chronic since 2021   SUBJECTIVE SUBJECTIVE STATEMENT: Pt presents to PT with reports of continued R sided neck tightness and R shoulder discomfort. Has been compliant with HEP with no adverse effect.   EVAL: Patient reports in 2021 he had an accident where he had surgery, then when he got home his neck, right shoulder, chest, and upper back hurt. Then about 4 years later (2025) the pain in the right neck, shoulder, and arm started hurting again with no mechanism. He was given a steroid shot and prescription. He did have rotator cuff surgery on the left side and the right shoulder started feeling the same. The doctor told him he may have a pinched nerve in the right side. He also notes some pain in his legs that came on with his neck pain as well. He reports that currently when he has pain it is mainly on the right side from the right neck, shoulder, arm, and even into the leg. He also wakes up hurting so is wondering if how he sleeps affects his pain. Very rarely his right hand will tingle but denies much numbness. When his right shoulder is bothering him he has trouble lifting it. He did have an MRI on his neck yesterday and follows up with his provider on 12/03/2023.  Hand dominance: Right  PERTINENT HISTORY:  See PMH  above  PAIN:  Are you having pain? Yes:  NPRS scale: 3/10 currently, 6-8/10 at worst Pain location: Right neck, shoulder, arm, upper back Pain description: Aching, tired, sharp Aggravating factors: Sitting, turning head to right Relieving factors: Medication  PRECAUTIONS: None  RED FLAGS: None    WEIGHT BEARING RESTRICTIONS: No  FALLS:  Has patient fallen in last 6 months? No  OCCUPATION: Out of work, oversee and operating machines for paint, forklift driving, stacking cans  PLOF: Independent  PATIENT GOALS: Pain relief so he can return to work   OBJECTIVE:  Note: Objective measures were completed at Evaluation unless  otherwise noted. PATIENT SURVEYS:  NDI 24/50 (48%)  COGNITION: Overall cognitive status: Within functional limits for tasks assessed  SENSATION: WFL  POSTURE:   Rounded shoulder and forward head  PALPATION: Tender to palpation right upper trap region, cervical paraspinals and suboccipitals,    CERVICAL ROM:   Active ROM A/PROM (deg) eval  Flexion 55  Extension 40  Right lateral flexion 25  Left lateral flexion 45  Right rotation 50  Left rotation 55   (Blank rows = not tested)  UPPER EXTREMITY ROM:   UE ROM grossly WFL  UPPER EXTREMITY MMT:  MMT Right eval Left eval  Shoulder flexion 4 5  Shoulder extension 5 5  Shoulder abduction 4 5  Shoulder adduction    Shoulder extension    Shoulder internal rotation 5 5  Shoulder external rotation 5 5  Middle trapezius 4- 4-  Lower trapezius 4- 4-  Elbow flexion    Elbow extension    Wrist flexion    Wrist extension    Wrist ulnar deviation    Wrist radial deviation    Wrist pronation    Wrist supination    Grip strength     (Blank rows = not tested)  CERVICAL SPECIAL TESTS:  Spurling's positive on right  FUNCTIONAL TESTS:  DNF endurance: 9 seconds   TREATMENT [facility] Adult PT Treatment:                                                DATE: 11/25/2023 Therapeutic Exercise:  UBE lvl 1.5 x 4 min (fwd/bwd) for functional endurance FM row 2x10 20# Seated upper trap stretch R x 30" Supine chin tuck 2x10 - 3" hold Supine horizontal abd 2x15 GTB S/L open book x 10 each Seated bilateral ER 2x15 GTB Manual Therapy:  Positional release R upper trap Suboccipital release Manual cervical traction  [facility] Adult PT Treatment:                                                DATE: 11/25/2023 Seated upper trap stretch Seated cervical retraction  Trigger Point Dry Needling  Initial Treatment: Pt instructed on Dry Needling rational, procedures, and possible side effects. Pt instructed to expect mild to moderate muscle  soreness later in the day and/or into the next day.  Pt instructed in methods to reduce muscle soreness. Pt instructed to continue prescribed HEP. Because Dry Needling was performed over or adjacent to a lung field, pt was educated on S/S of pneumothorax and to seek immediate medical attention should they occur.  Patient was educated on signs and symptoms of infection  and other risk factors and advised to seek medical attention should they occur.  Patient verbalized understanding of these instructions and education.   Patient Verbal Consent Given: Yes Education Handout Provided: Yes Muscles Treated: Right upper trap and suboccipitals Electrical Stimulation Performed: No Treatment Response/Outcome: Twitch response   PATIENT EDUCATION:  Education details: HEP update Person educated: Patient Education method: Explanation, Demonstration, Tactile cues, Verbal cues, and Handouts Education comprehension: verbalized understanding, returned demonstration, verbal cues required, tactile cues required, and needs further education  HOME EXERCISE PROGRAM: Access Code: [email or web address] URL: [email or web address] Date: 12/02/2023 Prepared by: Edwinna Areola  Exercises - Seated Cervical Sidebending Stretch  - 2 x daily - 3 reps - 15 seconds hold - Seated Neck Retraction  - 3-5 x daily - 10 reps - 5 seconds hold - Supine Chin Tuck  - 1 x daily - 7 x weekly - 2 sets - 10 reps - 3 sec hold - Supine Shoulder Horizontal Abduction with Resistance  - 1 x daily - 7 x weekly - 3 sets - 10 reps - green band hold - Shoulder External Rotation and Scapular Retraction with Resistance  - 1 x daily - 7 x weekly - 2-3 sets - 10 reps - green band hold - Standing Shoulder Row with Anchored Resistance  - 1 x daily - 7 x weekly - 3 sets - 10 reps - blue band hold   ASSESSMENT: CLINICAL IMPRESSION: Pt was able to complete prescribed exercises with no adverse effect. Exercises today focused on improving DNF and  periscapular strength as well as thoracic mobility in order to decrease neck pain. Noted slight decrease in pain post manual therapy interventions. HEP updated for continued postural strengthening. Progressing with therapy, consider TPDN once again at next session.   EVAL: Patient is a 43 y.o. male who was seen today for physical therapy evaluation and treatment primarily for chronic right sided neck, shoulder, and arm pain. He also notes occasional back, chest, and LE pain. Currently his symptoms do seem consistent with right cervical radiculopathic pain. He exhibits limitations with his cervical motion and with increased muscular tension on right side. Postural deviations with DNF endurance deficit and periscapular strength impairment.   OBJECTIVE IMPAIRMENTS: decreased activity tolerance, decreased ROM, decreased strength, postural dysfunction, and pain.   ACTIVITY LIMITATIONS: lifting, sitting, and sleeping  PARTICIPATION LIMITATIONS: occupation  PERSONAL FACTORS: Past/current experiences and Time since onset of injury/illness/exacerbation are also affecting patient's functional outcome.   REHAB POTENTIAL: Good  CLINICAL DECISION MAKING: Stable/uncomplicated  EVALUATION COMPLEXITY: Low   GOALS: Goals reviewed with patient? Yes  SHORT TERM GOALS: Target date: 12/23/2023  Patient will be I with initial HEP in order to progress with therapy. Baseline: HEP provided at eval Goal status: INITIAL  2.  Patient will demonstrate improvement in cervical rotation >/= 10 deg bilaterally in order to improve mobility and reduce neck pain Baseline: see limitations above Goal status: INITIAL  LONG TERM GOALS: Target date: 01/20/2024  Patient will be I with final HEP to maintain progress from PT. Baseline: HEP provided at eval Goal status: INITIAL  2.  Patient will report </= 24% disability on the NDI in order to indicate improvement in functional status Baseline: 48% Goal status:  INITIAL  3.  Patient will demonstrate DNF endurance >/= 30 seconds to improve postural control and reduce neck pain Baseline: 9 seconds Goal status: INITIAL  4.  Patient will report neck and shoulder/arm pain </= 4/10 with all activity in order to reduce functional limitations  and return to work Baseline: 6-8/10 Goal status: INITIAL   PLAN: PT FREQUENCY: 1-2x/week  PT DURATION: 8 weeks  PLANNED INTERVENTIONS: 97164- PT Re-evaluation, 97110-Therapeutic exercises, 97530- Therapeutic activity, 97112- Neuromuscular re-education, 97535- Self Care, 86578- Manual therapy, G0283- Electrical stimulation (unattended), [phone]- Electrical stimulation (manual), [id number]- Traction (mechanical), Patient/Family education, Taping, Dry Needling, Joint mobilization, Joint manipulation, Spinal manipulation, Spinal mobilization, Cryotherapy, and Moist heat  PLAN FOR NEXT SESSION: Review HEP and progress PRN, manual/TPDN for right shoulder and neck region, cervical mobs to improve motion and reduce pain, assess neurodynamics, progress postural exercises  Eloy End PT  12/02/23 9:29 AM

## 2023-12-04 ENCOUNTER — Encounter: Payer: Self-pay | Admitting: Physical Therapy

## 2023-12-04 ENCOUNTER — Ambulatory Visit: Admitting: Physical Therapy

## 2023-12-04 DIAGNOSIS — M542 Cervicalgia: Secondary | ICD-10-CM

## 2023-12-04 DIAGNOSIS — M6281 Muscle weakness (generalized): Secondary | ICD-10-CM

## 2023-12-04 DIAGNOSIS — M79601 Pain in right arm: Secondary | ICD-10-CM

## 2023-12-04 DIAGNOSIS — G8929 Other chronic pain: Secondary | ICD-10-CM

## 2023-12-04 NOTE — Therapy (Signed)
 OUTPATIENT PHYSICAL THERAPY TREATMENT   Patient Name: Jeremy Harper MRN: [id number] DOB:1981-05-17, 43 y.o., male Today's Date: 12/04/2023   END OF SESSION:  PT End of Session - 12/04/23 0756     Visit Number 3    Number of Visits 17    Date for PT Re-Evaluation 01/20/24    Authorization Type [employer]    PT Start Time 0757   pt arrived late   PT Stop Time 0827    PT Time Calculation (min) 30 min    Activity Tolerance Patient tolerated treatment well    Behavior During Therapy [facility] for tasks assessed/performed              Past Medical History:  Diagnosis Date   AIDS (HCC) 12/07/2015   Emphysema of lung (HCC) 01/02/2017   Fatigue 12/24/2022   Insomnia 12/24/2022   Rash and nonspecific skin eruption 12/07/2015   Shoulder injury 2025   Smoker 01/02/2017   Thrush 12/07/2015   Vaccine counseling 12/24/2022   Zoster 12/28/2018   Past Surgical History:  Procedure Laterality Date   LAPAROSCOPY N/A 10/22/2019   Procedure: LAPAROSCOPY DIAGNOSTIC;  Surgeon: Diamantina Monks, MD;  Location: [facility] OR;  Service: General;  Laterality: N/A;   LAPAROTOMY N/A 10/22/2019   Procedure: EXPLORATORY LAPAROTOMY;  Surgeon: Diamantina Monks, MD;  Location: [facility] OR;  Service: General;  Laterality: N/A;   SMALL BOWEL REPAIR N/A 10/22/2019   Procedure: Small Bowel Repair;  Surgeon: Diamantina Monks, MD;  Location: [facility] OR;  Service: General;  Laterality: N/A;   Patient Active Problem List   Diagnosis Date Noted   Vaccine counseling 12/24/2022   Insomnia 12/24/2022   Fatigue 12/24/2022   Contusion 10/22/2019   Zoster 12/28/2018   Emphysema of lung (HCC) 01/02/2017   Smoker 01/02/2017   Rash and nonspecific skin eruption 12/07/2015   HIV disease (HCC) 03/06/2010    PCP: None  REFERRING PROVIDER: Dion Saucier D, PA  REFERRING DIAG: neck pain  THERAPY DIAG:  Cervicalgia  Chronic right shoulder pain  Pain in right arm  Muscle weakness (generalized)  Rationale for Evaluation and  Treatment: Rehabilitation  ONSET DATE: Chronic since 2021   SUBJECTIVE SUBJECTIVE STATEMENT: Pt reports continued R sided neck pain.   EVAL: Patient reports in 2021 he had an accident where he had surgery, then when he got home his neck, right shoulder, chest, and upper back hurt. Then about 4 years later (2025) the pain in the right neck, shoulder, and arm started hurting again with no mechanism. He was given a steroid shot and prescription. He did have rotator cuff surgery on the left side and the right shoulder started feeling the same. The doctor told him he may have a pinched nerve in the right side. He also notes some pain in his legs that came on with his neck pain as well. He reports that currently when he has pain it is mainly on the right side from the right neck, shoulder, arm, and even into the leg. He also wakes up hurting so is wondering if how he sleeps affects his pain. Very rarely his right hand will tingle but denies much numbness. When his right shoulder is bothering him he has trouble lifting it. He did have an MRI on his neck yesterday and follows up with his provider on 12/03/2023.  Hand dominance: Right  PERTINENT HISTORY:  See PMH above  PAIN:  Are you having pain? Yes:  NPRS scale: 3/10 currently, 6-8/10 at worst  Pain location: Right neck, shoulder, arm, upper back Pain description: Aching, tired, sharp Aggravating factors: Sitting, turning head to right Relieving factors: Medication  PRECAUTIONS: None  RED FLAGS: None    WEIGHT BEARING RESTRICTIONS: No  FALLS:  Has patient fallen in last 6 months? No  OCCUPATION: Out of work, oversee and operating machines for paint, forklift driving, stacking cans  PLOF: Independent  PATIENT GOALS: Pain relief so he can return to work   OBJECTIVE:  Note: Objective measures were completed at Evaluation unless otherwise noted. PATIENT SURVEYS:  NDI 24/50 (48%)  COGNITION: Overall cognitive status: Within  functional limits for tasks assessed  SENSATION: WFL  POSTURE:   Rounded shoulder and forward head  PALPATION: Tender to palpation right upper trap region, cervical paraspinals and suboccipitals,    CERVICAL ROM:   Active ROM A/PROM (deg) eval  Flexion 55  Extension 40  Right lateral flexion 25  Left lateral flexion 45  Right rotation 50  Left rotation 55   (Blank rows = not tested)  UPPER EXTREMITY ROM:   UE ROM grossly WFL  UPPER EXTREMITY MMT:  MMT Right eval Left eval  Shoulder flexion 4 5  Shoulder extension 5 5  Shoulder abduction 4 5  Shoulder adduction    Shoulder extension    Shoulder internal rotation 5 5  Shoulder external rotation 5 5  Middle trapezius 4- 4-  Lower trapezius 4- 4-  Elbow flexion    Elbow extension    Wrist flexion    Wrist extension    Wrist ulnar deviation    Wrist radial deviation    Wrist pronation    Wrist supination    Grip strength     (Blank rows = not tested)  CERVICAL SPECIAL TESTS:  Spurling's positive on right  FUNCTIONAL TESTS:  DNF endurance: 9 seconds   TREATMENT [facility] Adult PT Treatment:                                                DATE: 12/04/2023 Therapeutic Exercise:  UBE lvl 3 x 4 min (fwd/bwd) for functional endurance Supine chin tuck x10 - 3" hold With lift off - 5'' x10 Foam roller routine for thoracic mobility - including protraction/retraction (unilateral and bilateral), cc/cw circles, horizontal abduction  Manual Therapy:  Lateral glide mid cervical spine UPA mid cervical spine CPA mid cervical spine      PATIENT EDUCATION:  Education details: HEP update Person educated: Patient Education method: Explanation, Demonstration, Tactile cues, Verbal cues, and Handouts Education comprehension: verbalized understanding, returned demonstration, verbal cues required, tactile cues required, and needs further education  HOME EXERCISE PROGRAM: Access Code: [email or web address] URL:  [email or web address] Date: 12/02/2023 Prepared by: Edwinna Areola  Exercises - Seated Cervical Sidebending Stretch  - 2 x daily - 3 reps - 15 seconds hold - Seated Neck Retraction  - 3-5 x daily - 10 reps - 5 seconds hold - Supine Chin Tuck  - 1 x daily - 7 x weekly - 2 sets - 10 reps - 3 sec hold - Supine Shoulder Horizontal Abduction with Resistance  - 1 x daily - 7 x weekly - 3 sets - 10 reps - green band hold - Shoulder External Rotation and Scapular Retraction with Resistance  - 1 x daily - 7 x weekly - 2-3 sets - 10 reps - green band hold -  Standing Shoulder Row with Anchored Resistance  - 1 x daily - 7 x weekly - 3 sets - 10 reps - blue band hold   ASSESSMENT: CLINICAL IMPRESSION: Jullien tolerated session well with no adverse reaction.  Concentrated on DNF strengthening combined with manual joint mobilization.  Pt with fatigue noted with chin tuck with lift but no significant increase in sxs.  Pt with reduction in sxs following manual.  EVAL: Patient is a 43 y.o. male who was seen today for physical therapy evaluation and treatment primarily for chronic right sided neck, shoulder, and arm pain. He also notes occasional back, chest, and LE pain. Currently his symptoms do seem consistent with right cervical radiculopathic pain. He exhibits limitations with his cervical motion and with increased muscular tension on right side. Postural deviations with DNF endurance deficit and periscapular strength impairment.   OBJECTIVE IMPAIRMENTS: decreased activity tolerance, decreased ROM, decreased strength, postural dysfunction, and pain.   ACTIVITY LIMITATIONS: lifting, sitting, and sleeping  PARTICIPATION LIMITATIONS: occupation  PERSONAL FACTORS: Past/current experiences and Time since onset of injury/illness/exacerbation are also affecting patient's functional outcome.   REHAB POTENTIAL: Good  CLINICAL DECISION MAKING: Stable/uncomplicated  EVALUATION COMPLEXITY:  Low   GOALS: Goals reviewed with patient? Yes  SHORT TERM GOALS: Target date: 12/23/2023  Patient will be I with initial HEP in order to progress with therapy. Baseline: HEP provided at eval Goal status: INITIAL  2.  Patient will demonstrate improvement in cervical rotation >/= 10 deg bilaterally in order to improve mobility and reduce neck pain Baseline: see limitations above Goal status: INITIAL  LONG TERM GOALS: Target date: 01/20/2024  Patient will be I with final HEP to maintain progress from PT. Baseline: HEP provided at eval Goal status: INITIAL  2.  Patient will report </= 24% disability on the NDI in order to indicate improvement in functional status Baseline: 48% Goal status: INITIAL  3.  Patient will demonstrate DNF endurance >/= 30 seconds to improve postural control and reduce neck pain Baseline: 9 seconds Goal status: INITIAL  4.  Patient will report neck and shoulder/arm pain </= 4/10 with all activity in order to reduce functional limitations and return to work Baseline: 6-8/10 Goal status: INITIAL   PLAN: PT FREQUENCY: 1-2x/week  PT DURATION: 8 weeks  PLANNED INTERVENTIONS: 97164- PT Re-evaluation, 97110-Therapeutic exercises, 97530- Therapeutic activity, 97112- Neuromuscular re-education, 97535- Self Care, 91478- Manual therapy, [id number]- Electrical stimulation (unattended), [id number]- Electrical stimulation (manual), 97012- Traction (mechanical), Patient/Family education, Taping, Dry Needling, Joint mobilization, Joint manipulation, Spinal manipulation, Spinal mobilization, Cryotherapy, and Moist heat  PLAN FOR NEXT SESSION: Review HEP and progress PRN, manual/TPDN for right shoulder and neck region, cervical mobs to improve motion and reduce pain, assess neurodynamics, progress postural exercises  Kimberlee Nearing Kriston Mckinnie PT  12/04/23 8:31 AM

## 2023-12-09 ENCOUNTER — Ambulatory Visit: Attending: Physician Assistant

## 2023-12-09 DIAGNOSIS — M542 Cervicalgia: Secondary | ICD-10-CM | POA: Insufficient documentation

## 2023-12-09 DIAGNOSIS — G8929 Other chronic pain: Secondary | ICD-10-CM | POA: Insufficient documentation

## 2023-12-09 DIAGNOSIS — M79601 Pain in right arm: Secondary | ICD-10-CM | POA: Diagnosis present

## 2023-12-09 DIAGNOSIS — M25511 Pain in right shoulder: Secondary | ICD-10-CM | POA: Insufficient documentation

## 2023-12-09 DIAGNOSIS — M6281 Muscle weakness (generalized): Secondary | ICD-10-CM | POA: Insufficient documentation

## 2023-12-09 NOTE — Therapy (Signed)
 OUTPATIENT PHYSICAL THERAPY TREATMENT   Patient Name: Jeremy Harper MRN: [id number] DOB:[birth year], 43 y.o., male Today's Date: 12/09/2023   END OF SESSION:  PT End of Session - 12/09/23 0853     Visit Number 4    Number of Visits 17    Date for PT Re-Evaluation 01/20/24    Authorization Type [employer]    PT Start Time 0853    PT Stop Time 0931    PT Time Calculation (min) 38 min    Activity Tolerance Patient tolerated treatment well    Behavior During Therapy [facility] for tasks assessed/performed               Past Medical History:  Diagnosis Date   AIDS (HCC) 12/07/2015   Emphysema of lung (HCC) 01/02/2017   Fatigue 12/24/2022   Insomnia 12/24/2022   Rash and nonspecific skin eruption 12/07/2015   Shoulder injury 2025   Smoker 01/02/2017   Thrush 12/07/2015   Vaccine counseling 12/24/2022   Zoster 12/28/2018   Past Surgical History:  Procedure Laterality Date   LAPAROSCOPY N/A 10/22/2019   Procedure: LAPAROSCOPY DIAGNOSTIC;  Surgeon: Diamantina Monks, MD;  Location: [facility] OR;  Service: General;  Laterality: N/A;   LAPAROTOMY N/A 10/22/2019   Procedure: EXPLORATORY LAPAROTOMY;  Surgeon: Diamantina Monks, MD;  Location: [facility] OR;  Service: General;  Laterality: N/A;   SMALL BOWEL REPAIR N/A 10/22/2019   Procedure: Small Bowel Repair;  Surgeon: Diamantina Monks, MD;  Location: [facility] OR;  Service: General;  Laterality: N/A;   Patient Active Problem List   Diagnosis Date Noted   Vaccine counseling 12/24/2022   Insomnia 12/24/2022   Fatigue 12/24/2022   Contusion 10/22/2019   Zoster 12/28/2018   Emphysema of lung (HCC) 01/02/2017   Smoker 01/02/2017   Rash and nonspecific skin eruption 12/07/2015   HIV disease (HCC) 03/06/2010    PCP: None  REFERRING PROVIDER: Dion Saucier D, PA  REFERRING DIAG: neck pain  THERAPY DIAG:  Cervicalgia  Chronic right shoulder pain  Pain in right arm  Muscle weakness (generalized)  Rationale for Evaluation and Treatment:  Rehabilitation  ONSET DATE: Chronic since 2021   SUBJECTIVE SUBJECTIVE STATEMENT: Pt presents to PT with reports of continued pain after working around the house over the weekend. Has been compliant with HEP.   EVAL: Patient reports in 2021 he had an accident where he had surgery, then when he got home his neck, right shoulder, chest, and upper back hurt. Then about 4 years later (2025) the pain in the right neck, shoulder, and arm started hurting again with no mechanism. He was given a steroid shot and prescription. He did have rotator cuff surgery on the left side and the right shoulder started feeling the same. The doctor told him he may have a pinched nerve in the right side. He also notes some pain in his legs that came on with his neck pain as well. He reports that currently when he has pain it is mainly on the right side from the right neck, shoulder, arm, and even into the leg. He also wakes up hurting so is wondering if how he sleeps affects his pain. Very rarely his right hand will tingle but denies much numbness. When his right shoulder is bothering him he has trouble lifting it. He did have an MRI on his neck yesterday and follows up with his provider on 12/03/2023.  Hand dominance: Right  PERTINENT HISTORY:  See PMH above  PAIN:  Are  you having pain? Yes:  NPRS scale: 3/10 currently, 6-8/10 at worst Pain location: Right neck, shoulder, arm, upper back Pain description: Aching, tired, sharp Aggravating factors: Sitting, turning head to right Relieving factors: Medication  PRECAUTIONS: None  RED FLAGS: None    WEIGHT BEARING RESTRICTIONS: No  FALLS:  Has patient fallen in last 6 months? No  OCCUPATION: Out of work, oversee and operating machines for paint, forklift driving, stacking cans  PLOF: Independent  PATIENT GOALS: Pain relief so he can return to work   OBJECTIVE:  Note: Objective measures were completed at Evaluation unless otherwise noted. PATIENT  SURVEYS:  NDI 24/50 (48%)  COGNITION: Overall cognitive status: Within functional limits for tasks assessed  SENSATION: WFL  POSTURE:   Rounded shoulder and forward head  PALPATION: Tender to palpation right upper trap region, cervical paraspinals and suboccipitals,    CERVICAL ROM:   Active ROM A/PROM (deg) eval  Flexion 55  Extension 40  Right lateral flexion 25  Left lateral flexion 45  Right rotation 50  Left rotation 55   (Blank rows = not tested)  UPPER EXTREMITY ROM:   UE ROM grossly WFL  UPPER EXTREMITY MMT:  MMT Right eval Left eval  Shoulder flexion 4 5  Shoulder extension 5 5  Shoulder abduction 4 5  Shoulder adduction    Shoulder extension    Shoulder internal rotation 5 5  Shoulder external rotation 5 5  Middle trapezius 4- 4-  Lower trapezius 4- 4-  Elbow flexion    Elbow extension    Wrist flexion    Wrist extension    Wrist ulnar deviation    Wrist radial deviation    Wrist pronation    Wrist supination    Grip strength     (Blank rows = not tested)  CERVICAL SPECIAL TESTS:  Spurling's positive on right  FUNCTIONAL TESTS:  DNF endurance: 9 seconds   TREATMENT [facility] Adult PT Treatment:                                                DATE: 12/09/2023 Therapeutic Exercise:  UBE lvl 1.5 x 3 min (fwd/bwd) for functional endurance Supine chin tuck x10 - 5" hold Supine horizontal abd 2x15 GTB Seated bilateral ER 3x10 GTB Standing chin tuck with ball 2x10 - 3" hold Corner pec stretch 2x30" Seated low row 2x10 35# Cat-Cow x 10 S/L open book x 10 each  PATIENT EDUCATION:  Education details: HEP update Person educated: Patient Education method: Explanation, Demonstration, Tactile cues, Verbal cues, and Handouts Education comprehension: verbalized understanding, returned demonstration, verbal cues required, tactile cues required, and needs further education  HOME EXERCISE PROGRAM: Access Code: [email or web address] URL:  [email or web address] Date: 12/09/2023 Prepared by: Edwinna Areola  Exercises - Seated Cervical Sidebending Stretch  - 2 x daily - 3 reps - 15 seconds hold - Seated Neck Retraction  - 3-5 x daily - 10 reps - 5 seconds hold - Supine Chin Tuck  - 1 x daily - 7 x weekly - 2 sets - 10 reps - 3 sec hold - Supine Shoulder Horizontal Abduction with Resistance  - 1 x daily - 7 x weekly - 3 sets - 10 reps - green band hold - Shoulder External Rotation and Scapular Retraction with Resistance  - 1 x daily - 7 x weekly - 2-3 sets - 10  reps - green band hold - Standing Shoulder Row with Anchored Resistance  - 1 x daily - 7 x weekly - 3 sets - 10 reps - blue band hold - Supine Deep Neck Flexor Training - Hold  - 1 x daily - 7 x weekly - 1 sets - 10 reps - 5 second hold - Sidelying Open Book Thoracic Lumbar Rotation and Extension  - 1 x daily - 7 x weekly - 10 reps   ASSESSMENT: CLINICAL IMPRESSION: Pt was able to complete prescribed exercises with no adverse effect. Exercises today focused on improving DNF and periscapular strength as well as thoracic mobility in order to decrease neck pain. HEP updated for thoracic mobility to keep working on decreasing neck pain. Progressing with therapy, consider TPDN once again at next session.   EVAL: Patient is a 43 y.o. male who was seen today for physical therapy evaluation and treatment primarily for chronic right sided neck, shoulder, and arm pain. He also notes occasional back, chest, and LE pain. Currently his symptoms do seem consistent with right cervical radiculopathic pain. He exhibits limitations with his cervical motion and with increased muscular tension on right side. Postural deviations with DNF endurance deficit and periscapular strength impairment.   OBJECTIVE IMPAIRMENTS: decreased activity tolerance, decreased ROM, decreased strength, postural dysfunction, and pain.   ACTIVITY LIMITATIONS: lifting, sitting, and sleeping  PARTICIPATION  LIMITATIONS: occupation  PERSONAL FACTORS: Past/current experiences and Time since onset of injury/illness/exacerbation are also affecting patient's functional outcome.   REHAB POTENTIAL: Good  CLINICAL DECISION MAKING: Stable/uncomplicated  EVALUATION COMPLEXITY: Low   GOALS: Goals reviewed with patient? Yes  SHORT TERM GOALS: Target date: 12/23/2023  Patient will be I with initial HEP in order to progress with therapy. Baseline: HEP provided at eval Goal status: MET  2.  Patient will demonstrate improvement in cervical rotation >/= 10 deg bilaterally in order to improve mobility and reduce neck pain Baseline: see limitations above Goal status: INITIAL  LONG TERM GOALS: Target date: 01/20/2024  Patient will be I with final HEP to maintain progress from PT. Baseline: HEP provided at eval Goal status: INITIAL  2.  Patient will report </= 24% disability on the NDI in order to indicate improvement in functional status Baseline: 48% Goal status: INITIAL  3.  Patient will demonstrate DNF endurance >/= 30 seconds to improve postural control and reduce neck pain Baseline: 9 seconds Goal status: INITIAL  4.  Patient will report neck and shoulder/arm pain </= 4/10 with all activity in order to reduce functional limitations and return to work Baseline: 6-8/10 Goal status: INITIAL   PLAN: PT FREQUENCY: 1-2x/week  PT DURATION: 8 weeks  PLANNED INTERVENTIONS: 97164- PT Re-evaluation, 97110-Therapeutic exercises, 97530- Therapeutic activity, 97112- Neuromuscular re-education, 97535- Self Care, 16109- Manual therapy, [id number]- Electrical stimulation (unattended), [phone]- Electrical stimulation (manual), [id number]- Traction (mechanical), Patient/Family education, Taping, Dry Needling, Joint mobilization, Joint manipulation, Spinal manipulation, Spinal mobilization, Cryotherapy, and Moist heat  PLAN FOR NEXT SESSION: Review HEP and progress PRN, manual/TPDN for right shoulder and neck region,  cervical mobs to improve motion and reduce pain, assess neurodynamics, progress postural exercises  Eloy End PT  12/09/23 9:31 AM

## 2023-12-11 ENCOUNTER — Ambulatory Visit: Admitting: Physical Therapy

## 2023-12-11 ENCOUNTER — Encounter: Payer: Self-pay | Admitting: Physical Therapy

## 2023-12-11 DIAGNOSIS — M542 Cervicalgia: Secondary | ICD-10-CM

## 2023-12-11 DIAGNOSIS — M6281 Muscle weakness (generalized): Secondary | ICD-10-CM

## 2023-12-11 DIAGNOSIS — M79601 Pain in right arm: Secondary | ICD-10-CM

## 2023-12-11 DIAGNOSIS — G8929 Other chronic pain: Secondary | ICD-10-CM

## 2023-12-11 NOTE — Therapy (Signed)
 OUTPATIENT PHYSICAL THERAPY TREATMENT   Patient Name: Jeremy Harper MRN: [id number] DOB:Apr 12, 1981, 43 y.o., male Today's Date: 12/11/2023   END OF SESSION:  PT End of Session - 12/11/23 0753     Visit Number 5    Number of Visits 17    Date for PT Re-Evaluation 01/20/24    Authorization Type [employer]    PT Start Time [phone]   pt arrived late   PT Stop Time 0830    PT Time Calculation (min) 38 min    Activity Tolerance Patient tolerated treatment well    Behavior During Therapy [facility] for tasks assessed/performed               Past Medical History:  Diagnosis Date   AIDS (HCC) 12/07/2015   Emphysema of lung (HCC) 01/02/2017   Fatigue 12/24/2022   Insomnia 12/24/2022   Rash and nonspecific skin eruption 12/07/2015   Shoulder injury 2025   Smoker 01/02/2017   Thrush 12/07/2015   Vaccine counseling 12/24/2022   Zoster 12/28/2018   Past Surgical History:  Procedure Laterality Date   LAPAROSCOPY N/A 10/22/2019   Procedure: LAPAROSCOPY DIAGNOSTIC;  Surgeon: Diamantina Monks, MD;  Location: [facility] OR;  Service: General;  Laterality: N/A;   LAPAROTOMY N/A 10/22/2019   Procedure: EXPLORATORY LAPAROTOMY;  Surgeon: Diamantina Monks, MD;  Location: [facility] OR;  Service: General;  Laterality: N/A;   SMALL BOWEL REPAIR N/A 10/22/2019   Procedure: Small Bowel Repair;  Surgeon: Diamantina Monks, MD;  Location: [facility] OR;  Service: General;  Laterality: N/A;   Patient Active Problem List   Diagnosis Date Noted   Vaccine counseling 12/24/2022   Insomnia 12/24/2022   Fatigue 12/24/2022   Contusion 10/22/2019   Zoster 12/28/2018   Emphysema of lung (HCC) 01/02/2017   Smoker 01/02/2017   Rash and nonspecific skin eruption 12/07/2015   HIV disease (HCC) 03/06/2010    PCP: None  REFERRING PROVIDER: Dion Saucier D, PA  REFERRING DIAG: neck pain  THERAPY DIAG:  Cervicalgia  Chronic right shoulder pain  Pain in right arm  Muscle weakness (generalized)  Rationale for Evaluation and  Treatment: Rehabilitation  ONSET DATE: Chronic since 2021   SUBJECTIVE SUBJECTIVE STATEMENT: Pt reports that he still has frequent pain but the pain intensity is a bit lower.  EVAL: Patient reports in 2021 he had an accident where he had surgery, then when he got home his neck, right shoulder, chest, and upper back hurt. Then about 4 years later (2025) the pain in the right neck, shoulder, and arm started hurting again with no mechanism. He was given a steroid shot and prescription. He did have rotator cuff surgery on the left side and the right shoulder started feeling the same. The doctor told him he may have a pinched nerve in the right side. He also notes some pain in his legs that came on with his neck pain as well. He reports that currently when he has pain it is mainly on the right side from the right neck, shoulder, arm, and even into the leg. He also wakes up hurting so is wondering if how he sleeps affects his pain. Very rarely his right hand will tingle but denies much numbness. When his right shoulder is bothering him he has trouble lifting it. He did have an MRI on his neck yesterday and follows up with his provider on 12/03/2023.  Hand dominance: Right  PERTINENT HISTORY:  See PMH above  PAIN:  Are you having pain?  Yes:  NPRS scale: 3/10 currently, 6-8/10 at worst Pain location: Right neck, shoulder, arm, upper back Pain description: Aching, tired, sharp Aggravating factors: Sitting, turning head to right Relieving factors: Medication  PRECAUTIONS: None  RED FLAGS: None    WEIGHT BEARING RESTRICTIONS: No  FALLS:  Has patient fallen in last 6 months? No  OCCUPATION: Out of work, oversee and operating machines for paint, forklift driving, stacking cans  PLOF: Independent  PATIENT GOALS: Pain relief so he can return to work   OBJECTIVE:  Note: Objective measures were completed at Evaluation unless otherwise noted. PATIENT SURVEYS:  NDI 24/50  (48%)  COGNITION: Overall cognitive status: Within functional limits for tasks assessed  SENSATION: WFL  POSTURE:   Rounded shoulder and forward head  PALPATION: Tender to palpation right upper trap region, cervical paraspinals and suboccipitals,    CERVICAL ROM:   Active ROM A/PROM (deg) eval  Flexion 55  Extension 40  Right lateral flexion 25  Left lateral flexion 45  Right rotation 50  Left rotation 55   (Blank rows = not tested)  UPPER EXTREMITY ROM:   UE ROM grossly WFL  UPPER EXTREMITY MMT:  MMT Right eval Left eval  Shoulder flexion 4 5  Shoulder extension 5 5  Shoulder abduction 4 5  Shoulder adduction    Shoulder extension    Shoulder internal rotation 5 5  Shoulder external rotation 5 5  Middle trapezius 4- 4-  Lower trapezius 4- 4-  Elbow flexion    Elbow extension    Wrist flexion    Wrist extension    Wrist ulnar deviation    Wrist radial deviation    Wrist pronation    Wrist supination    Grip strength     (Blank rows = not tested)  CERVICAL SPECIAL TESTS:  Spurling's positive on right  FUNCTIONAL TESTS:  DNF endurance: 9 seconds   TREATMENT [facility] Adult PT Treatment:                                                DATE: 12/11/2023 Therapeutic Exercise:  UBE lvl 1.5 x 3 min (fwd/bwd) for functional endurance Supine chin tuck x10 - 5" hold With lift 3x to fatigue ~20'' Resisted cervical ext against gravity with head hanging over head of mat table - 2x to fatigue - caused abdominal pain/cramping  Manual Therapy  CPA and UPA mid to lower lumbar spine L>R   PATIENT EDUCATION:  Education details: HEP update Person educated: Patient Education method: Explanation, Demonstration, Tactile cues, Verbal cues, and Handouts Education comprehension: verbalized understanding, returned demonstration, verbal cues required, tactile cues required, and needs further education  HOME EXERCISE PROGRAM: Access Code: [email or web address] URL:  [email or web address] Date: 12/09/2023 Prepared by: Edwinna Areola  Exercises - Seated Cervical Sidebending Stretch  - 2 x daily - 3 reps - 15 seconds hold - Seated Neck Retraction  - 3-5 x daily - 10 reps - 5 seconds hold - Supine Chin Tuck  - 1 x daily - 7 x weekly - 2 sets - 10 reps - 3 sec hold - Supine Shoulder Horizontal Abduction with Resistance  - 1 x daily - 7 x weekly - 3 sets - 10 reps - green band hold - Shoulder External Rotation and Scapular Retraction with Resistance  - 1 x daily - 7 x weekly - 2-3 sets -  10 reps - green band hold - Standing Shoulder Row with Anchored Resistance  - 1 x daily - 7 x weekly - 3 sets - 10 reps - blue band hold - Supine Deep Neck Flexor Training - Hold  - 1 x daily - 7 x weekly - 1 sets - 10 reps - 5 second hold - Sidelying Open Book Thoracic Lumbar Rotation and Extension  - 1 x daily - 7 x weekly - 10 reps   ASSESSMENT: CLINICAL IMPRESSION: Pt was able to complete prescribed exercises with no adverse effect. Exercise focus on neck flexor and extensor endurance.  He does show an improvement in neck flexor endurance to ~ 20 sec today. Trialed CPA and UPA of mid and lower Cx spine with noted hypomobility L>R.  EVAL: Patient is a 43 y.o. male who was seen today for physical therapy evaluation and treatment primarily for chronic right sided neck, shoulder, and arm pain. He also notes occasional back, chest, and LE pain. Currently his symptoms do seem consistent with right cervical radiculopathic pain. He exhibits limitations with his cervical motion and with increased muscular tension on right side. Postural deviations with DNF endurance deficit and periscapular strength impairment.   OBJECTIVE IMPAIRMENTS: decreased activity tolerance, decreased ROM, decreased strength, postural dysfunction, and pain.   ACTIVITY LIMITATIONS: lifting, sitting, and sleeping  PARTICIPATION LIMITATIONS: occupation  PERSONAL FACTORS: Past/current experiences  and Time since onset of injury/illness/exacerbation are also affecting patient's functional outcome.   REHAB POTENTIAL: Good  CLINICAL DECISION MAKING: Stable/uncomplicated  EVALUATION COMPLEXITY: Low   GOALS: Goals reviewed with patient? Yes  SHORT TERM GOALS: Target date: 12/23/2023  Patient will be I with initial HEP in order to progress with therapy. Baseline: HEP provided at eval Goal status: MET  2.  Patient will demonstrate improvement in cervical rotation >/= 10 deg bilaterally in order to improve mobility and reduce neck pain Baseline: see limitations above Goal status: INITIAL  LONG TERM GOALS: Target date: 01/20/2024  Patient will be I with final HEP to maintain progress from PT. Baseline: HEP provided at eval Goal status: INITIAL  2.  Patient will report </= 24% disability on the NDI in order to indicate improvement in functional status Baseline: 48% Goal status: INITIAL  3.  Patient will demonstrate DNF endurance >/= 30 seconds to improve postural control and reduce neck pain Baseline: 9 seconds Goal status: INITIAL  4.  Patient will report neck and shoulder/arm pain </= 4/10 with all activity in order to reduce functional limitations and return to work Baseline: 6-8/10 Goal status: INITIAL   PLAN: PT FREQUENCY: 1-2x/week  PT DURATION: 8 weeks  PLANNED INTERVENTIONS: 97164- PT Re-evaluation, 97110-Therapeutic exercises, 97530- Therapeutic activity, 97112- Neuromuscular re-education, 97535- Self Care, 66440- Manual therapy, [id number]- Electrical stimulation (unattended), [phone]- Electrical stimulation (manual), 97012- Traction (mechanical), Patient/Family education, Taping, Dry Needling, Joint mobilization, Joint manipulation, Spinal manipulation, Spinal mobilization, Cryotherapy, and Moist heat  PLAN FOR NEXT SESSION: Review HEP and progress PRN, manual/TPDN for right shoulder and neck region, cervical mobs to improve motion and reduce pain, assess neurodynamics,  progress postural exercises  Kimberlee Nearing Domenick Quebedeaux PT  12/11/23 9:42 AM

## 2023-12-14 DIAGNOSIS — M5412 Radiculopathy, cervical region: Secondary | ICD-10-CM | POA: Insufficient documentation

## 2023-12-16 ENCOUNTER — Other Ambulatory Visit: Payer: Self-pay

## 2023-12-16 ENCOUNTER — Encounter: Payer: Self-pay | Admitting: Physical Therapy

## 2023-12-16 ENCOUNTER — Ambulatory Visit: Admitting: Physical Therapy

## 2023-12-16 DIAGNOSIS — B20 Human immunodeficiency virus [HIV] disease: Secondary | ICD-10-CM

## 2023-12-16 DIAGNOSIS — M79601 Pain in right arm: Secondary | ICD-10-CM

## 2023-12-16 DIAGNOSIS — M542 Cervicalgia: Secondary | ICD-10-CM | POA: Diagnosis not present

## 2023-12-16 DIAGNOSIS — Z79899 Other long term (current) drug therapy: Secondary | ICD-10-CM

## 2023-12-16 DIAGNOSIS — Z113 Encounter for screening for infections with a predominantly sexual mode of transmission: Secondary | ICD-10-CM

## 2023-12-16 DIAGNOSIS — G8929 Other chronic pain: Secondary | ICD-10-CM

## 2023-12-16 DIAGNOSIS — M6281 Muscle weakness (generalized): Secondary | ICD-10-CM

## 2023-12-16 NOTE — Therapy (Signed)
 OUTPATIENT PHYSICAL THERAPY TREATMENT   Patient Name: Jeremy Harper MRN: [id number] DOB:14-Dec-1980, 43 y.o., male Today's Date: 12/16/2023   END OF SESSION:  PT End of Session - 12/16/23 0806     Visit Number 6    Number of Visits 17    Date for PT Re-Evaluation 01/20/24    Authorization Type [employer]    PT Start Time [phone]   pt arrived late   PT Stop Time 0844    PT Time Calculation (min) 38 min    Activity Tolerance Patient tolerated treatment well    Behavior During Therapy [facility] for tasks assessed/performed               Past Medical History:  Diagnosis Date   AIDS (HCC) 12/07/2015   Emphysema of lung (HCC) 01/02/2017   Fatigue 12/24/2022   Insomnia 12/24/2022   Rash and nonspecific skin eruption 12/07/2015   Shoulder injury 2025   Smoker 01/02/2017   Thrush 12/07/2015   Vaccine counseling 12/24/2022   Zoster 12/28/2018   Past Surgical History:  Procedure Laterality Date   LAPAROSCOPY N/A 10/22/2019   Procedure: LAPAROSCOPY DIAGNOSTIC;  Surgeon: Diamantina Monks, MD;  Location: [facility] OR;  Service: General;  Laterality: N/A;   LAPAROTOMY N/A 10/22/2019   Procedure: EXPLORATORY LAPAROTOMY;  Surgeon: Diamantina Monks, MD;  Location: [facility] OR;  Service: General;  Laterality: N/A;   SMALL BOWEL REPAIR N/A 10/22/2019   Procedure: Small Bowel Repair;  Surgeon: Diamantina Monks, MD;  Location: [facility] OR;  Service: General;  Laterality: N/A;   Patient Active Problem List   Diagnosis Date Noted   Vaccine counseling 12/24/2022   Insomnia 12/24/2022   Fatigue 12/24/2022   Contusion 10/22/2019   Zoster 12/28/2018   Emphysema of lung (HCC) 01/02/2017   Smoker 01/02/2017   Rash and nonspecific skin eruption 12/07/2015   HIV disease (HCC) 03/06/2010    PCP: None  REFERRING PROVIDER: Dion Saucier D, PA  REFERRING DIAG: neck pain  THERAPY DIAG:  Cervicalgia  Chronic right shoulder pain  Pain in right arm  Muscle weakness (generalized)  Rationale for Evaluation and  Treatment: Rehabilitation  ONSET DATE: Chronic since 2021   SUBJECTIVE SUBJECTIVE STATEMENT: Pt feels some improvement in his neck pain.  He still has bouts of pain but this is less frequent.   EVAL: Patient reports in 2021 he had an accident where he had surgery, then when he got home his neck, right shoulder, chest, and upper back hurt. Then about 4 years later (2025) the pain in the right neck, shoulder, and arm started hurting again with no mechanism. He was given a steroid shot and prescription. He did have rotator cuff surgery on the left side and the right shoulder started feeling the same. The doctor told him he may have a pinched nerve in the right side. He also notes some pain in his legs that came on with his neck pain as well. He reports that currently when he has pain it is mainly on the right side from the right neck, shoulder, arm, and even into the leg. He also wakes up hurting so is wondering if how he sleeps affects his pain. Very rarely his right hand will tingle but denies much numbness. When his right shoulder is bothering him he has trouble lifting it. He did have an MRI on his neck yesterday and follows up with his provider on 12/03/2023.  Hand dominance: Right  PERTINENT HISTORY:  See PMH above  PAIN:  Are you having pain? Yes:  NPRS scale: 3/10 currently, 6-8/10 at worst Pain location: Right neck, shoulder, arm, upper back Pain description: Aching, tired, sharp Aggravating factors: Sitting, turning head to right Relieving factors: Medication  PRECAUTIONS: None  RED FLAGS: None    WEIGHT BEARING RESTRICTIONS: No  FALLS:  Has patient fallen in last 6 months? No  OCCUPATION: Out of work, oversee and operating machines for paint, forklift driving, stacking cans  PLOF: Independent  PATIENT GOALS: Pain relief so he can return to work   OBJECTIVE:  Note: Objective measures were completed at Evaluation unless otherwise noted. PATIENT SURVEYS:  NDI 24/50  (48%)  COGNITION: Overall cognitive status: Within functional limits for tasks assessed  SENSATION: WFL  POSTURE:   Rounded shoulder and forward head  PALPATION: Tender to palpation right upper trap region, cervical paraspinals and suboccipitals,    CERVICAL ROM:   Active ROM A/PROM (deg) eval 4/8   Flexion 55   Extension 40   Right lateral flexion 25   Left lateral flexion 45   Right rotation 50 70  Left rotation 55 70   (Blank rows = not tested)  UPPER EXTREMITY ROM:   UE ROM grossly WFL  UPPER EXTREMITY MMT:  MMT Right eval Left eval  Shoulder flexion 4 5  Shoulder extension 5 5  Shoulder abduction 4 5  Shoulder adduction    Shoulder extension    Shoulder internal rotation 5 5  Shoulder external rotation 5 5  Middle trapezius 4- 4-  Lower trapezius 4- 4-  Elbow flexion    Elbow extension    Wrist flexion    Wrist extension    Wrist ulnar deviation    Wrist radial deviation    Wrist pronation    Wrist supination    Grip strength     (Blank rows = not tested)  CERVICAL SPECIAL TESTS:  Spurling's positive on right  FUNCTIONAL TESTS:  DNF endurance: 9 seconds   TREATMENT [facility] Adult PT Treatment:                                                DATE: 12/16/2023 Therapeutic Exercise:  UBE lvl 1.5 x 3 min (fwd/bwd) for functional endurance Supine chin tuck x10 - 5" hold With lift 3x to fatigue ~25'' Chin tuck at wall YTB bil ER Chin tuck at wall YTB pull apart  Consider prone periscapular and cervial ext next visit  Manual Therapy  CPA and UPA mid to lower lumbar spine L>R Manual cervical retraction   PATIENT EDUCATION:  Education details: HEP update Person educated: Patient Education method: Explanation, Demonstration, Tactile cues, Verbal cues, and Handouts Education comprehension: verbalized understanding, returned demonstration, verbal cues required, tactile cues required, and needs further education  HOME EXERCISE PROGRAM: Access  Code: [email or web address] URL: [email or web address] Date: 12/09/2023 Prepared by: Edwinna Areola  Exercises - Seated Cervical Sidebending Stretch  - 2 x daily - 3 reps - 15 seconds hold - Seated Neck Retraction  - 3-5 x daily - 10 reps - 5 seconds hold - Supine Chin Tuck  - 1 x daily - 7 x weekly - 2 sets - 10 reps - 3 sec hold - Supine Shoulder Horizontal Abduction with Resistance  - 1 x daily - 7 x weekly - 3 sets - 10 reps - green band hold - Shoulder External Rotation and  Scapular Retraction with Resistance  - 1 x daily - 7 x weekly - 2-3 sets - 10 reps - green band hold - Standing Shoulder Row with Anchored Resistance  - 1 x daily - 7 x weekly - 3 sets - 10 reps - blue band hold - Supine Deep Neck Flexor Training - Hold  - 1 x daily - 7 x weekly - 1 sets - 10 reps - 5 second hold - Sidelying Open Book Thoracic Lumbar Rotation and Extension  - 1 x daily - 7 x weekly - 10 reps   ASSESSMENT: CLINICAL IMPRESSION: Pt was able to complete prescribed exercises with no adverse effect. Pt reports continued variable sxs with increase in his shoulders feeling weak and heavy over the last week.  Pt with significant improvement in cervical ROM compared to eval today.  Discussed continuing for about 2 more weeks as pt has seen some improvement.  EVAL: Patient is a 43 y.o. male who was seen today for physical therapy evaluation and treatment primarily for chronic right sided neck, shoulder, and arm pain. He also notes occasional back, chest, and LE pain. Currently his symptoms do seem consistent with right cervical radiculopathic pain. He exhibits limitations with his cervical motion and with increased muscular tension on right side. Postural deviations with DNF endurance deficit and periscapular strength impairment.   OBJECTIVE IMPAIRMENTS: decreased activity tolerance, decreased ROM, decreased strength, postural dysfunction, and pain.   ACTIVITY LIMITATIONS: lifting, sitting, and  sleeping  PARTICIPATION LIMITATIONS: occupation  PERSONAL FACTORS: Past/current experiences and Time since onset of injury/illness/exacerbation are also affecting patient's functional outcome.   REHAB POTENTIAL: Good  CLINICAL DECISION MAKING: Stable/uncomplicated  EVALUATION COMPLEXITY: Low   GOALS: Goals reviewed with patient? Yes  SHORT TERM GOALS: Target date: 12/23/2023  Patient will be I with initial HEP in order to progress with therapy. Baseline: HEP provided at eval Goal status: MET  2.  Patient will demonstrate improvement in cervical rotation >/= 10 deg bilaterally in order to improve mobility and reduce neck pain Baseline: see limitations above Goal status: INITIAL  LONG TERM GOALS: Target date: 01/20/2024  Patient will be I with final HEP to maintain progress from PT. Baseline: HEP provided at eval Goal status: INITIAL  2.  Patient will report </= 24% disability on the NDI in order to indicate improvement in functional status Baseline: 48% Goal status: INITIAL  3.  Patient will demonstrate DNF endurance >/= 30 seconds to improve postural control and reduce neck pain Baseline: 9 seconds Goal status: INITIAL  4.  Patient will report neck and shoulder/arm pain </= 4/10 with all activity in order to reduce functional limitations and return to work Baseline: 6-8/10 Goal status: INITIAL   PLAN: PT FREQUENCY: 1-2x/week  PT DURATION: 8 weeks  PLANNED INTERVENTIONS: 97164- PT Re-evaluation, 97110-Therapeutic exercises, 97530- Therapeutic activity, 97112- Neuromuscular re-education, 97535- Self Care, [id number]- Manual therapy, G0283- Electrical stimulation (unattended), [phone]- Electrical stimulation (manual), 97012- Traction (mechanical), Patient/Family education, Taping, Dry Needling, Joint mobilization, Joint manipulation, Spinal manipulation, Spinal mobilization, Cryotherapy, and Moist heat  PLAN FOR NEXT SESSION: Review HEP and progress PRN, manual/TPDN for right  shoulder and neck region, cervical mobs to improve motion and reduce pain, assess neurodynamics, progress postural exercises  Kimberlee Nearing Zareen Jamison PT  12/16/23 10:24 AM

## 2023-12-18 ENCOUNTER — Ambulatory Visit

## 2023-12-18 DIAGNOSIS — M542 Cervicalgia: Secondary | ICD-10-CM

## 2023-12-18 DIAGNOSIS — G8929 Other chronic pain: Secondary | ICD-10-CM

## 2023-12-18 DIAGNOSIS — M79601 Pain in right arm: Secondary | ICD-10-CM

## 2023-12-18 NOTE — Therapy (Signed)
 OUTPATIENT PHYSICAL THERAPY TREATMENT   Patient Name: Jeremy Harper MRN: [id number] DOB:August 11, 1981, 43 y.o., male Today's Date: 12/18/2023   END OF SESSION:  PT End of Session - 12/18/23 0846     Visit Number 7    Number of Visits 17    Date for PT Re-Evaluation 01/20/24    Authorization Type U[employer]    PT Start Time [phone]    Activity Tolerance Patient tolerated treatment well    Behavior During Therapy [facility] for tasks assessed/performed                Past Medical History:  Diagnosis Date   AIDS (HCC) 12/07/2015   Emphysema of lung (HCC) 01/02/2017   Fatigue 12/24/2022   Insomnia 12/24/2022   Rash and nonspecific skin eruption 12/07/2015   Shoulder injury 2025   Smoker 01/02/2017   Thrush 12/07/2015   Vaccine counseling 12/24/2022   Zoster 12/28/2018   Past Surgical History:  Procedure Laterality Date   LAPAROSCOPY N/A 10/22/2019   Procedure: LAPAROSCOPY DIAGNOSTIC;  Surgeon: Diamantina Monks, MD;  Location: [facility] OR;  Service: General;  Laterality: N/A;   LAPAROTOMY N/A 10/22/2019   Procedure: EXPLORATORY LAPAROTOMY;  Surgeon: Diamantina Monks, MD;  Location: [facility] OR;  Service: General;  Laterality: N/A;   SMALL BOWEL REPAIR N/A 10/22/2019   Procedure: Small Bowel Repair;  Surgeon: Diamantina Monks, MD;  Location: [facility] OR;  Service: General;  Laterality: N/A;   Patient Active Problem List   Diagnosis Date Noted   Vaccine counseling 12/24/2022   Insomnia 12/24/2022   Fatigue 12/24/2022   Contusion 10/22/2019   Zoster 12/28/2018   Emphysema of lung (HCC) 01/02/2017   Smoker 01/02/2017   Rash and nonspecific skin eruption 12/07/2015   HIV disease (HCC) 03/06/2010    PCP: None  REFERRING PROVIDER: Dion Saucier D, PA  REFERRING DIAG: neck pain  THERAPY DIAG:  Cervicalgia  Chronic right shoulder pain  Pain in right arm  Rationale for Evaluation and Treatment: Rehabilitation  ONSET DATE: Chronic since 2021   SUBJECTIVE SUBJECTIVE STATEMENT: Pt  presents to PT with reports of no current neck pain. Has been compliant with HEP.   EVAL: Patient reports in 2021 he had an accident where he had surgery, then when he got home his neck, right shoulder, chest, and upper back hurt. Then about 4 years later (2025) the pain in the right neck, shoulder, and arm started hurting again with no mechanism. He was given a steroid shot and prescription. He did have rotator cuff surgery on the left side and the right shoulder started feeling the same. The doctor told him he may have a pinched nerve in the right side. He also notes some pain in his legs that came on with his neck pain as well. He reports that currently when he has pain it is mainly on the right side from the right neck, shoulder, arm, and even into the leg. He also wakes up hurting so is wondering if how he sleeps affects his pain. Very rarely his right hand will tingle but denies much numbness. When his right shoulder is bothering him he has trouble lifting it. He did have an MRI on his neck yesterday and follows up with his provider on 12/03/2023.  Hand dominance: Right  PERTINENT HISTORY:  See PMH above  PAIN:  Are you having pain? Yes:  NPRS scale: 3/10 currently, 6-8/10 at worst Pain location: Right neck, shoulder, arm, upper back Pain description: Aching, tired, sharp  Aggravating factors: Sitting, turning head to right Relieving factors: Medication  PRECAUTIONS: None  RED FLAGS: None    WEIGHT BEARING RESTRICTIONS: No  FALLS:  Has patient fallen in last 6 months? No  OCCUPATION: Out of work, oversee and operating machines for paint, forklift driving, stacking cans  PLOF: Independent  PATIENT GOALS: Pain relief so he can return to work   OBJECTIVE:  Note: Objective measures were completed at Evaluation unless otherwise noted. PATIENT SURVEYS:  NDI 24/50 (48%)  COGNITION: Overall cognitive status: Within functional limits for tasks  assessed  SENSATION: WFL  POSTURE:   Rounded shoulder and forward head  PALPATION: Tender to palpation right upper trap region, cervical paraspinals and suboccipitals,    CERVICAL ROM:   Active ROM A/PROM (deg) eval 4/8   Flexion 55   Extension 40   Right lateral flexion 25   Left lateral flexion 45   Right rotation 50 70  Left rotation 55 70   (Blank rows = not tested)  UPPER EXTREMITY ROM:   UE ROM grossly WFL  UPPER EXTREMITY MMT:  MMT Right eval Left eval  Shoulder flexion 4 5  Shoulder extension 5 5  Shoulder abduction 4 5  Shoulder adduction    Shoulder extension    Shoulder internal rotation 5 5  Shoulder external rotation 5 5  Middle trapezius 4- 4-  Lower trapezius 4- 4-  Elbow flexion    Elbow extension    Wrist flexion    Wrist extension    Wrist ulnar deviation    Wrist radial deviation    Wrist pronation    Wrist supination    Grip strength     (Blank rows = not tested)  CERVICAL SPECIAL TESTS:  Spurling's positive on right  FUNCTIONAL TESTS:  DNF endurance: 9 seconds   TREATMENT [facility] Adult PT Treatment:                                                DATE: 12/18/2023 Therapeutic Exercise:  UBE lvl 1.5 x 3 min (fwd/bwd) for functional endurance Supine chin tuck x 10 - 5" hold With lift 2x to fatigue ~40" Chin tuck at wall YTB bil ER 2x15 Prone IYT 2x10 each Supine horizontal abd 2x15 GTB Manual Therapy CPA and UPA mid to lower lumbar spine L>R Manual cervical retraction Suboccipital release  PATIENT EDUCATION:  Education details: HEP update Person educated: Patient Education method: Explanation, Demonstration, Tactile cues, Verbal cues, and Handouts Education comprehension: verbalized understanding, returned demonstration, verbal cues required, tactile cues required, and needs further education  HOME EXERCISE PROGRAM: Access Code: [email or web address] URL: [email or web address] Date: 12/18/2023 Prepared by: Edwinna Areola  Exercises - Seated Cervical Sidebending Stretch  - 2 x daily - 3 reps - 15 seconds hold - Seated Neck Retraction  - 3-5 x daily - 10 reps - 5 seconds hold - Supine Chin Tuck  - 1 x daily - 7 x weekly - 2 sets - 10 reps - 3 sec hold - Supine Shoulder Horizontal Abduction with Resistance  - 1 x daily - 7 x weekly - 3 sets - 10 reps - green band hold - Shoulder External Rotation and Scapular Retraction with Resistance  - 1 x daily - 7 x weekly - 2-3 sets - 10 reps - green band hold - Standing Shoulder Row with Anchored Resistance  -  1 x daily - 7 x weekly - 3 sets - 10 reps - blue band hold - Supine Deep Neck Flexor Training - Hold  - 1 x daily - 7 x weekly - 1 sets - 10 reps - 5 second hold - Sidelying Open Book Thoracic Lumbar Rotation and Extension  - 1 x daily - 7 x weekly - 10 reps - Prone Scapular Retraction Y  - 3 x weekly - 2 sets - 10 reps - Prone Scapular Retraction Arms at Side  - 3 x weekly - 2 sets - 10 reps   ASSESSMENT: CLINICAL IMPRESSION: Pt was able to complete prescribed exercises with no adverse effect. Exercises today focused on improving DNF endurance and periscapular strength in order to continue towards reducing neck pain. Responded well to manual interventions and is progressing well with therapy overall, having no neck pain today. He continues to benefit from skilled PT services, will continue with periscapular strengthening progression which was added to HEP.   EVAL: Patient is a 43 y.o. male who was seen today for physical therapy evaluation and treatment primarily for chronic right sided neck, shoulder, and arm pain. He also notes occasional back, chest, and LE pain. Currently his symptoms do seem consistent with right cervical radiculopathic pain. He exhibits limitations with his cervical motion and with increased muscular tension on right side. Postural deviations with DNF endurance deficit and periscapular strength impairment.   OBJECTIVE IMPAIRMENTS:  decreased activity tolerance, decreased ROM, decreased strength, postural dysfunction, and pain.   ACTIVITY LIMITATIONS: lifting, sitting, and sleeping  PARTICIPATION LIMITATIONS: occupation  PERSONAL FACTORS: Past/current experiences and Time since onset of injury/illness/exacerbation are also affecting patient's functional outcome.   REHAB POTENTIAL: Good  CLINICAL DECISION MAKING: Stable/uncomplicated  EVALUATION COMPLEXITY: Low   GOALS: Goals reviewed with patient? Yes  SHORT TERM GOALS: Target date: 12/23/2023  Patient will be I with initial HEP in order to progress with therapy. Baseline: HEP provided at eval Goal status: MET  2.  Patient will demonstrate improvement in cervical rotation >/= 10 deg bilaterally in order to improve mobility and reduce neck pain Baseline: see limitations above Goal status: INITIAL  LONG TERM GOALS: Target date: 01/20/2024  Patient will be I with final HEP to maintain progress from PT. Baseline: HEP provided at eval Goal status: INITIAL  2.  Patient will report </= 24% disability on the NDI in order to indicate improvement in functional status Baseline: 48% Goal status: INITIAL  3.  Patient will demonstrate DNF endurance >/= 30 seconds to improve postural control and reduce neck pain Baseline: 9 seconds Goal status: INITIAL  4.  Patient will report neck and shoulder/arm pain </= 4/10 with all activity in order to reduce functional limitations and return to work Baseline: 6-8/10 Goal status: INITIAL   PLAN: PT FREQUENCY: 1-2x/week  PT DURATION: 8 weeks  PLANNED INTERVENTIONS: 97164- PT Re-evaluation, 97110-Therapeutic exercises, 97530- Therapeutic activity, 97112- Neuromuscular re-education, 97535- Self Care, 29562- Manual therapy, [id number]- Electrical stimulation (unattended), [phone]- Electrical stimulation (manual), [id number]- Traction (mechanical), Patient/Family education, Taping, Dry Needling, Joint mobilization, Joint manipulation,  Spinal manipulation, Spinal mobilization, Cryotherapy, and Moist heat  PLAN FOR NEXT SESSION: Review HEP and progress PRN, manual/TPDN for right shoulder and neck region, cervical mobs to improve motion and reduce pain, assess neurodynamics, progress postural exercises  Eloy End PT  12/18/23 8:46 AM

## 2023-12-22 ENCOUNTER — Encounter

## 2023-12-23 ENCOUNTER — Ambulatory Visit: Admitting: Physical Therapy

## 2023-12-24 ENCOUNTER — Other Ambulatory Visit: Payer: 59

## 2023-12-25 ENCOUNTER — Other Ambulatory Visit: Payer: Self-pay

## 2023-12-25 ENCOUNTER — Other Ambulatory Visit

## 2023-12-25 ENCOUNTER — Other Ambulatory Visit (HOSPITAL_COMMUNITY)
Admission: RE | Admit: 2023-12-25 | Discharge: 2023-12-25 | Disposition: A | Discharge status: Home / Self Care | Source: Ambulatory Visit | Attending: Infectious Disease | Admitting: Infectious Disease

## 2023-12-25 ENCOUNTER — Ambulatory Visit

## 2023-12-25 DIAGNOSIS — Z113 Encounter for screening for infections with a predominantly sexual mode of transmission: Secondary | ICD-10-CM | POA: Diagnosis present

## 2023-12-25 DIAGNOSIS — G8929 Other chronic pain: Secondary | ICD-10-CM

## 2023-12-25 DIAGNOSIS — Z79899 Other long term (current) drug therapy: Secondary | ICD-10-CM | POA: Insufficient documentation

## 2023-12-25 DIAGNOSIS — M542 Cervicalgia: Secondary | ICD-10-CM

## 2023-12-25 DIAGNOSIS — B20 Human immunodeficiency virus [HIV] disease: Secondary | ICD-10-CM

## 2023-12-25 NOTE — Therapy (Signed)
 OUTPATIENT PHYSICAL THERAPY TREATMENT   Patient Name: Jeremy Harper MRN: [id number] DOB:August 31, 1981, 43 y.o., male Today's Date: 12/25/2023   END OF SESSION:  PT End of Session - 12/25/23 0849     Visit Number 8    Number of Visits 17    Date for PT Re-Evaluation 01/20/24    Authorization Type [employer]    PT Start Time 0849   arrived late   PT Stop Time 0918    PT Time Calculation (min) 29 min    Activity Tolerance Patient tolerated treatment well    Behavior During Therapy [facility] for tasks assessed/performed                 Past Medical History:  Diagnosis Date   AIDS (HCC) 12/07/2015   Emphysema of lung (HCC) 01/02/2017   Fatigue 12/24/2022   Insomnia 12/24/2022   Rash and nonspecific skin eruption 12/07/2015   Shoulder injury 2025   Smoker 01/02/2017   Thrush 12/07/2015   Vaccine counseling 12/24/2022   Zoster 12/28/2018   Past Surgical History:  Procedure Laterality Date   LAPAROSCOPY N/A 10/22/2019   Procedure: LAPAROSCOPY DIAGNOSTIC;  Surgeon: Anda Bamberg, MD;  Location: [facility] OR;  Service: General;  Laterality: N/A;   LAPAROTOMY N/A 10/22/2019   Procedure: EXPLORATORY LAPAROTOMY;  Surgeon: Anda Bamberg, MD;  Location: [facility] OR;  Service: General;  Laterality: N/A;   SMALL BOWEL REPAIR N/A 10/22/2019   Procedure: Small Bowel Repair;  Surgeon: Anda Bamberg, MD;  Location: [facility] OR;  Service: General;  Laterality: N/A;   Patient Active Problem List   Diagnosis Date Noted   Vaccine counseling 12/24/2022   Insomnia 12/24/2022   Fatigue 12/24/2022   Contusion 10/22/2019   Zoster 12/28/2018   Emphysema of lung (HCC) 01/02/2017   Smoker 01/02/2017   Rash and nonspecific skin eruption 12/07/2015   HIV disease (HCC) 03/06/2010    PCP: None  REFERRING PROVIDER: Karyl Paget D, PA  REFERRING DIAG: neck pain  THERAPY DIAG:  Cervicalgia  Chronic right shoulder pain  Rationale for Evaluation and Treatment: Rehabilitation  ONSET DATE: Chronic since  2021   SUBJECTIVE SUBJECTIVE STATEMENT: Pt presents to PT with reports of no current neck pain. Had a cervical and shoulder injection on 4/15. Has been compliant with HEP.   EVAL: Patient reports in 2021 he had an accident where he had surgery, then when he got home his neck, right shoulder, chest, and upper back hurt. Then about 4 years later (2025) the pain in the right neck, shoulder, and arm started hurting again with no mechanism. He was given a steroid shot and prescription. He did have rotator cuff surgery on the left side and the right shoulder started feeling the same. The doctor told him he may have a pinched nerve in the right side. He also notes some pain in his legs that came on with his neck pain as well. He reports that currently when he has pain it is mainly on the right side from the right neck, shoulder, arm, and even into the leg. He also wakes up hurting so is wondering if how he sleeps affects his pain. Very rarely his right hand will tingle but denies much numbness. When his right shoulder is bothering him he has trouble lifting it. He did have an MRI on his neck yesterday and follows up with his provider on 12/03/2023.  Hand dominance: Right  PERTINENT HISTORY:  See PMH above  PAIN:  Are you having  pain? Yes:  NPRS scale: 3/10 currently, 6-8/10 at worst Pain location: Right neck, shoulder, arm, upper back Pain description: Aching, tired, sharp Aggravating factors: Sitting, turning head to right Relieving factors: Medication  PRECAUTIONS: None  RED FLAGS: None    WEIGHT BEARING RESTRICTIONS: No  FALLS:  Has patient fallen in last 6 months? No  OCCUPATION: Out of work, oversee and operating machines for paint, forklift driving, stacking cans  PLOF: Independent  PATIENT GOALS: Pain relief so he can return to work   OBJECTIVE:  Note: Objective measures were completed at Evaluation unless otherwise noted. PATIENT SURVEYS:  NDI 24/50 (48%) 12/25/2023: 16/50  (32%)  COGNITION: Overall cognitive status: Within functional limits for tasks assessed  SENSATION: WFL  POSTURE:   Rounded shoulder and forward head  PALPATION: Tender to palpation right upper trap region, cervical paraspinals and suboccipitals,    CERVICAL ROM:   Active ROM A/PROM (deg) eval 4/8   Flexion 55   Extension 40   Right lateral flexion 25   Left lateral flexion 45   Right rotation 50 70  Left rotation 55 70   (Blank rows = not tested)  UPPER EXTREMITY ROM:   UE ROM grossly WFL  UPPER EXTREMITY MMT:  MMT Right eval Left eval  Shoulder flexion 4 5  Shoulder extension 5 5  Shoulder abduction 4 5  Shoulder adduction    Shoulder extension    Shoulder internal rotation 5 5  Shoulder external rotation 5 5  Middle trapezius 4- 4-  Lower trapezius 4- 4-  Elbow flexion    Elbow extension    Wrist flexion    Wrist extension    Wrist ulnar deviation    Wrist radial deviation    Wrist pronation    Wrist supination    Grip strength     (Blank rows = not tested)  CERVICAL SPECIAL TESTS:  Spurling's positive on right  FUNCTIONAL TESTS:  DNF endurance: 9 seconds   TREATMENT [facility] Adult PT Treatment:                                                DATE: 12/25/2023 Therapeutic Exercise:  UBE lvl 1.5 x 4 min (fwd/bwd) for functional endurance Supine chin tuck x 10 - 5" hold With lift 2x to fatigue ~40" Chin tuck at wall YTB bil ER 2x15 Prone I 2x10 each POE chin tuck x 10 - 3" hold  PATIENT EDUCATION:  Education details: HEP update Person educated: Patient Education method: Explanation, Demonstration, Tactile cues, Verbal cues, and Handouts Education comprehension: verbalized understanding, returned demonstration, verbal cues required, tactile cues required, and needs further education  HOME EXERCISE PROGRAM: Access Code: [email or web address] URL: [email or web address] Date: 12/18/2023 Prepared by: Edwinna Areola  Exercises - Seated Cervical  Sidebending Stretch  - 2 x daily - 3 reps - 15 seconds hold - Seated Neck Retraction  - 3-5 x daily - 10 reps - 5 seconds hold - Supine Chin Tuck  - 1 x daily - 7 x weekly - 2 sets - 10 reps - 3 sec hold - Supine Shoulder Horizontal Abduction with Resistance  - 1 x daily - 7 x weekly - 3 sets - 10 reps - green band hold - Shoulder External Rotation and Scapular Retraction with Resistance  - 1 x daily - 7 x weekly - 2-3 sets - 10 reps -  green band hold - Standing Shoulder Row with Anchored Resistance  - 1 x daily - 7 x weekly - 3 sets - 10 reps - blue band hold - Supine Deep Neck Flexor Training - Hold  - 1 x daily - 7 x weekly - 1 sets - 10 reps - 5 second hold - Sidelying Open Book Thoracic Lumbar Rotation and Extension  - 1 x daily - 7 x weekly - 10 reps - Prone Scapular Retraction Y  - 3 x weekly - 2 sets - 10 reps - Prone Scapular Retraction Arms at Side  - 3 x weekly - 2 sets - 10 reps   ASSESSMENT: CLINICAL IMPRESSION: Pt was able to complete all prescribed exercises with reports of slight increase in R shoulder pain with prone exercises. Since he is roughly 48 hours post injection we did not push him too much today. His NDI score improved, showing subjective increase in functional ability.   EVAL: Patient is a 43 y.o. male who was seen today for physical therapy evaluation and treatment primarily for chronic right sided neck, shoulder, and arm pain. He also notes occasional back, chest, and LE pain. Currently his symptoms do seem consistent with right cervical radiculopathic pain. He exhibits limitations with his cervical motion and with increased muscular tension on right side. Postural deviations with DNF endurance deficit and periscapular strength impairment.   OBJECTIVE IMPAIRMENTS: decreased activity tolerance, decreased ROM, decreased strength, postural dysfunction, and pain.   ACTIVITY LIMITATIONS: lifting, sitting, and sleeping  PARTICIPATION LIMITATIONS: occupation  PERSONAL  FACTORS: Past/current experiences and Time since onset of injury/illness/exacerbation are also affecting patient's functional outcome.   REHAB POTENTIAL: Good  CLINICAL DECISION MAKING: Stable/uncomplicated  EVALUATION COMPLEXITY: Low   GOALS: Goals reviewed with patient? Yes  SHORT TERM GOALS: Target date: 12/23/2023  Patient will be I with initial HEP in order to progress with therapy. Baseline: HEP provided at eval Goal status: MET  2.  Patient will demonstrate improvement in cervical rotation >/= 10 deg bilaterally in order to improve mobility and reduce neck pain Baseline: see limitations above Goal status: IN PROGRESS  LONG TERM GOALS: Target date: 01/20/2024  Patient will be I with final HEP to maintain progress from PT. Baseline: HEP provided at eval Goal status: IN PROGRESS  2.  Patient will report </= 24% disability on the NDI in order to indicate improvement in functional status Baseline: 48% 12/25/2023: 16/50 (32%) Goal status: IN PROGRESS  3.  Patient will demonstrate DNF endurance >/= 30 seconds to improve postural control and reduce neck pain Baseline: 9 seconds Goal status: IN PROGRESS  4.  Patient will report neck and shoulder/arm pain </= 4/10 with all activity in order to reduce functional limitations and return to work Baseline: 6-8/10 Goal status: IN PROGRESS   PLAN: PT FREQUENCY: 1-2x/week  PT DURATION: 8 weeks  PLANNED INTERVENTIONS: 97164- PT Re-evaluation, 97110-Therapeutic exercises, 97530- Therapeutic activity, 97112- Neuromuscular re-education, 97535- Self Care, 21308- Manual therapy, [id number]- Electrical stimulation (unattended), [phone]- Electrical stimulation (manual), 97012- Traction (mechanical), Patient/Family education, Taping, Dry Needling, Joint mobilization, Joint manipulation, Spinal manipulation, Spinal mobilization, Cryotherapy, and Moist heat  PLAN FOR NEXT SESSION: Review HEP and progress PRN, manual/TPDN for right shoulder and neck  region, cervical mobs to improve motion and reduce pain, assess neurodynamics, progress postural exercises  Eloy End PT  12/25/23 9:34 AM

## 2023-12-26 LAB — URINE CYTOLOGY ANCILLARY ONLY
Chlamydia: NEGATIVE
Comment: NEGATIVE
Comment: NORMAL
Neisseria Gonorrhea: NEGATIVE

## 2023-12-28 LAB — CBC WITH DIFFERENTIAL/PLATELET
Absolute Lymphocytes: 3302 {cells}/uL (ref 850–3900)
Absolute Monocytes: 529 {cells}/uL (ref 200–950)
Basophils Absolute: 32 {cells}/uL (ref 0–200)
Basophils Relative: 0.4 %
Eosinophils Absolute: 71 {cells}/uL (ref 15–500)
Eosinophils Relative: 0.9 %
HCT: 39.5 % (ref 38.5–50.0)
Hemoglobin: 13.5 g/dL (ref 13.2–17.1)
MCH: 31.5 pg (ref 27.0–33.0)
MCHC: 34.2 g/dL (ref 32.0–36.0)
MCV: 92.1 fL (ref 80.0–100.0)
MPV: 10.8 fL (ref 7.5–12.5)
Monocytes Relative: 6.7 %
Neutro Abs: 3966 {cells}/uL (ref 1500–7800)
Neutrophils Relative %: 50.2 %
Platelets: 233 10*3/uL (ref 140–400)
RBC: 4.29 10*6/uL (ref 4.20–5.80)
RDW: 12.9 % (ref 11.0–15.0)
Total Lymphocyte: 41.8 %
WBC: 7.9 10*3/uL (ref 3.8–10.8)

## 2023-12-28 LAB — COMPLETE METABOLIC PANEL WITHOUT GFR
AG Ratio: 1.5 (calc) (ref 1.0–2.5)
ALT: 23 U/L (ref 9–46)
AST: 15 U/L (ref 10–40)
Albumin: 4 g/dL (ref 3.6–5.1)
Alkaline phosphatase (APISO): 96 U/L (ref 36–130)
BUN: 9 mg/dL (ref 7–25)
CO2: 26 mmol/L (ref 20–32)
Calcium: 9 mg/dL (ref 8.6–10.3)
Chloride: 106 mmol/L (ref 98–110)
Creat: 1.04 mg/dL (ref 0.60–1.29)
Globulin: 2.6 g/dL (ref 1.9–3.7)
Glucose, Bld: 115 mg/dL — ABNORMAL HIGH (ref 65–99)
Potassium: 3.8 mmol/L (ref 3.5–5.3)
Sodium: 141 mmol/L (ref 135–146)
Total Bilirubin: 0.4 mg/dL (ref 0.2–1.2)
Total Protein: 6.6 g/dL (ref 6.1–8.1)

## 2023-12-28 LAB — LIPID PANEL
Cholesterol: 154 mg/dL (ref <200)
HDL: 47 mg/dL (ref >=40)
LDL Cholesterol (Calc): 83 mg/dL
Non-HDL Cholesterol (Calc): 107 mg/dL (ref <130)
Total CHOL/HDL Ratio: 3.3 (calc) (ref <5.0)
Triglycerides: 137 mg/dL (ref <150)

## 2023-12-28 LAB — HIV-1 RNA QUANT-NO REFLEX-BLD
HIV 1 RNA Quant: 30 {copies}/mL — ABNORMAL HIGH
HIV-1 RNA Quant, Log: 1.48 {Log_copies}/mL — ABNORMAL HIGH

## 2023-12-28 LAB — T-HELPER CELLS (CD4) COUNT (NOT AT ARMC)
Absolute CD4: 786 {cells}/uL (ref 490–1740)
CD4 T Helper %: 22 % — ABNORMAL LOW (ref 30–61)
Total lymphocyte count: 3547 {cells}/uL (ref 850–3900)

## 2023-12-28 LAB — RPR: RPR Ser Ql: NONREACTIVE

## 2023-12-29 ENCOUNTER — Other Ambulatory Visit: Payer: Self-pay

## 2023-12-31 ENCOUNTER — Ambulatory Visit: Admitting: Physical Therapy

## 2023-12-31 ENCOUNTER — Other Ambulatory Visit: Payer: Self-pay

## 2023-12-31 ENCOUNTER — Encounter: Payer: Self-pay | Admitting: Physical Therapy

## 2023-12-31 DIAGNOSIS — M6281 Muscle weakness (generalized): Secondary | ICD-10-CM

## 2023-12-31 DIAGNOSIS — G8929 Other chronic pain: Secondary | ICD-10-CM

## 2023-12-31 DIAGNOSIS — M542 Cervicalgia: Secondary | ICD-10-CM | POA: Diagnosis not present

## 2023-12-31 DIAGNOSIS — M79601 Pain in right arm: Secondary | ICD-10-CM

## 2023-12-31 NOTE — Therapy (Unsigned)
 OUTPATIENT PHYSICAL THERAPY TREATMENT   Patient Name: Jeremy Harper MRN: [id number] DOB:May 26, 1981, 43 y.o., male Today's Date: 01/01/2024   END OF SESSION:  PT End of Session - 12/31/23 1638     Visit Number 9    Number of Visits 17    Date for PT Re-Evaluation 01/20/24    Authorization Type [employer]    PT Start Time 0437    PT Stop Time 0515    PT Time Calculation (min) 38 min    Activity Tolerance Patient tolerated treatment well    Behavior During Therapy [facility] for tasks assessed/performed                 Past Medical History:  Diagnosis Date   AIDS (HCC) 12/07/2015   Emphysema of lung (HCC) 01/02/2017   Fatigue 12/24/2022   Insomnia 12/24/2022   Rash and nonspecific skin eruption 12/07/2015   Shoulder injury 2025   Smoker 01/02/2017   Thrush 12/07/2015   Vaccine counseling 12/24/2022   Zoster 12/28/2018   Past Surgical History:  Procedure Laterality Date   LAPAROSCOPY N/A 10/22/2019   Procedure: LAPAROSCOPY DIAGNOSTIC;  Surgeon: Anda Bamberg, MD;  Location: [facility] OR;  Service: General;  Laterality: N/A;   LAPAROTOMY N/A 10/22/2019   Procedure: EXPLORATORY LAPAROTOMY;  Surgeon: Anda Bamberg, MD;  Location: [facility] OR;  Service: General;  Laterality: N/A;   SMALL BOWEL REPAIR N/A 10/22/2019   Procedure: Small Bowel Repair;  Surgeon: Anda Bamberg, MD;  Location: [facility] OR;  Service: General;  Laterality: N/A;   Patient Active Problem List   Diagnosis Date Noted   Vaccine counseling 12/24/2022   Insomnia 12/24/2022   Fatigue 12/24/2022   Contusion 10/22/2019   Zoster 12/28/2018   Emphysema of lung (HCC) 01/02/2017   Smoker 01/02/2017   Rash and nonspecific skin eruption 12/07/2015   HIV disease (HCC) 03/06/2010    PCP: None  REFERRING PROVIDER: Karyl Paget D, PA  REFERRING DIAG: neck pain  THERAPY DIAG:  Cervicalgia  Chronic right shoulder pain  Pain in right arm  Muscle weakness (generalized)  Rationale for Evaluation and Treatment:  Rehabilitation  ONSET DATE: Chronic since 2021   SUBJECTIVE SUBJECTIVE STATEMENT: Pt reports that that he continues to have some discomfort in the R shoulder and R sided Cx spine.  EVAL: Patient reports in 2021 he had an accident where he had surgery, then when he got home his neck, right shoulder, chest, and upper back hurt. Then about 4 years later (2025) the pain in the right neck, shoulder, and arm started hurting again with no mechanism. He was given a steroid shot and prescription. He did have rotator cuff surgery on the left side and the right shoulder started feeling the same. The doctor told him he may have a pinched nerve in the right side. He also notes some pain in his legs that came on with his neck pain as well. He reports that currently when he has pain it is mainly on the right side from the right neck, shoulder, arm, and even into the leg. He also wakes up hurting so is wondering if how he sleeps affects his pain. Very rarely his right hand will tingle but denies much numbness. When his right shoulder is bothering him he has trouble lifting it. He did have an MRI on his neck yesterday and follows up with his provider on 12/03/2023.  Hand dominance: Right  PERTINENT HISTORY:  See PMH above  PAIN:  Are you having  pain? Yes:  NPRS scale: 3/10 currently, 6-8/10 at worst Pain location: Right neck, shoulder, arm, upper back Pain description: Aching, tired, sharp Aggravating factors: Sitting, turning head to right Relieving factors: Medication  PRECAUTIONS: None  RED FLAGS: None    WEIGHT BEARING RESTRICTIONS: No  FALLS:  Has patient fallen in last 6 months? No  OCCUPATION: Out of work, oversee and operating machines for paint, forklift driving, stacking cans  PLOF: Independent  PATIENT GOALS: Pain relief so he can return to work   OBJECTIVE:  Note: Objective measures were completed at Evaluation unless otherwise noted. PATIENT SURVEYS:  NDI 24/50  (48%) 12/25/2023: 16/50 (32%)  COGNITION: Overall cognitive status: Within functional limits for tasks assessed  SENSATION: WFL  POSTURE:   Rounded shoulder and forward head  PALPATION: Tender to palpation right upper trap region, cervical paraspinals and suboccipitals,    CERVICAL ROM:   Active ROM A/PROM (deg) eval 4/8   Flexion 55   Extension 40   Right lateral flexion 25   Left lateral flexion 45   Right rotation 50 70  Left rotation 55 70   (Blank rows = not tested)  UPPER EXTREMITY ROM:   UE ROM grossly WFL  UPPER EXTREMITY MMT:  MMT Right eval Left eval  Shoulder flexion 4 5  Shoulder extension 5 5  Shoulder abduction 4 5  Shoulder adduction    Shoulder extension    Shoulder internal rotation 5 5  Shoulder external rotation 5 5  Middle trapezius 4- 4-  Lower trapezius 4- 4-  Elbow flexion    Elbow extension    Wrist flexion    Wrist extension    Wrist ulnar deviation    Wrist radial deviation    Wrist pronation    Wrist supination    Grip strength     (Blank rows = not tested)  CERVICAL SPECIAL TESTS:  Spurling's positive on right  FUNCTIONAL TESTS:  DNF endurance: 9 seconds   TREATMENT [facility] Adult PT Treatment:                                                DATE: 01/01/2024 Therapeutic Exercise:  UBE lvl 3 x 4 min (fwd/bwd) for functional endurance Corner pec stretch - 2x 45'' Supine chin tuck x 10 - 5" hold With lift 2x to fatigue ~40" Chin tuck at wall YTB bil ER 2x15 Lateral band walk on wall - 3x to fatigue POE chin tuck x 10 - 5" hold Row - 13# - 2x15  PATIENT EDUCATION:  Education details: HEP update Person educated: Patient Education method: Explanation, Demonstration, Tactile cues, Verbal cues, and Handouts Education comprehension: verbalized understanding, returned demonstration, verbal cues required, tactile cues required, and needs further education  HOME EXERCISE PROGRAM: Access Code: [email or web address] URL:  [email or web address] Date: 12/18/2023 Prepared by: Loral Roch  Exercises - Seated Cervical Sidebending Stretch  - 2 x daily - 3 reps - 15 seconds hold - Seated Neck Retraction  - 3-5 x daily - 10 reps - 5 seconds hold - Supine Chin Tuck  - 1 x daily - 7 x weekly - 2 sets - 10 reps - 3 sec hold - Supine Shoulder Horizontal Abduction with Resistance  - 1 x daily - 7 x weekly - 3 sets - 10 reps - green band hold - Shoulder External Rotation and Scapular Retraction with  Resistance  - 1 x daily - 7 x weekly - 2-3 sets - 10 reps - green band hold - Standing Shoulder Row with Anchored Resistance  - 1 x daily - 7 x weekly - 3 sets - 10 reps - blue band hold - Supine Deep Neck Flexor Training - Hold  - 1 x daily - 7 x weekly - 1 sets - 10 reps - 5 second hold - Sidelying Open Book Thoracic Lumbar Rotation and Extension  - 1 x daily - 7 x weekly - 10 reps - Prone Scapular Retraction Y  - 3 x weekly - 2 sets - 10 reps - Prone Scapular Retraction Arms at Side  - 3 x weekly - 2 sets - 10 reps   ASSESSMENT: CLINICAL IMPRESSION: Pt reports that overall he is improved since starting PT but that he continues to have significant pain intermittently.  We continued to work on DNF endurance and periscapular strengthening.  Avoided prone strengthening as this has been agging recently.  Athena Bland is able to complete exercises but has significant weakness in bil shoulders which; possibly d/t neural impingement vs local muscle weakness.  He has not been to a spine specialist as far as he knows.  EVAL: Patient is a 43 y.o. male who was seen today for physical therapy evaluation and treatment primarily for chronic right sided neck, shoulder, and arm pain. He also notes occasional back, chest, and LE pain. Currently his symptoms do seem consistent with right cervical radiculopathic pain. He exhibits limitations with his cervical motion and with increased muscular tension on right side. Postural deviations with  DNF endurance deficit and periscapular strength impairment.   OBJECTIVE IMPAIRMENTS: decreased activity tolerance, decreased ROM, decreased strength, postural dysfunction, and pain.   ACTIVITY LIMITATIONS: lifting, sitting, and sleeping  PARTICIPATION LIMITATIONS: occupation  PERSONAL FACTORS: Past/current experiences and Time since onset of injury/illness/exacerbation are also affecting patient's functional outcome.   REHAB POTENTIAL: Good  CLINICAL DECISION MAKING: Stable/uncomplicated  EVALUATION COMPLEXITY: Low   GOALS: Goals reviewed with patient? Yes  SHORT TERM GOALS: Target date: 12/23/2023  Patient will be I with initial HEP in order to progress with therapy. Baseline: HEP provided at eval Goal status: MET  2.  Patient will demonstrate improvement in cervical rotation >/= 10 deg bilaterally in order to improve mobility and reduce neck pain Baseline: see limitations above Goal status: IN PROGRESS  LONG TERM GOALS: Target date: 01/20/2024  Patient will be I with final HEP to maintain progress from PT. Baseline: HEP provided at eval Goal status: IN PROGRESS  2.  Patient will report </= 24% disability on the NDI in order to indicate improvement in functional status Baseline: 48% 12/25/2023: 16/50 (32%) Goal status: IN PROGRESS  3.  Patient will demonstrate DNF endurance >/= 30 seconds to improve postural control and reduce neck pain Baseline: 9 seconds Goal status: IN PROGRESS  4.  Patient will report neck and shoulder/arm pain </= 4/10 with all activity in order to reduce functional limitations and return to work Baseline: 6-8/10 Goal status: IN PROGRESS   PLAN: PT FREQUENCY: 1-2x/week  PT DURATION: 8 weeks  PLANNED INTERVENTIONS: 97164- PT Re-evaluation, 97110-Therapeutic exercises, 97530- Therapeutic activity, [id number]- Neuromuscular re-education, 97535- Self Care, 16109- Manual therapy, G0283- Electrical stimulation (unattended), [id number]- Electrical stimulation  (manual), [id number]- Traction (mechanical), Patient/Family education, Taping, Dry Needling, Joint mobilization, Joint manipulation, Spinal manipulation, Spinal mobilization, Cryotherapy, and Moist heat  PLAN FOR NEXT SESSION: Review HEP and progress PRN, manual/TPDN for right shoulder and  neck region, cervical mobs to improve motion and reduce pain, assess neurodynamics, progress postural exercises  Donovan Gallant Uyen Eichholz PT  01/01/24 7:11 AM

## 2023-12-31 NOTE — Progress Notes (Signed)
 Specialty Pharmacy Refill Coordination Note  Jeremy Harper is a 43 y.o. male contacted today regarding refills of specialty medication(s) Bictegravir-Emtricitab-Tenofov (BIKTARVY )   Patient requested Delivery   Delivery date: 01/02/24   Verified address: [address]   [place] [address]   Medication will be filled on 01/01/24.

## 2024-01-01 ENCOUNTER — Other Ambulatory Visit: Payer: Self-pay

## 2024-01-01 ENCOUNTER — Encounter: Payer: Self-pay | Admitting: Physical Therapy

## 2024-01-02 ENCOUNTER — Encounter: Payer: Self-pay | Admitting: Physical Therapy

## 2024-01-02 ENCOUNTER — Ambulatory Visit: Admitting: Physical Therapy

## 2024-01-02 DIAGNOSIS — G8929 Other chronic pain: Secondary | ICD-10-CM

## 2024-01-02 DIAGNOSIS — M79601 Pain in right arm: Secondary | ICD-10-CM

## 2024-01-02 DIAGNOSIS — M542 Cervicalgia: Secondary | ICD-10-CM

## 2024-01-02 DIAGNOSIS — M6281 Muscle weakness (generalized): Secondary | ICD-10-CM

## 2024-01-02 NOTE — Therapy (Signed)
 OUTPATIENT PHYSICAL THERAPY TREATMENT   Patient Name: Jeremy Harper MRN: [id number] DOB:1981-03-19, 43 y.o., male Today's Date: 01/02/2024   END OF SESSION:  PT End of Session - 01/02/24 0721     Visit Number 10    Number of Visits 17    Date for PT Re-Evaluation 01/20/24    Authorization Type U[employer]    PT Start Time 0721    PT Stop Time 0800    PT Time Calculation (min) 39 min    Activity Tolerance Patient tolerated treatment well    Behavior During Therapy [facility] for tasks assessed/performed                 Past Medical History:  Diagnosis Date   AIDS (HCC) 12/07/2015   Emphysema of lung (HCC) 01/02/2017   Fatigue 12/24/2022   Insomnia 12/24/2022   Rash and nonspecific skin eruption 12/07/2015   Shoulder injury 2025   Smoker 01/02/2017   Thrush 12/07/2015   Vaccine counseling 12/24/2022   Zoster 12/28/2018   Past Surgical History:  Procedure Laterality Date   LAPAROSCOPY N/A 10/22/2019   Procedure: LAPAROSCOPY DIAGNOSTIC;  Surgeon: Anda Bamberg, MD;  Location: [facility] OR;  Service: General;  Laterality: N/A;   LAPAROTOMY N/A 10/22/2019   Procedure: EXPLORATORY LAPAROTOMY;  Surgeon: Anda Bamberg, MD;  Location: [facility] OR;  Service: General;  Laterality: N/A;   SMALL BOWEL REPAIR N/A 10/22/2019   Procedure: Small Bowel Repair;  Surgeon: Anda Bamberg, MD;  Location: [facility] OR;  Service: General;  Laterality: N/A;   Patient Active Problem List   Diagnosis Date Noted   Vaccine counseling 12/24/2022   Insomnia 12/24/2022   Fatigue 12/24/2022   Contusion 10/22/2019   Zoster 12/28/2018   Emphysema of lung (HCC) 01/02/2017   Smoker 01/02/2017   Rash and nonspecific skin eruption 12/07/2015   HIV disease (HCC) 03/06/2010    PCP: None  REFERRING PROVIDER: Karyl Paget D, PA  REFERRING DIAG: neck pain  THERAPY DIAG:  Cervicalgia  Chronic right shoulder pain  Pain in right arm  Muscle weakness (generalized)  Rationale for Evaluation and Treatment:  Rehabilitation  ONSET DATE: Chronic since 2021   SUBJECTIVE SUBJECTIVE STATEMENT: Pt reports that he is feeling pretty good this morning but his pain remains variable overall.  EVAL: Patient reports in 2021 he had an accident where he had surgery, then when he got home his neck, right shoulder, chest, and upper back hurt. Then about 4 years later (2025) the pain in the right neck, shoulder, and arm started hurting again with no mechanism. He was given a steroid shot and prescription. He did have rotator cuff surgery on the left side and the right shoulder started feeling the same. The doctor told him he may have a pinched nerve in the right side. He also notes some pain in his legs that came on with his neck pain as well. He reports that currently when he has pain it is mainly on the right side from the right neck, shoulder, arm, and even into the leg. He also wakes up hurting so is wondering if how he sleeps affects his pain. Very rarely his right hand will tingle but denies much numbness. When his right shoulder is bothering him he has trouble lifting it. He did have an MRI on his neck yesterday and follows up with his provider on 12/03/2023.  Hand dominance: Right  PERTINENT HISTORY:  See PMH above  PAIN:  Are you having pain? Yes:  NPRS scale: 3/10 currently, 6-8/10 at worst Pain location: Right neck, shoulder, arm, upper back Pain description: Aching, tired, sharp Aggravating factors: Sitting, turning head to right Relieving factors: Medication  PRECAUTIONS: None  RED FLAGS: None    WEIGHT BEARING RESTRICTIONS: No  FALLS:  Has patient fallen in last 6 months? No  OCCUPATION: Out of work, oversee and operating machines for paint, forklift driving, stacking cans  PLOF: Independent  PATIENT GOALS: Pain relief so he can return to work   OBJECTIVE:  Note: Objective measures were completed at Evaluation unless otherwise noted. PATIENT SURVEYS:  NDI 24/50 (48%) 12/25/2023:  16/50 (32%)  COGNITION: Overall cognitive status: Within functional limits for tasks assessed  SENSATION: WFL  POSTURE:   Rounded shoulder and forward head  PALPATION: Tender to palpation right upper trap region, cervical paraspinals and suboccipitals,    CERVICAL ROM:   Active ROM A/PROM (deg) eval 4/8   Flexion 55   Extension 40   Right lateral flexion 25   Left lateral flexion 45   Right rotation 50 70  Left rotation 55 70   (Blank rows = not tested)  UPPER EXTREMITY ROM:   UE ROM grossly WFL  UPPER EXTREMITY MMT:  MMT Right eval Left eval  Shoulder flexion 4 5  Shoulder extension 5 5  Shoulder abduction 4 5  Shoulder adduction    Shoulder extension    Shoulder internal rotation 5 5  Shoulder external rotation 5 5  Middle trapezius 4- 4-  Lower trapezius 4- 4-  Elbow flexion    Elbow extension    Wrist flexion    Wrist extension    Wrist ulnar deviation    Wrist radial deviation    Wrist pronation    Wrist supination    Grip strength     (Blank rows = not tested)  CERVICAL SPECIAL TESTS:  Spurling's positive on right  FUNCTIONAL TESTS:  DNF endurance: 9 seconds   TREATMENT [facility] Adult PT Treatment:                                                DATE: 01/02/2024 Therapeutic Exercise:  UBE lvl 3 x 4 min (fwd/bwd) for functional endurance Corner pec stretch - 2x 45'' Supine chin tuck x 10 - 5" hold With lift 2x to fatigue ~40" Median nerve glide  Manual Therapy  Lateral glide mid and lower cx spine with median nerve tension  Mechanical traction - 20 lbs  PATIENT EDUCATION:  Education details: HEP update Person educated: Patient Education method: Explanation, Demonstration, Tactile cues, Verbal cues, and Handouts Education comprehension: verbalized understanding, returned demonstration, verbal cues required, tactile cues required, and needs further education  HOME EXERCISE PROGRAM:  Access Code: [email or web address] URL:  [email or web address] Date: 01/02/2024 Prepared by: Lesleigh Rash  Exercises - Supine Chin Tuck  - 1 x daily - 7 x weekly - 2 sets - 10 reps - 3 sec hold - Shoulder External Rotation and Scapular Retraction with Resistance  - 1 x daily - 7 x weekly - 2-3 sets - 10 reps - green band hold - Supine Deep Neck Flexor Training - Hold  - 1 x daily - 7 x weekly - 1 sets - 3 reps - 30-45 secondsond hold - Median Nerve Flossing - Tray  - 2 x daily - 7 x weekly - 3 sets - 10  reps   ASSESSMENT: CLINICAL IMPRESSION: Concentrated on DNF endurance and lower cervical mobility today.  Added in nerve glides for HEP and trialed traction.  Will concentrate on cervical mobility for the next few visits and may consider manipulation of CT or Tx spine.  EVAL: Patient is a 43 y.o. male who was seen today for physical therapy evaluation and treatment primarily for chronic right sided neck, shoulder, and arm pain. He also notes occasional back, chest, and LE pain. Currently his symptoms do seem consistent with right cervical radiculopathic pain. He exhibits limitations with his cervical motion and with increased muscular tension on right side. Postural deviations with DNF endurance deficit and periscapular strength impairment.   OBJECTIVE IMPAIRMENTS: decreased activity tolerance, decreased ROM, decreased strength, postural dysfunction, and pain.   ACTIVITY LIMITATIONS: lifting, sitting, and sleeping  PARTICIPATION LIMITATIONS: occupation  PERSONAL FACTORS: Past/current experiences and Time since onset of injury/illness/exacerbation are also affecting patient's functional outcome.   REHAB POTENTIAL: Good  CLINICAL DECISION MAKING: Stable/uncomplicated  EVALUATION COMPLEXITY: Low   GOALS: Goals reviewed with patient? Yes  SHORT TERM GOALS: Target date: 12/23/2023  Patient will be I with initial HEP in order to progress with therapy. Baseline: HEP provided at eval Goal status: MET  2.   Patient will demonstrate improvement in cervical rotation >/= 10 deg bilaterally in order to improve mobility and reduce neck pain Baseline: see limitations above Goal status: IN PROGRESS  LONG TERM GOALS: Target date: 01/20/2024  Patient will be I with final HEP to maintain progress from PT. Baseline: HEP provided at eval Goal status: IN PROGRESS  2.  Patient will report </= 24% disability on the NDI in order to indicate improvement in functional status Baseline: 48% 12/25/2023: 16/50 (32%) Goal status: IN PROGRESS  3.  Patient will demonstrate DNF endurance >/= 30 seconds to improve postural control and reduce neck pain Baseline: 9 seconds 4/25: 40 sec Goal status: MET  4.  Patient will report neck and shoulder/arm pain </= 4/10 with all activity in order to reduce functional limitations and return to work Baseline: 6-8/10 Goal status: IN PROGRESS   PLAN: PT FREQUENCY: 1-2x/week  PT DURATION: 8 weeks  PLANNED INTERVENTIONS: 97164- PT Re-evaluation, 97110-Therapeutic exercises, 97530- Therapeutic activity, 97112- Neuromuscular re-education, 97535- Self Care, 16109- Manual therapy, [id number]- Electrical stimulation (unattended), [phone]- Electrical stimulation (manual), 97012- Traction (mechanical), Patient/Family education, Taping, Dry Needling, Joint mobilization, Joint manipulation, Spinal manipulation, Spinal mobilization, Cryotherapy, and Moist heat  PLAN FOR NEXT SESSION: Review HEP and progress PRN, manual/TPDN for right shoulder and neck region, cervical mobs to improve motion and reduce pain, assess neurodynamics, progress postural exercises  Donovan Gallant Duante Arocho PT  01/02/24 8:04 AM

## 2024-01-04 ENCOUNTER — Encounter: Payer: Self-pay | Admitting: Infectious Disease

## 2024-01-04 DIAGNOSIS — E785 Hyperlipidemia, unspecified: Secondary | ICD-10-CM | POA: Insufficient documentation

## 2024-01-04 HISTORY — DX: Hyperlipidemia, unspecified: E78.5
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
## 2024-01-05 ENCOUNTER — Other Ambulatory Visit: Payer: Self-pay

## 2024-01-05 ENCOUNTER — Ambulatory Visit: Payer: 59 | Admitting: Infectious Disease

## 2024-01-05 ENCOUNTER — Encounter: Payer: Self-pay | Admitting: Infectious Disease

## 2024-01-05 VITALS — BP 125/72 | HR 76 | Temp 97.8°F | Ht 69.0 in | Wt 200.0 lb

## 2024-01-05 DIAGNOSIS — B20 Human immunodeficiency virus [HIV] disease: Secondary | ICD-10-CM | POA: Diagnosis not present

## 2024-01-05 DIAGNOSIS — S43422A Sprain of left rotator cuff capsule, initial encounter: Secondary | ICD-10-CM

## 2024-01-05 DIAGNOSIS — M542 Cervicalgia: Secondary | ICD-10-CM

## 2024-01-05 DIAGNOSIS — Z23 Encounter for immunization: Secondary | ICD-10-CM

## 2024-01-05 DIAGNOSIS — M25511 Pain in right shoulder: Secondary | ICD-10-CM

## 2024-01-05 DIAGNOSIS — F17200 Nicotine dependence, unspecified, uncomplicated: Secondary | ICD-10-CM

## 2024-01-05 DIAGNOSIS — E785 Hyperlipidemia, unspecified: Secondary | ICD-10-CM | POA: Diagnosis not present

## 2024-01-05 DIAGNOSIS — M5431 Sciatica, right side: Secondary | ICD-10-CM

## 2024-01-05 DIAGNOSIS — Z7185 Encounter for immunization safety counseling: Secondary | ICD-10-CM

## 2024-01-05 MED ORDER — BICTEGRAVIR-EMTRICITAB-TENOFOV 50-200-25 MG PO TABS
1.0000 | ORAL_TABLET | Freq: Every day | ORAL | 11 refills | Status: DC
  Filled 2024-01-05 – 2024-01-22 (×2): qty 30, 30d supply, fill #0
  Filled 2024-03-01: qty 30, 30d supply, fill #1
  Filled 2024-03-25: qty 30, 30d supply, fill #2
  Filled 2024-04-19: qty 30, 30d supply, fill #3
  Filled 2024-05-25: qty 30, 30d supply, fill #4
  Filled 2024-06-21: qty 30, 30d supply, fill #5

## 2024-01-06 ENCOUNTER — Ambulatory Visit: Admitting: Physical Therapy

## 2024-01-06 ENCOUNTER — Encounter: Payer: Self-pay | Admitting: Physical Therapy

## 2024-01-06 ENCOUNTER — Other Ambulatory Visit (HOSPITAL_COMMUNITY): Payer: Self-pay

## 2024-01-06 DIAGNOSIS — M542 Cervicalgia: Secondary | ICD-10-CM | POA: Diagnosis not present

## 2024-01-06 DIAGNOSIS — M6281 Muscle weakness (generalized): Secondary | ICD-10-CM

## 2024-01-06 DIAGNOSIS — G8929 Other chronic pain: Secondary | ICD-10-CM

## 2024-01-06 DIAGNOSIS — M79601 Pain in right arm: Secondary | ICD-10-CM
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
## 2024-01-07 ENCOUNTER — Ambulatory Visit: Payer: Self-pay | Admitting: Infectious Disease

## 2024-01-08 ENCOUNTER — Ambulatory Visit: Attending: Physician Assistant

## 2024-01-08 DIAGNOSIS — M25511 Pain in right shoulder: Secondary | ICD-10-CM | POA: Insufficient documentation

## 2024-01-08 DIAGNOSIS — M79601 Pain in right arm: Secondary | ICD-10-CM | POA: Insufficient documentation

## 2024-01-08 DIAGNOSIS — G8929 Other chronic pain: Secondary | ICD-10-CM | POA: Insufficient documentation

## 2024-01-08 DIAGNOSIS — M542 Cervicalgia: Secondary | ICD-10-CM | POA: Insufficient documentation

## 2024-01-08 DIAGNOSIS — M6281 Muscle weakness (generalized): Secondary | ICD-10-CM | POA: Insufficient documentation

## 2024-01-08 NOTE — Therapy (Signed)
 OUTPATIENT PHYSICAL THERAPY TREATMENT   Patient Name: Jeremy Harper MRN: [id number] DOB:10-15-1980, 43 y.o., male Today's Date: 01/08/2024   END OF SESSION:  PT End of Session - 01/08/24 0852     Visit Number 12    Number of Visits 17    Date for PT Re-Evaluation 01/20/24    Authorization Type [employer]    PT Start Time 0852    PT Stop Time 0927    PT Time Calculation (min) 35 min    Activity Tolerance Patient tolerated treatment well    Behavior During Therapy [facility] for tasks assessed/performed                  Past Medical History:  Diagnosis Date   AIDS (HCC) 12/07/2015   Emphysema of lung (HCC) 01/02/2017   Fatigue 12/24/2022   Hyperlipidemia 01/04/2024   Insomnia 12/24/2022   Rash and nonspecific skin eruption 12/07/2015   Shoulder injury 2025   Smoker 01/02/2017   Thrush 12/07/2015   Vaccine counseling 12/24/2022   Zoster 12/28/2018   Past Surgical History:  Procedure Laterality Date   LAPAROSCOPY N/A 10/22/2019   Procedure: LAPAROSCOPY DIAGNOSTIC;  Surgeon: Anda Bamberg, MD;  Location: [facility] OR;  Service: General;  Laterality: N/A;   LAPAROTOMY N/A 10/22/2019   Procedure: EXPLORATORY LAPAROTOMY;  Surgeon: Anda Bamberg, MD;  Location: [facility] OR;  Service: General;  Laterality: N/A;   SMALL BOWEL REPAIR N/A 10/22/2019   Procedure: Small Bowel Repair;  Surgeon: Anda Bamberg, MD;  Location: [facility] OR;  Service: General;  Laterality: N/A;   Patient Active Problem List   Diagnosis Date Noted   Hyperlipidemia 01/04/2024   Vaccine counseling 12/24/2022   Insomnia 12/24/2022   Fatigue 12/24/2022   Contusion 10/22/2019   Zoster 12/28/2018   Emphysema of lung (HCC) 01/02/2017   Smoker 01/02/2017   Rash and nonspecific skin eruption 12/07/2015   HIV disease (HCC) 03/06/2010    PCP: None  REFERRING PROVIDER: Karyl Paget D, PA  REFERRING DIAG: neck pain  THERAPY DIAG:  Cervicalgia  Chronic right shoulder pain  Rationale for Evaluation and Treatment:  Rehabilitation  ONSET DATE: Chronic since 2021   SUBJECTIVE SUBJECTIVE STATEMENT: Pt presents to PT with reports of continued discomfort in neck and R shoulder. Has been compliant with HEP.   EVAL: Patient reports in 2021 he had an accident where he had surgery, then when he got home his neck, right shoulder, chest, and upper back hurt. Then about 4 years later (2025) the pain in the right neck, shoulder, and arm started hurting again with no mechanism. He was given a steroid shot and prescription. He did have rotator cuff surgery on the left side and the right shoulder started feeling the same. The doctor told him he may have a pinched nerve in the right side. He also notes some pain in his legs that came on with his neck pain as well. He reports that currently when he has pain it is mainly on the right side from the right neck, shoulder, arm, and even into the leg. He also wakes up hurting so is wondering if how he sleeps affects his pain. Very rarely his right hand will tingle but denies much numbness. When his right shoulder is bothering him he has trouble lifting it. He did have an MRI on his neck yesterday and follows up with his provider on 12/03/2023.  Hand dominance: Right  PERTINENT HISTORY:  See PMH above  PAIN:  Are you  having pain? Yes:  NPRS scale: 3/10 currently, 6-8/10 at worst Pain location: Right neck, shoulder, arm, upper back Pain description: Aching, tired, sharp Aggravating factors: Sitting, turning head to right Relieving factors: Medication  PRECAUTIONS: None  RED FLAGS: None    WEIGHT BEARING RESTRICTIONS: No  FALLS:  Has patient fallen in last 6 months? No  OCCUPATION: Out of work, oversee and operating machines for paint, forklift driving, stacking cans  PLOF: Independent  PATIENT GOALS: Pain relief so he can return to work   OBJECTIVE:  Note: Objective measures were completed at Evaluation unless otherwise noted. PATIENT SURVEYS:  NDI 24/50  (48%) 12/25/2023: 16/50 (32%)  COGNITION: Overall cognitive status: Within functional limits for tasks assessed  SENSATION: WFL  POSTURE:   Rounded shoulder and forward head  PALPATION: Tender to palpation right upper trap region, cervical paraspinals and suboccipitals,    CERVICAL ROM:   Active ROM A/PROM (deg) eval 4/8   Flexion 55   Extension 40   Right lateral flexion 25   Left lateral flexion 45   Right rotation 50 70  Left rotation 55 70   (Blank rows = not tested)  UPPER EXTREMITY ROM:   UE ROM grossly WFL  UPPER EXTREMITY MMT:  MMT Right eval Left eval  Shoulder flexion 4 5  Shoulder extension 5 5  Shoulder abduction 4 5  Shoulder adduction    Shoulder extension    Shoulder internal rotation 5 5  Shoulder external rotation 5 5  Middle trapezius 4- 4-  Lower trapezius 4- 4-  Elbow flexion    Elbow extension    Wrist flexion    Wrist extension    Wrist ulnar deviation    Wrist radial deviation    Wrist pronation    Wrist supination    Grip strength     (Blank rows = not tested)  CERVICAL SPECIAL TESTS:  Spurling's positive on right  FUNCTIONAL TESTS:  DNF endurance: 9 seconds   TREATMENT [facility] Adult PT Treatment:                                                DATE: 01/08/2024 Therapeutic Exercise:  UBE lvl 1.5 x 3 min (fwd/bwd) for functional endurance Standing row 2x12 black band Standing chin tuck against wall x 10 Modalities:  Mechanical traction (cervical) - 30 lbs   HOME EXERCISE PROGRAM:  Access Code: [email or web address] URL: [email or web address] Date: 01/02/2024 Prepared by: Lesleigh Rash  Exercises - Supine Chin Tuck  - 1 x daily - 7 x weekly - 2 sets - 10 reps - 3 sec hold - Shoulder External Rotation and Scapular Retraction with Resistance  - 1 x daily - 7 x weekly - 2-3 sets - 10 reps - green band hold - Supine Deep Neck Flexor Training - Hold  - 1 x daily - 7 x weekly - 1 sets - 3 reps - 30-45 secondsond hold -  Median Nerve Flossing - Tray  - 2 x daily - 7 x weekly - 3 sets - 10 reps   ASSESSMENT: CLINICAL IMPRESSION: Pt reported continued pain today but was able to complete prescribed exercises. Continued mechanical traction as he noted slight relief last session. Strengthening exercises continued to focus on DNF and periscapular strength. PT will continue per POC, however, discussed that we may have maximized benefit and will assess next week  after MD visit on 01/13/24.   EVAL: Patient is a 43 y.o. male who was seen today for physical therapy evaluation and treatment primarily for chronic right sided neck, shoulder, and arm pain. He also notes occasional back, chest, and LE pain. Currently his symptoms do seem consistent with right cervical radiculopathic pain. He exhibits limitations with his cervical motion and with increased muscular tension on right side. Postural deviations with DNF endurance deficit and periscapular strength impairment.   OBJECTIVE IMPAIRMENTS: decreased activity tolerance, decreased ROM, decreased strength, postural dysfunction, and pain.   ACTIVITY LIMITATIONS: lifting, sitting, and sleeping  PARTICIPATION LIMITATIONS: occupation  PERSONAL FACTORS: Past/current experiences and Time since onset of injury/illness/exacerbation are also affecting patient's functional outcome.   REHAB POTENTIAL: Good  CLINICAL DECISION MAKING: Stable/uncomplicated  EVALUATION COMPLEXITY: Low   GOALS: Goals reviewed with patient? Yes  SHORT TERM GOALS: Target date: 12/23/2023  Patient will be I with initial HEP in order to progress with therapy. Baseline: HEP provided at eval Goal status: MET  2.  Patient will demonstrate improvement in cervical rotation >/= 10 deg bilaterally in order to improve mobility and reduce neck pain Baseline: see limitations above Goal status: IN PROGRESS  LONG TERM GOALS: Target date: 01/20/2024  Patient will be I with final HEP to maintain progress from  PT. Baseline: HEP provided at eval Goal status: IN PROGRESS  2.  Patient will report </= 24% disability on the NDI in order to indicate improvement in functional status Baseline: 48% 12/25/2023: 16/50 (32%) Goal status: IN PROGRESS  3.  Patient will demonstrate DNF endurance >/= 30 seconds to improve postural control and reduce neck pain Baseline: 9 seconds 4/25: 40 sec Goal status: MET  4.  Patient will report neck and shoulder/arm pain </= 4/10 with all activity in order to reduce functional limitations and return to work Baseline: 6-8/10 Goal status: IN PROGRESS   PLAN: PT FREQUENCY: 1-2x/week  PT DURATION: 8 weeks  PLANNED INTERVENTIONS: 97164- PT Re-evaluation, 97110-Therapeutic exercises, 97530- Therapeutic activity, 97112- Neuromuscular re-education, 97535- Self Care, 62130- Manual therapy, [id number]- Electrical stimulation (unattended), [phone]- Electrical stimulation (manual), 97012- Traction (mechanical), Patient/Family education, Taping, Dry Needling, Joint mobilization, Joint manipulation, Spinal manipulation, Spinal mobilization, Cryotherapy, and Moist heat  PLAN FOR NEXT SESSION: Review HEP and progress PRN, manual/TPDN for right shoulder and neck region, cervical mobs to improve motion and reduce pain, assess neurodynamics, progress postural exercises  Ivor Mars PT  01/08/24 9:28 AM

## 2024-01-12 ENCOUNTER — Other Ambulatory Visit: Payer: Self-pay

## 2024-01-13 ENCOUNTER — Ambulatory Visit

## 2024-01-13 ENCOUNTER — Other Ambulatory Visit: Payer: Self-pay

## 2024-01-13 ENCOUNTER — Other Ambulatory Visit (HOSPITAL_COMMUNITY): Payer: Self-pay

## 2024-01-13 DIAGNOSIS — M79601 Pain in right arm: Secondary | ICD-10-CM

## 2024-01-13 DIAGNOSIS — M6281 Muscle weakness (generalized): Secondary | ICD-10-CM

## 2024-01-13 DIAGNOSIS — G8929 Other chronic pain: Secondary | ICD-10-CM

## 2024-01-13 DIAGNOSIS — M542 Cervicalgia: Secondary | ICD-10-CM

## 2024-01-13 MED ORDER — ALBUTEROL SULFATE HFA 108 (90 BASE) MCG/ACT IN AERS
1.0000 | INHALATION_SPRAY | RESPIRATORY_TRACT | 5 refills | Status: AC
Start: 2024-01-13 — End: ?
  Filled 2024-01-19: qty 6.7, 17d supply, fill #0

## 2024-01-13 NOTE — Therapy (Signed)
 OUTPATIENT PHYSICAL THERAPY TREATMENT   Patient Name: Jeremy Harper MRN: [id number] DOB:[birth year], 43 y.o., male Today's Date: 01/13/2024   END OF SESSION:  PT End of Session - 01/13/24 0850     Visit Number 13    Number of Visits 17    Date for PT Re-Evaluation 01/20/24    Authorization Type [employer]    PT Start Time 0850    PT Stop Time 0928    PT Time Calculation (min) 38 min    Activity Tolerance Patient tolerated treatment well    Behavior During Therapy [facility] for tasks assessed/performed                   Past Medical History:  Diagnosis Date   AIDS (HCC) 12/07/2015   Emphysema of lung (HCC) 01/02/2017   Fatigue 12/24/2022   Hyperlipidemia 01/04/2024   Insomnia 12/24/2022   Rash and nonspecific skin eruption 12/07/2015   Shoulder injury 2025   Smoker 01/02/2017   Thrush 12/07/2015   Vaccine counseling 12/24/2022   Zoster 12/28/2018   Past Surgical History:  Procedure Laterality Date   LAPAROSCOPY N/A 10/22/2019   Procedure: LAPAROSCOPY DIAGNOSTIC;  Surgeon: Anda Bamberg, MD;  Location: [facility] OR;  Service: General;  Laterality: N/A;   LAPAROTOMY N/A 10/22/2019   Procedure: EXPLORATORY LAPAROTOMY;  Surgeon: Anda Bamberg, MD;  Location: [facility] OR;  Service: General;  Laterality: N/A;   SMALL BOWEL REPAIR N/A 10/22/2019   Procedure: Small Bowel Repair;  Surgeon: Anda Bamberg, MD;  Location: [facility] OR;  Service: General;  Laterality: N/A;   Patient Active Problem List   Diagnosis Date Noted   Hyperlipidemia 01/04/2024   Vaccine counseling 12/24/2022   Insomnia 12/24/2022   Fatigue 12/24/2022   Contusion 10/22/2019   Zoster 12/28/2018   Emphysema of lung (HCC) 01/02/2017   Smoker 01/02/2017   Rash and nonspecific skin eruption 12/07/2015   HIV disease (HCC) 03/06/2010    PCP: None  REFERRING PROVIDER: Karyl Paget D, PA  REFERRING DIAG: neck pain  THERAPY DIAG:  Cervicalgia  Chronic right shoulder pain  Pain in right arm  Muscle weakness  (generalized)  Rationale for Evaluation and Treatment: Rehabilitation  ONSET DATE: Chronic since 2021   SUBJECTIVE SUBJECTIVE STATEMENT: Pt presents to PT with reports of continued neck and arm pain, although slightly less.   EVAL: Patient reports in 2021 he had an accident where he had surgery, then when he got home his neck, right shoulder, chest, and upper back hurt. Then about 4 years later (2025) the pain in the right neck, shoulder, and arm started hurting again with no mechanism. He was given a steroid shot and prescription. He did have rotator cuff surgery on the left side and the right shoulder started feeling the same. The doctor told him he may have a pinched nerve in the right side. He also notes some pain in his legs that came on with his neck pain as well. He reports that currently when he has pain it is mainly on the right side from the right neck, shoulder, arm, and even into the leg. He also wakes up hurting so is wondering if how he sleeps affects his pain. Very rarely his right hand will tingle but denies much numbness. When his right shoulder is bothering him he has trouble lifting it. He did have an MRI on his neck yesterday and follows up with his provider on 12/03/2023.  Hand dominance: Right  PERTINENT HISTORY:  See PMH  above  PAIN:  Are you having pain? Yes:  NPRS scale: 3/10 currently, 6-8/10 at worst Pain location: Right neck, shoulder, arm, upper back Pain description: Aching, tired, sharp Aggravating factors: Sitting, turning head to right Relieving factors: Medication  PRECAUTIONS: None  RED FLAGS: None    WEIGHT BEARING RESTRICTIONS: No  FALLS:  Has patient fallen in last 6 months? No  OCCUPATION: Out of work, oversee and operating machines for paint, forklift driving, stacking cans  PLOF: Independent  PATIENT GOALS: Pain relief so he can return to work   OBJECTIVE:  Note: Objective measures were completed at Evaluation unless otherwise  noted. PATIENT SURVEYS:  NDI 24/50 (48%) 12/25/2023: 16/50 (32%)  COGNITION: Overall cognitive status: Within functional limits for tasks assessed  SENSATION: WFL  POSTURE:   Rounded shoulder and forward head  PALPATION: Tender to palpation right upper trap region, cervical paraspinals and suboccipitals,    CERVICAL ROM:   Active ROM A/PROM (deg) eval 4/8   Flexion 55   Extension 40   Right lateral flexion 25   Left lateral flexion 45   Right rotation 50 70  Left rotation 55 70   (Blank rows = not tested)  UPPER EXTREMITY ROM:   UE ROM grossly WFL  UPPER EXTREMITY MMT:  MMT Right eval Left eval  Shoulder flexion 4 5  Shoulder extension 5 5  Shoulder abduction 4 5  Shoulder adduction    Shoulder extension    Shoulder internal rotation 5 5  Shoulder external rotation 5 5  Middle trapezius 4- 4-  Lower trapezius 4- 4-  Elbow flexion    Elbow extension    Wrist flexion    Wrist extension    Wrist ulnar deviation    Wrist radial deviation    Wrist pronation    Wrist supination    Grip strength     (Blank rows = not tested)  CERVICAL SPECIAL TESTS:  Spurling's positive on right  FUNCTIONAL TESTS:  DNF endurance: 9 seconds   TREATMENT [facility] Adult PT Treatment:                                                DATE: 01/13/2024 Therapeutic Exercise:  UBE lvl 1.5 x 3 min (fwd/bwd) for functional endurance Seated low row 3x10 25# Supine chin tuck 2x10 - 3" hold Supine horizontal abd 2x15 RTB S/L open book x 10 each Seated chin tuck x 10  Standing chin tuck with ER 2x10 YTB Standing open book x 10 each at wall  HOME EXERCISE PROGRAM:  Access Code: [email or web address] URL: [email or web address] Date: 01/02/2024 Prepared by: Lesleigh Rash  Exercises - Supine Chin Tuck  - 1 x daily - 7 x weekly - 2 sets - 10 reps - 3 sec hold - Shoulder External Rotation and Scapular Retraction with Resistance  - 1 x daily - 7 x weekly - 2-3 sets - 10 reps - green  band hold - Supine Deep Neck Flexor Training - Hold  - 1 x daily - 7 x weekly - 1 sets - 3 reps - 30-45 secondsond hold - Median Nerve Flossing - Tray  - 2 x daily - 7 x weekly - 3 sets - 10 reps   ASSESSMENT: CLINICAL IMPRESSION: Pt tolerated treatment fair but was continued to be limited by increased pain with exercise. He has his follow up appointment  with Dr. Hiram Lukes today, we discussed that he may have maximized his benefit with therapy, will await instructions from MD and determine next steps in POC at next visit.   EVAL: Patient is a 43 y.o. male who was seen today for physical therapy evaluation and treatment primarily for chronic right sided neck, shoulder, and arm pain. He also notes occasional back, chest, and LE pain. Currently his symptoms do seem consistent with right cervical radiculopathic pain. He exhibits limitations with his cervical motion and with increased muscular tension on right side. Postural deviations with DNF endurance deficit and periscapular strength impairment.   OBJECTIVE IMPAIRMENTS: decreased activity tolerance, decreased ROM, decreased strength, postural dysfunction, and pain.   ACTIVITY LIMITATIONS: lifting, sitting, and sleeping  PARTICIPATION LIMITATIONS: occupation  PERSONAL FACTORS: Past/current experiences and Time since onset of injury/illness/exacerbation are also affecting patient's functional outcome.   REHAB POTENTIAL: Good  CLINICAL DECISION MAKING: Stable/uncomplicated  EVALUATION COMPLEXITY: Low   GOALS: Goals reviewed with patient? Yes  SHORT TERM GOALS: Target date: 12/23/2023  Patient will be I with initial HEP in order to progress with therapy. Baseline: HEP provided at eval Goal status: MET  2.  Patient will demonstrate improvement in cervical rotation >/= 10 deg bilaterally in order to improve mobility and reduce neck pain Baseline: see limitations above Goal status: IN PROGRESS  LONG TERM GOALS: Target date:  01/20/2024  Patient will be I with final HEP to maintain progress from PT. Baseline: HEP provided at eval Goal status: IN PROGRESS  2.  Patient will report </= 24% disability on the NDI in order to indicate improvement in functional status Baseline: 48% 12/25/2023: 16/50 (32%) Goal status: IN PROGRESS  3.  Patient will demonstrate DNF endurance >/= 30 seconds to improve postural control and reduce neck pain Baseline: 9 seconds 4/25: 40 sec Goal status: MET  4.  Patient will report neck and shoulder/arm pain </= 4/10 with all activity in order to reduce functional limitations and return to work Baseline: 6-8/10 Goal status: IN PROGRESS   PLAN: PT FREQUENCY: 1-2x/week  PT DURATION: 8 weeks  PLANNED INTERVENTIONS: 97164- PT Re-evaluation, 97110-Therapeutic exercises, 97530- Therapeutic activity, 97112- Neuromuscular re-education, 97535- Self Care, 91478- Manual therapy, G0283- Electrical stimulation (unattended), [phone]- Electrical stimulation (manual), 97012- Traction (mechanical), Patient/Family education, Taping, Dry Needling, Joint mobilization, Joint manipulation, Spinal manipulation, Spinal mobilization, Cryotherapy, and Moist heat  PLAN FOR NEXT SESSION: Review HEP and progress PRN, manual/TPDN for right shoulder and neck region, cervical mobs to improve motion and reduce pain, assess neurodynamics, progress postural exercises  Ivor Mars PT  01/13/24 9:28 AM

## 2024-01-15 ENCOUNTER — Ambulatory Visit

## 2024-01-15 DIAGNOSIS — M6281 Muscle weakness (generalized): Secondary | ICD-10-CM

## 2024-01-15 DIAGNOSIS — M542 Cervicalgia: Secondary | ICD-10-CM

## 2024-01-15 DIAGNOSIS — G8929 Other chronic pain: Secondary | ICD-10-CM

## 2024-01-15 DIAGNOSIS — M79601 Pain in right arm: Secondary | ICD-10-CM

## 2024-01-15 NOTE — Therapy (Signed)
 OUTPATIENT PHYSICAL THERAPY TREATMENT/DISCHARGE  PHYSICAL THERAPY DISCHARGE SUMMARY  Visits from Start of Care: 14  Current functional level related to goals / functional outcomes: See goals and objective   Remaining deficits: See goals and objective   Education / Equipment: HEP   Patient agrees to discharge. Patient goals were partially met. Patient is being discharged due to maximized rehab potential.    Patient Name: Jeremy Harper MRN: [id number] DOB:1981-03-06, 43 y.o., male Today's Date: 01/15/2024   END OF SESSION:  PT End of Session - 01/15/24 0848     Visit Number 14    Number of Visits 17    Date for PT Re-Evaluation 01/20/24    Authorization Type [employer]    PT Start Time 0849    Activity Tolerance Patient tolerated treatment well    Behavior During Therapy [facility] for tasks assessed/performed                    Past Medical History:  Diagnosis Date   AIDS (HCC) 12/07/2015   Emphysema of lung (HCC) 01/02/2017   Fatigue 12/24/2022   Hyperlipidemia 01/04/2024   Insomnia 12/24/2022   Rash and nonspecific skin eruption 12/07/2015   Shoulder injury 2025   Smoker 01/02/2017   Thrush 12/07/2015   Vaccine counseling 12/24/2022   Zoster 12/28/2018   Past Surgical History:  Procedure Laterality Date   LAPAROSCOPY N/A 10/22/2019   Procedure: LAPAROSCOPY DIAGNOSTIC;  Surgeon: Anda Bamberg, MD;  Location: [facility] OR;  Service: General;  Laterality: N/A;   LAPAROTOMY N/A 10/22/2019   Procedure: EXPLORATORY LAPAROTOMY;  Surgeon: Anda Bamberg, MD;  Location: [facility] OR;  Service: General;  Laterality: N/A;   SMALL BOWEL REPAIR N/A 10/22/2019   Procedure: Small Bowel Repair;  Surgeon: Anda Bamberg, MD;  Location: [facility] OR;  Service: General;  Laterality: N/A;   Patient Active Problem List   Diagnosis Date Noted   Hyperlipidemia 01/04/2024   Vaccine counseling 12/24/2022   Insomnia 12/24/2022   Fatigue 12/24/2022   Contusion 10/22/2019   Zoster 12/28/2018    Emphysema of lung (HCC) 01/02/2017   Smoker 01/02/2017   Rash and nonspecific skin eruption 12/07/2015   HIV disease (HCC) 03/06/2010    PCP: None  REFERRING PROVIDER: Karyl Paget D, PA  REFERRING DIAG: neck pain  THERAPY DIAG:  Cervicalgia  Chronic right shoulder pain  Pain in right arm  Muscle weakness (generalized)  Rationale for Evaluation and Treatment: Rehabilitation  ONSET DATE: Chronic since 2021   SUBJECTIVE SUBJECTIVE STATEMENT: Pt presents to PT with reports of decrease neck and R UE pain currently but overall is about the same. Had follow up with MD on 5/6 and will be seeing a spine specialist soon to determine next steps as symptoms have continued.   EVAL: Patient reports in 2021 he had an accident where he had surgery, then when he got home his neck, right shoulder, chest, and upper back hurt. Then about 4 years later (2025) the pain in the right neck, shoulder, and arm started hurting again with no mechanism. He was given a steroid shot and prescription. He did have rotator cuff surgery on the left side and the right shoulder started feeling the same. The doctor told him he may have a pinched nerve in the right side. He also notes some pain in his legs that came on with his neck pain as well. He reports that currently when he has pain it is mainly on the right side from the  right neck, shoulder, arm, and even into the leg. He also wakes up hurting so is wondering if how he sleeps affects his pain. Very rarely his right hand will tingle but denies much numbness. When his right shoulder is bothering him he has trouble lifting it. He did have an MRI on his neck yesterday and follows up with his provider on 12/03/2023.  Hand dominance: Right  PERTINENT HISTORY:  See PMH above  PAIN:  Are you having pain? Yes:  NPRS scale: 3/10 currently, 6-8/10 at worst Pain location: Right neck, shoulder, arm, upper back Pain description: Aching, tired, sharp Aggravating  factors: Sitting, turning head to right Relieving factors: Medication  PRECAUTIONS: None  RED FLAGS: None    WEIGHT BEARING RESTRICTIONS: No  FALLS:  Has patient fallen in last 6 months? No  OCCUPATION: Out of work, oversee and operating machines for paint, forklift driving, stacking cans  PLOF: Independent  PATIENT GOALS: Pain relief so he can return to work   OBJECTIVE:  Note: Objective measures were completed at Evaluation unless otherwise noted. PATIENT SURVEYS:  NDI 24/50 (48%) 12/25/2023: 16/50 (32%) 01/15/2024: 23/50 (46%)  COGNITION: Overall cognitive status: Within functional limits for tasks assessed  SENSATION: WFL  POSTURE:   Rounded shoulder and forward head  PALPATION: Tender to palpation right upper trap region, cervical paraspinals and suboccipitals,    CERVICAL ROM:   Active ROM A/PROM (deg) eval AROM 12/16/2023  AROM 01/15/2024  Flexion 55    Extension 40    Right lateral flexion 25    Left lateral flexion 45    Right rotation 50 70 50  Left rotation 55 70 67   (Blank rows = not tested)  UPPER EXTREMITY ROM:   UE ROM grossly WFL  UPPER EXTREMITY MMT:  MMT Right eval Left eval  Shoulder flexion 4 5  Shoulder extension 5 5  Shoulder abduction 4 5  Shoulder adduction    Shoulder extension    Shoulder internal rotation 5 5  Shoulder external rotation 5 5  Middle trapezius 4- 4-  Lower trapezius 4- 4-  Elbow flexion    Elbow extension    Wrist flexion    Wrist extension    Wrist ulnar deviation    Wrist radial deviation    Wrist pronation    Wrist supination    Grip strength     (Blank rows = not tested)  CERVICAL SPECIAL TESTS:  Spurling's positive on right  FUNCTIONAL TESTS:  DNF endurance: 9 seconds 01/02/2024: 40 seconds   TREATMENT [facility] Adult PT Treatment:                                                DATE: 01/15/2024 Supine chin tuck x 10 - 5" hold Supine chin tuck with lift x 30" Seated median nerve glide x 10  R Standing row 2x15 black band Standing ER with GTB at wall x 10 Assessment of tests/measures, goals, and outcomes for discharge  HOME EXERCISE PROGRAM:  Access Code: [email or web address] URL: [email or web address] Date: 01/15/2024 Prepared by: Loral Roch  Exercises - Supine Chin Tuck  - 3 x weekly - 2 sets - 10 reps - 3 sec hold - Supine Deep Neck Flexor Training - Hold  - 3 x weekly - 2-3 sets - 30 sec hold - Median Nerve Flossing - Tray  - 3 x weekly -  3 sets - 10 reps - Standing Shoulder Row with Anchored Resistance  - 3 x weekly - 3 sets - 15 reps - black band hold - Shoulder External Rotation and Scapular Retraction with Resistance  - 3 x weekly - 2-3 sets - 10 reps - green band hold   ASSESSMENT: CLINICAL IMPRESSION: Pt was able to complete prescribed exercises and demonstrated knowledge of HEP. Over the course of PT treatment he had some initial improvement in status and cervical ROM but unfortunately pain has returned to previous intensity and NDI disability increased from progress note to today. He will soon get second opinion from spine specialist and has maximized current rehab potential. Pt in agreement with current plan and is ready to discharge from therapy at this time.   EVAL: Patient is a 43 y.o. male who was seen today for physical therapy evaluation and treatment primarily for chronic right sided neck, shoulder, and arm pain. He also notes occasional back, chest, and LE pain. Currently his symptoms do seem consistent with right cervical radiculopathic pain. He exhibits limitations with his cervical motion and with increased muscular tension on right side. Postural deviations with DNF endurance deficit and periscapular strength impairment.   OBJECTIVE IMPAIRMENTS: decreased activity tolerance, decreased ROM, decreased strength, postural dysfunction, and pain.   ACTIVITY LIMITATIONS: lifting, sitting, and sleeping  PARTICIPATION LIMITATIONS: occupation  PERSONAL  FACTORS: Past/current experiences and Time since onset of injury/illness/exacerbation are also affecting patient's functional outcome.   REHAB POTENTIAL: Good  CLINICAL DECISION MAKING: Stable/uncomplicated  EVALUATION COMPLEXITY: Low   GOALS: Goals reviewed with patient? Yes  SHORT TERM GOALS: Target date: 12/23/2023  Patient will be I with initial HEP in order to progress with therapy. Baseline: HEP provided at eval Goal status: MET  2.  Patient will demonstrate improvement in cervical rotation >/= 10 deg bilaterally in order to improve mobility and reduce neck pain Baseline: see limitations above Goal status: IN PROGRESS  LONG TERM GOALS: Target date: 01/20/2024  Patient will be I with final HEP to maintain progress from PT. Baseline: HEP provided at eval Goal status: MET  2.  Patient will report </= 24% disability on the NDI in order to indicate improvement in functional status Baseline: 48% 12/25/2023: 16/50 (32%) 01/15/2024: 23/50 (46%) Goal status: NOT MET  3.  Patient will demonstrate DNF endurance >/= 30 seconds to improve postural control and reduce neck pain Baseline: 9 seconds 4/25: 40 sec Goal status: MET  4.  Patient will report neck and shoulder/arm pain </= 4/10 with all activity in order to reduce functional limitations and return to work Baseline: 6-8/10 Goal status: NOT MET   PLAN: PT FREQUENCY: 1-2x/week  PT DURATION: 8 weeks  PLANNED INTERVENTIONS: 97164- PT Re-evaluation, 97110-Therapeutic exercises, 97530- Therapeutic activity, 97112- Neuromuscular re-education, 97535- Self Care, 30865- Manual therapy, G0283- Electrical stimulation (unattended), [phone]- Electrical stimulation (manual), [id number]- Traction (mechanical), Patient/Family education, Taping, Dry Needling, Joint mobilization, Joint manipulation, Spinal manipulation, Spinal mobilization, Cryotherapy, and Moist heat  PLAN FOR NEXT SESSION: Review HEP and progress PRN, manual/TPDN for right  shoulder and neck region, cervical mobs to improve motion and reduce pain, assess neurodynamics, progress postural exercises  Ivor Mars PT  01/15/24 8:49 AM

## 2024-01-19 ENCOUNTER — Other Ambulatory Visit (HOSPITAL_COMMUNITY): Payer: Self-pay

## 2024-01-22 ENCOUNTER — Other Ambulatory Visit: Payer: Self-pay

## 2024-01-22 NOTE — Progress Notes (Signed)
 Specialty Pharmacy Refill Coordination Note  Jeremy Harper is a 43 y.o. male contacted today regarding refills of specialty medication(s) Bictegravir-Emtricitab-Tenofov (BIKTARVY )   Patient requested (Patient-Rptd) Delivery   Delivery date: (Patient-Rptd) 01/29/24   Verified address: (Patient-Rptd) [address],   [id number]   Medication will be filled on 05.21.25.

## 2024-01-26 NOTE — Progress Notes (Signed)
 The 10-year ASCVD risk score (Arnett DK, et al., 2019) is: 2.9%   Values used to calculate the score:     Age: 43 years     Sex: Male     Is Non-Hispanic African American: Yes     Diabetic: No     Tobacco smoker: No     Systolic Blood Pressure: 128 mmHg     Is BP treated: No     HDL Cholesterol: 47 mg/dL     Total Cholesterol: 154 mg/dL  Arlon Bergamo, BSN, RN

## 2024-01-28 ENCOUNTER — Other Ambulatory Visit: Payer: Self-pay

## 2024-01-28 ENCOUNTER — Other Ambulatory Visit (HOSPITAL_COMMUNITY): Payer: Self-pay

## 2024-02-25 ENCOUNTER — Other Ambulatory Visit: Payer: Self-pay

## 2024-02-26 ENCOUNTER — Other Ambulatory Visit: Payer: Self-pay

## 2024-02-28 ENCOUNTER — Encounter (INDEPENDENT_AMBULATORY_CARE_PROVIDER_SITE_OTHER): Payer: Self-pay

## 2024-03-01 ENCOUNTER — Other Ambulatory Visit: Payer: Self-pay

## 2024-03-01 NOTE — Progress Notes (Signed)
 Specialty Pharmacy Refill Coordination Note  Jeremy Harper is a 43 y.o. male contacted today regarding refills of specialty medication(s) Bictegravir-Emtricitab-Tenofov (BIKTARVY )   Patient requested Delivery   Delivery date: 03/03/24   Verified address: [address] [place], [place] [address]   Medication will be filled on 03/02/24.

## 2024-03-02 ENCOUNTER — Other Ambulatory Visit: Payer: Self-pay

## 2024-03-25 ENCOUNTER — Other Ambulatory Visit: Payer: Self-pay

## 2024-03-25 ENCOUNTER — Other Ambulatory Visit (HOSPITAL_COMMUNITY): Payer: Self-pay

## 2024-03-25 NOTE — Progress Notes (Signed)
 Specialty Pharmacy Refill Coordination Note  NELS MUNN is a 43 y.o. male contacted today regarding refills of specialty medication(s) Bictegravir-Emtricitab-Tenofov (BIKTARVY )   Patient requested Delivery   Delivery date: 03/31/24   Verified address: [address] [place], [place] [address]   Medication will be filled on 03/30/24.

## 2024-04-19 ENCOUNTER — Other Ambulatory Visit: Payer: Self-pay

## 2024-04-19 ENCOUNTER — Other Ambulatory Visit (HOSPITAL_COMMUNITY): Payer: Self-pay

## 2024-04-19 ENCOUNTER — Encounter (INDEPENDENT_AMBULATORY_CARE_PROVIDER_SITE_OTHER): Payer: Self-pay

## 2024-04-19 NOTE — Progress Notes (Signed)
 Specialty Pharmacy Refill Coordination Note  MyChart Questionnaire Submission  Jeremy Harper is a 43 y.o. male contacted today regarding refills of specialty medication(s) Biktarvy .  Doses on hand: (Patient-Rptd) 7   Patient requested: (Patient-Rptd) Delivery   Delivery date: 04/21/24   Verified address: (Patient-Rptd) [phone] [place] [address]  Medication will be filled on 04/20/24.

## 2024-05-07 ENCOUNTER — Other Ambulatory Visit: Payer: Self-pay | Admitting: Neurological Surgery

## 2024-05-07 DIAGNOSIS — M5412 Radiculopathy, cervical region: Secondary | ICD-10-CM

## 2024-05-13 ENCOUNTER — Other Ambulatory Visit: Payer: Self-pay

## 2024-05-20 ENCOUNTER — Other Ambulatory Visit (HOSPITAL_COMMUNITY): Payer: Self-pay

## 2024-05-25 ENCOUNTER — Other Ambulatory Visit: Payer: Self-pay | Admitting: Pharmacy Technician

## 2024-05-25 ENCOUNTER — Other Ambulatory Visit: Payer: Self-pay

## 2024-05-25 NOTE — Progress Notes (Signed)
 Specialty Pharmacy Refill Coordination Note  Jeremy Harper is a 43 y.o. male contacted today regarding refills of specialty medication(s) Bictegravir-Emtricitab-Tenofov (BIKTARVY )   Patient requested Delivery   Delivery date: 05/28/24   Verified address: [address]  [place]   Medication will be filled on 05/27/24.

## 2024-05-26 ENCOUNTER — Other Ambulatory Visit: Payer: Self-pay

## 2024-05-27 NOTE — Discharge Instructions (Signed)

## 2024-05-28 ENCOUNTER — Ambulatory Visit
Admission: RE | Admit: 2024-05-28 | Discharge: 2024-05-28 | Disposition: A | Discharge status: Home / Self Care | Source: Ambulatory Visit | Attending: Neurological Surgery | Admitting: Neurological Surgery

## 2024-05-28 DIAGNOSIS — M5412 Radiculopathy, cervical region: Secondary | ICD-10-CM

## 2024-05-28 MED ORDER — ONDANSETRON HCL 4 MG/2ML IJ SOLN: 4.0000 mg | Freq: Once | INTRAMUSCULAR | Status: DC | PRN

## 2024-05-28 MED ORDER — DIAZEPAM 5 MG PO TABS
10.0000 mg | ORAL_TABLET | Freq: Once | ORAL | Status: AC
  Administered 2024-05-28: 5 mg via ORAL

## 2024-05-28 MED ORDER — MEPERIDINE HCL 50 MG/ML IJ SOLN: 50.0000 mg | Freq: Once | INTRAMUSCULAR | Status: DC | PRN

## 2024-05-28 MED ORDER — IOPAMIDOL (ISOVUE-M 300) INJECTION 61%
10.0000 mL | Freq: Once | INTRAMUSCULAR | Status: AC | PRN
  Administered 2024-05-28: 10 mL via INTRATHECAL

## 2024-06-07 ENCOUNTER — Other Ambulatory Visit: Payer: Self-pay

## 2024-06-17 ENCOUNTER — Other Ambulatory Visit: Payer: Self-pay

## 2024-06-21 ENCOUNTER — Other Ambulatory Visit: Payer: Self-pay

## 2024-06-23 ENCOUNTER — Other Ambulatory Visit (HOSPITAL_COMMUNITY): Payer: Self-pay

## 2024-07-08 NOTE — Progress Notes (Signed)
 Subjective:  Chief complaint: follow-up for HIV disease on medications   Patient ID: Jeremy Harper, male    DOB: [birth year], 43 y.o.   MRN: [id number]  HPI  Discussed the use of AI scribe software for clinical note transcription with the patient, who gave verbal consent to proceed.  History of Present Illness   Jeremy Harper is a 43 year old male who presents for follow-up of HIV disease but who also has chronic neck and shoulder pain.  He has experienced chronic neck and shoulder pain since last year, with worsening symptoms towards the end of the year. The shoulder pain began intermittently and worsened, leading to an MRI in January, which did not show significant findings. In February, his neck pain intensified, accompanied by increased discomfort in both the neck and shoulder. He experiences weakness and tingling in the neck and shoulder area. Despite undergoing physical therapy and receiving injections in his neck from a pain management doctor at [facility], he has not found significant relief. A CT scan was performed but did not reveal significant findings. He has been referred between different specialists, including [facility] and [facility], without a definitive resolution. He continues to perform exercises taught in physical therapy.  Regarding his HIV management, he recalls that his last lab results showed a viral load of less than fifty and a CD4 count that is healthy.       Past Medical History:  Diagnosis Date   AIDS (HCC) 12/07/2015   Emphysema of lung (HCC) 01/02/2017   Fatigue 12/24/2022   Hyperlipidemia 01/04/2024   Insomnia 12/24/2022   Rash and nonspecific skin eruption 12/07/2015   Shoulder injury 2025   Smoker 01/02/2017   Thrush 12/07/2015   Vaccine counseling 12/24/2022   Zoster 12/28/2018    Past Surgical History:  Procedure Laterality Date   LAPAROSCOPY N/A 10/22/2019   Procedure: LAPAROSCOPY DIAGNOSTIC;  Surgeon: Paola Dreama SAILOR, MD;  Location: [facility] OR;  Service: General;  Laterality: N/A;   LAPAROTOMY N/A 10/22/2019   Procedure: EXPLORATORY LAPAROTOMY;  Surgeon: Paola Dreama SAILOR, MD;  Location: [facility] OR;  Service: General;  Laterality: N/A;   SMALL BOWEL REPAIR N/A 10/22/2019   Procedure: Small Bowel Repair;  Surgeon: Paola Dreama SAILOR, MD;  Location: [facility] OR;  Service: General;  Laterality: N/A;    No family history on file.    Social History   Socioeconomic History   Marital status: Married    Spouse name: Not on file   Number of children: Not on file   Years of education: Not on file   Highest education level: Not on file  Occupational History   Not on file  Tobacco Use   Smoking status: Former    Types: Cigars, Cigarettes   Smokeless tobacco: Never   Tobacco comments:    Quit cigarettes, smokes 2 black and milds/day - states he is cutting back  Vaping Use   Vaping status: Every Day  Substance and Sexual Activity   Alcohol use: Yes    Alcohol/week: 2.0 standard drinks of alcohol    Types: 2 Standard drinks or equivalent per week    Comment: occasional   Drug use: Not Currently    Frequency: 2.0 times per week    Types: Marijuana    Comment: Smokes marijuana once or twice a week   Sexual activity: Yes    Partners: Female    Birth control/protection: Condom    Comment: declined condoms  Other  Topics Concern   Not on file  Social History Narrative   Not on file   Social Drivers of Health   Financial Resource Strain: Not on file  Food Insecurity: Not on file  Transportation Needs: Not on file  Physical Activity: Not on file  Stress: Not on file  Social Connections: Unknown (01/21/2022)   Received from [facility]   Social Network    Social Network: Not on file    No Known Allergies   Current Outpatient Medications:    acetaminophen  (TYLENOL ) 500 MG tablet, Take 2 tablets (1,000 mg total) by mouth every 6 (six) hours as needed for mild pain., Disp: , Rfl:    albuterol  (VENTOLIN   HFA) 108 (90 Base) MCG/ACT inhaler, Inhale 1-2 puffs into the lungs every 4 (four) hours., Disp: 6.7 g, Rfl: 5   bictegravir-emtricitabine -tenofovir  AF (BIKTARVY ) 50-200-25 MG TABS tablet, TAKE 1 TABLET BY MOUTH DAILY., Disp: 30 tablet, Rfl: 11   ibuprofen  (ADVIL ) 600 MG tablet, Take 1 tablet (600 mg total) by mouth every 6 (six) hours as needed., Disp: 30 tablet, Rfl: 0   tiZANidine  (ZANAFLEX ) 4 MG tablet, Take 1 tablet (4 mg total) by mouth every 8 (eight) hours as needed for muscle spasms. (Patient not taking: Reported on 01/05/2024), Disp: 20 tablet, Rfl: 0   triamcinolone  cream (KENALOG ) 0.1 %, Apply  1 application topically 2 (two) times daily as needed., Disp: 60 g, Rfl: 3   Review of Systems  Constitutional:  Negative for activity change, appetite change, chills, diaphoresis, fatigue, fever and unexpected weight change.  HENT:  Negative for congestion, rhinorrhea, sinus pressure, sneezing, sore throat and trouble swallowing.   Eyes:  Negative for photophobia and visual disturbance.  Respiratory:  Negative for cough, chest tightness, shortness of breath, wheezing and stridor.   Cardiovascular:  Negative for chest pain, palpitations and leg swelling.  Gastrointestinal:  Negative for abdominal distention, abdominal pain, anal bleeding, blood in stool, constipation, diarrhea, nausea and vomiting.  Genitourinary:  Negative for difficulty urinating, dysuria, flank pain and hematuria.  Musculoskeletal:  Negative for arthralgias, back pain, gait problem, joint swelling and myalgias.  Skin:  Negative for color change, pallor, rash and wound.  Neurological:  Negative for dizziness, tremors, weakness and light-headedness.  Hematological:  Negative for adenopathy. Does not bruise/bleed easily.  Psychiatric/Behavioral:  Negative for agitation, behavioral problems, confusion, decreased concentration, dysphoric mood and sleep disturbance.        Objective:   Physical Exam Constitutional:       Appearance: He is well-developed.  HENT:     Head: Normocephalic and atraumatic.  Eyes:     Conjunctiva/sclera: Conjunctivae normal.  Cardiovascular:     Rate and Rhythm: Normal rate and regular rhythm.  Pulmonary:     Effort: Pulmonary effort is normal. No respiratory distress.     Breath sounds: No wheezing.  Abdominal:     General: There is no distension.     Palpations: Abdomen is soft.  Musculoskeletal:        General: No tenderness. Normal range of motion.     Cervical back: Normal range of motion and neck supple.  Skin:    General: Skin is warm and dry.     Coloration: Skin is not pale.     Findings: No erythema or rash.  Neurological:     General: No focal deficit present.     Mental Status: He is alert and oriented to person, place, and time.  Psychiatric:  Mood and Affect: Mood normal.        Behavior: Behavior normal.        Thought Content: Thought content normal.        Judgment: Judgment normal.           Assessment & Plan:   Assessment and Plan    Human immunodeficiency virus (HIV) disease HIV well-controlled with viral load <50 and healthy CD4 count. - Order new labs to monitor HIV status., VL, CD4 - continue Biktarvy  - Discuss statin therapy at next visit due to increased cardiovascular risk.  Neck and shoulder pain Chronic pain with no surgical indication. Physical therapy and injections ineffective. Psychological impact on pain perception discussed. - Continue physical therapy exercises. - pain management   Hyperlipidemia: continue to think about statins and DRAMATIC reduction in MACE in REPREIVE study of ppl with HIV   Vaccine counseling: recomended flu and COVID shots but he declined.      (His neck and shoulder pain are due to a cervical radiculopathy)

## 2024-07-12 ENCOUNTER — Other Ambulatory Visit: Payer: Self-pay

## 2024-07-12 ENCOUNTER — Ambulatory Visit (INDEPENDENT_AMBULATORY_CARE_PROVIDER_SITE_OTHER): Admitting: Infectious Disease

## 2024-07-12 ENCOUNTER — Encounter: Payer: Self-pay | Admitting: Infectious Disease

## 2024-07-12 ENCOUNTER — Other Ambulatory Visit (HOSPITAL_COMMUNITY)
Admission: RE | Admit: 2024-07-12 | Discharge: 2024-07-12 | Disposition: A | Discharge status: Home / Self Care | Source: Ambulatory Visit | Attending: Infectious Disease | Admitting: Infectious Disease

## 2024-07-12 VITALS — BP 120/78 | HR 65 | Temp 97.7°F | Ht 69.0 in | Wt 190.0 lb

## 2024-07-12 DIAGNOSIS — M5412 Radiculopathy, cervical region: Secondary | ICD-10-CM | POA: Diagnosis not present

## 2024-07-12 DIAGNOSIS — E785 Hyperlipidemia, unspecified: Secondary | ICD-10-CM | POA: Insufficient documentation

## 2024-07-12 DIAGNOSIS — Z7185 Encounter for immunization safety counseling: Secondary | ICD-10-CM | POA: Insufficient documentation

## 2024-07-12 DIAGNOSIS — B20 Human immunodeficiency virus [HIV] disease: Secondary | ICD-10-CM

## 2024-07-12 DIAGNOSIS — G8929 Other chronic pain: Secondary | ICD-10-CM | POA: Insufficient documentation

## 2024-07-12 MED ORDER — BICTEGRAVIR-EMTRICITAB-TENOFOV 50-200-25 MG PO TABS
1.0000 | ORAL_TABLET | Freq: Every day | ORAL | 11 refills | Status: AC
  Filled 2024-07-12: qty 30, 30d supply, fill #0
  Filled 2024-08-04: qty 30, 30d supply, fill #1
  Filled 2024-09-01 – 2024-09-03 (×3): qty 30, 30d supply, fill #2
  Filled 2024-09-30: qty 30, 30d supply, fill #3

## 2024-07-12 NOTE — Progress Notes (Signed)
 Specialty Pharmacy Refill Coordination Note  Jeremy Harper is a 43 y.o. male contacted today regarding refills of specialty medication(s) Bictegravir-Emtricitab-Tenofov (BIKTARVY )   Patient requested Delivery   Delivery date: 07/14/24   Verified address: [address]  [name]   Medication will be filled on: 07/13/24

## 2024-07-13 LAB — T-HELPER CELLS (CD4) COUNT (NOT AT ARMC)
CD4 % Helper T Cell: 23 % — ABNORMAL LOW (ref 33–65)
CD4 T Cell Abs: 525 /uL (ref 400–1790)

## 2024-07-13 LAB — URINE CYTOLOGY ANCILLARY ONLY
Chlamydia: NEGATIVE
Comment: NEGATIVE
Comment: NORMAL
Neisseria Gonorrhea: NEGATIVE

## 2024-07-14 LAB — LIPID PANEL
Cholesterol: 164 mg/dL (ref <200)
HDL: 39 mg/dL — ABNORMAL LOW (ref >=40)
LDL Cholesterol (Calc): 110 mg/dL — ABNORMAL HIGH
Non-HDL Cholesterol (Calc): 125 mg/dL (ref <130)
Total CHOL/HDL Ratio: 4.2 (calc) (ref <5.0)
Triglycerides: 65 mg/dL (ref <150)

## 2024-07-14 LAB — HIV-1 RNA QUANT-NO REFLEX-BLD
HIV 1 RNA Quant: 27 {copies}/mL — ABNORMAL HIGH
HIV-1 RNA Quant, Log: 1.43 {Log_copies}/mL — ABNORMAL HIGH

## 2024-07-14 LAB — COMPLETE METABOLIC PANEL WITHOUT GFR
AG Ratio: 1.5 (calc) (ref 1.0–2.5)
ALT: 20 U/L (ref 9–46)
AST: 20 U/L (ref 10–40)
Albumin: 4.4 g/dL (ref 3.6–5.1)
Alkaline phosphatase (APISO): 94 U/L (ref 36–130)
BUN: 10 mg/dL (ref 7–25)
CO2: 26 mmol/L (ref 20–32)
Calcium: 9.4 mg/dL (ref 8.6–10.3)
Chloride: 105 mmol/L (ref 98–110)
Creat: 1.11 mg/dL (ref 0.60–1.29)
Globulin: 2.9 g/dL (ref 1.9–3.7)
Glucose, Bld: 86 mg/dL (ref 65–99)
Potassium: 4.4 mmol/L (ref 3.5–5.3)
Sodium: 139 mmol/L (ref 135–146)
Total Bilirubin: 0.5 mg/dL (ref 0.2–1.2)
Total Protein: 7.3 g/dL (ref 6.1–8.1)

## 2024-07-14 LAB — CBC WITH DIFFERENTIAL/PLATELET
Absolute Lymphocytes: 2727 {cells}/uL (ref 850–3900)
Absolute Monocytes: 410 {cells}/uL (ref 200–950)
Basophils Absolute: 32 {cells}/uL (ref 0–200)
Basophils Relative: 0.6 %
Eosinophils Absolute: 70 {cells}/uL (ref 15–500)
Eosinophils Relative: 1.3 %
HCT: 42.3 % (ref 38.5–50.0)
Hemoglobin: 14.1 g/dL (ref 13.2–17.1)
MCH: 30.5 pg (ref 27.0–33.0)
MCHC: 33.3 g/dL (ref 32.0–36.0)
MCV: 91.4 fL (ref 80.0–100.0)
MPV: 11 fL (ref 7.5–12.5)
Monocytes Relative: 7.6 %
Neutro Abs: 2160 {cells}/uL (ref 1500–7800)
Neutrophils Relative %: 40 %
Platelets: 234 Thousand/uL (ref 140–400)
RBC: 4.63 Million/uL (ref 4.20–5.80)
RDW: 12.9 % (ref 11.0–15.0)
Total Lymphocyte: 50.5 %
WBC: 5.4 Thousand/uL (ref 3.8–10.8)

## 2024-07-14 LAB — RPR: RPR Ser Ql: NONREACTIVE

## 2024-07-26 ENCOUNTER — Other Ambulatory Visit: Payer: Self-pay

## 2024-07-26 NOTE — Progress Notes (Signed)
 Specialty Pharmacy Ongoing Clinical Assessment Note  Jeremy Harper is a 43 y.o. male who is being followed by the specialty pharmacy service for RxSp HIV   Patient's specialty medication(s) reviewed today: Bictegravir-Emtricitab-Tenofov (BIKTARVY )   Missed doses in the last 4 weeks: 0   Patient/Caregiver did not have any additional questions or concerns.   Therapeutic benefit summary: Patient is achieving benefit   Adverse events/side effects summary: No adverse events/side effects   Patient's therapy is appropriate to: Continue    Goals Addressed             This Visit's Progress    Achieve Undetectable HIV Viral Load < 20   Improving    Patient is on track. Patient will maintain adherence.  Recent viral load on 07/12/24 was 27 copies/mL down from 30 copies/mL 7 months ago, patient endorses recent compliance.         Follow up: 12 months  Silvano [name] Specialty Pharmacist

## 2024-08-04 ENCOUNTER — Other Ambulatory Visit (HOSPITAL_COMMUNITY): Payer: Self-pay

## 2024-08-06 ENCOUNTER — Other Ambulatory Visit: Payer: Self-pay

## 2024-08-06 NOTE — Progress Notes (Signed)
 Specialty Pharmacy Refill Coordination Note  Jeremy Harper is a 43 y.o. male contacted today regarding refills of specialty medication(s) Bictegravir-Emtricitab-Tenofov (BIKTARVY )   Patient requested Delivery   Delivery date: 08/11/24   Verified address: [address]  [place]   Medication will be filled on: 08/10/24

## 2024-08-10 ENCOUNTER — Other Ambulatory Visit: Payer: Self-pay

## 2024-08-24 ENCOUNTER — Encounter (INDEPENDENT_AMBULATORY_CARE_PROVIDER_SITE_OTHER): Payer: Self-pay

## 2024-08-24 ENCOUNTER — Other Ambulatory Visit: Payer: Self-pay

## 2024-09-01 ENCOUNTER — Other Ambulatory Visit: Payer: Self-pay

## 2024-09-01 ENCOUNTER — Other Ambulatory Visit (HOSPITAL_COMMUNITY): Payer: Self-pay

## 2024-09-03 ENCOUNTER — Other Ambulatory Visit: Payer: Self-pay

## 2024-09-03 NOTE — Progress Notes (Signed)
 Specialty Pharmacy Refill Coordination Note  Jeremy Harper is a 43 y.o. male contacted today regarding refills of specialty medication(s) Bictegravir-Emtricitab-Tenofov (BIKTARVY )   Patient requested Delivery   Delivery date: 09/08/24   Verified address: [address]  [place], [place] [address]   Medication will be filled on: 09/07/24

## 2024-09-30 ENCOUNTER — Other Ambulatory Visit: Payer: Self-pay

## 2024-10-04 ENCOUNTER — Other Ambulatory Visit: Payer: Self-pay

## 2024-10-04 NOTE — Progress Notes (Signed)
 Specialty Pharmacy Refill Coordination Note  GADDIEL CULLENS is a 44 y.o. male contacted today regarding refills of specialty medication(s) Bictegravir-Emtricitab-Tenofov (BIKTARVY )   Patient requested Delivery   Delivery date: 10/07/24   Verified address: [address]  [place], [place] [address]   Medication will be filled on: 10/06/24

## 2024-10-06 ENCOUNTER — Other Ambulatory Visit: Payer: Self-pay

## 2025-01-10 ENCOUNTER — Ambulatory Visit: Admitting: Infectious Disease
